# Patient Record
Sex: Female | Born: 1990 | Race: White | Hispanic: No | Marital: Single | Smoking: Never smoker
Health system: Southern US, Community
[De-identification: ages and names within clinical notes are randomized; demographics above are authoritative.]

## PROBLEM LIST (undated history)

## (undated) ENCOUNTER — Inpatient Hospital Stay (HOSPITAL_COMMUNITY): Payer: Self-pay

## (undated) DIAGNOSIS — Q5128 Other doubling of uterus, other specified: Secondary | ICD-10-CM

## (undated) DIAGNOSIS — K635 Polyp of colon: Secondary | ICD-10-CM

## (undated) DIAGNOSIS — O139 Gestational [pregnancy-induced] hypertension without significant proteinuria, unspecified trimester: Secondary | ICD-10-CM

## (undated) DIAGNOSIS — O24419 Gestational diabetes mellitus in pregnancy, unspecified control: Secondary | ICD-10-CM

## (undated) HISTORY — DX: Gestational diabetes mellitus in pregnancy, unspecified control: O24.419

## (undated) HISTORY — DX: Polyp of colon: K63.5

## (undated) HISTORY — PX: CERVIX SURGERY: SHX593

---
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
## 2012-04-09 ENCOUNTER — Emergency Department: Payer: Self-pay | Admitting: Emergency Medicine

## 2012-04-17 ENCOUNTER — Ambulatory Visit: Payer: Self-pay | Admitting: Family Medicine

## 2012-04-21 ENCOUNTER — Emergency Department: Payer: Self-pay | Admitting: Emergency Medicine

## 2012-04-21 LAB — COMPREHENSIVE METABOLIC PANEL
Alkaline Phosphatase: 139 U/L (ref 82–169)
BUN: 13 mg/dL (ref 7–18)
Bilirubin,Total: 0.4 mg/dL (ref 0.2–1.0)
Chloride: 107 mmol/L (ref 98–107)
Creatinine: 0.61 mg/dL (ref 0.60–1.30)
EGFR (Non-African Amer.): >60
Glucose: 103 mg/dL — ABNORMAL HIGH (ref 65–99)
Osmolality: 276 (ref 275–301)
Potassium: 3.8 mmol/L (ref 3.5–5.1)
SGOT(AST): 21 U/L (ref 0–26)
Sodium: 138 mmol/L (ref 136–145)

## 2012-04-21 LAB — CBC
MCHC: 34.4 g/dL (ref 32.0–36.0)
Platelet: 379 10*3/uL (ref 150–440)
RDW: 13.7 % (ref 11.5–14.5)

## 2012-04-21 LAB — URINALYSIS, COMPLETE
Bacteria: NONE SEEN
Bilirubin,UR: NEGATIVE
Nitrite: NEGATIVE
Specific Gravity: 1.023 (ref 1.003–1.030)
Squamous Epithelial: 3

## 2012-04-21 LAB — PREGNANCY, URINE: Pregnancy Test, Urine: NEGATIVE m[IU]/mL

## 2012-05-13 ENCOUNTER — Ambulatory Visit: Payer: Self-pay | Admitting: Obstetrics and Gynecology

## 2012-05-13 LAB — CBC
HCT: 38.6 % (ref 35.0–47.0)
MCV: 84 fL (ref 80–100)
Platelet: 326 10*3/uL (ref 150–440)

## 2012-05-13 LAB — PREGNANCY, URINE: Pregnancy Test, Urine: NEGATIVE m[IU]/mL

## 2012-05-20 ENCOUNTER — Ambulatory Visit: Payer: Self-pay | Admitting: Obstetrics and Gynecology

## 2013-03-06 ENCOUNTER — Emergency Department: Payer: Self-pay | Admitting: Emergency Medicine

## 2013-03-31 ENCOUNTER — Ambulatory Visit: Payer: Self-pay | Admitting: Orthopedic Surgery

## 2014-01-17 ENCOUNTER — Emergency Department: Payer: Self-pay | Admitting: Internal Medicine

## 2014-01-17 LAB — URINALYSIS, COMPLETE
BLOOD: NEGATIVE
Bilirubin,UR: NEGATIVE
Glucose,UR: NEGATIVE mg/dL (ref 0–75)
Hyaline Cast: 6
Ketone: NEGATIVE
Leukocyte Esterase: NEGATIVE
NITRITE: NEGATIVE
Ph: 5 (ref 4.5–8.0)
Specific Gravity: 1.015 (ref 1.003–1.030)
Squamous Epithelial: 5
WBC UR: 2 /HPF (ref 0–5)

## 2014-01-18 ENCOUNTER — Emergency Department: Payer: Self-pay | Admitting: Emergency Medicine

## 2014-04-27 ENCOUNTER — Emergency Department: Payer: Self-pay | Admitting: Emergency Medicine

## 2014-04-27 LAB — CBC WITH DIFFERENTIAL/PLATELET
BASOS ABS: 0 10*3/uL (ref 0.0–0.1)
Basophil %: 0.2 %
Eosinophil #: 0 10*3/uL (ref 0.0–0.7)
Eosinophil %: 0.3 %
HCT: 39.1 % (ref 35.0–47.0)
HGB: 13 g/dL (ref 12.0–16.0)
LYMPHS PCT: 21.1 %
Lymphocyte #: 4 10*3/uL — ABNORMAL HIGH (ref 1.0–3.6)
MCH: 29.1 pg (ref 26.0–34.0)
MCHC: 33.2 g/dL (ref 32.0–36.0)
MCV: 88 fL (ref 80–100)
Monocyte #: 1.1 x10 3/mm — ABNORMAL HIGH (ref 0.2–0.9)
Monocyte %: 5.6 %
NEUTROS ABS: 13.9 10*3/uL — AB (ref 1.4–6.5)
NEUTROS PCT: 72.8 %
Platelet: 353 10*3/uL (ref 150–440)
RBC: 4.47 10*6/uL (ref 3.80–5.20)
RDW: 13.2 % (ref 11.5–14.5)
WBC: 19.1 10*3/uL — AB (ref 3.6–11.0)

## 2014-04-27 LAB — URINALYSIS, COMPLETE
BILIRUBIN, UR: NEGATIVE
Glucose,UR: NEGATIVE mg/dL (ref 0–75)
Ketone: NEGATIVE
NITRITE: NEGATIVE
Ph: 7 (ref 4.5–8.0)
Protein: NEGATIVE
RBC,UR: 9 /HPF (ref 0–5)
SPECIFIC GRAVITY: 1.003 (ref 1.003–1.030)
WBC UR: 79 /HPF (ref 0–5)

## 2014-04-27 LAB — COMPREHENSIVE METABOLIC PANEL
ALBUMIN: 3.5 g/dL (ref 3.4–5.0)
ALK PHOS: 109 U/L
Anion Gap: 16 (ref 7–16)
BUN: 9 mg/dL (ref 7–18)
Bilirubin,Total: 0.3 mg/dL (ref 0.2–1.0)
CREATININE: 0.72 mg/dL (ref 0.60–1.30)
Calcium, Total: 9 mg/dL (ref 8.5–10.1)
Chloride: 107 mmol/L (ref 98–107)
Co2: 24 mmol/L (ref 21–32)
EGFR (African American): >60
Glucose: 74 mg/dL (ref 65–99)
Osmolality: 290 (ref 275–301)
Potassium: 3.9 mmol/L (ref 3.5–5.1)
SGOT(AST): 19 U/L (ref 15–37)
SGPT (ALT): 28 U/L
Sodium: 147 mmol/L — ABNORMAL HIGH (ref 136–145)
Total Protein: 7.8 g/dL (ref 6.4–8.2)

## 2014-07-21 IMAGING — US TRANSABDOMINAL ULTRASOUND OF PELVIS
1 series · 13 of 25 positions shown · non-contrast
Comparison: none

REASON FOR EXAM: pelvic pain
COMMENTS:

[Series 1: transabdominal ultrasound of pelvis · 0.23mm/px · 13 of 82 slices shown]
[im 1/82]
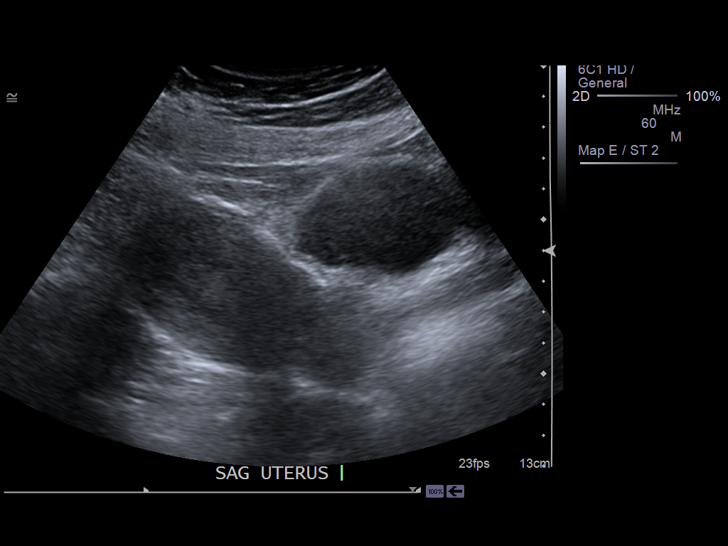
[im 7/82]
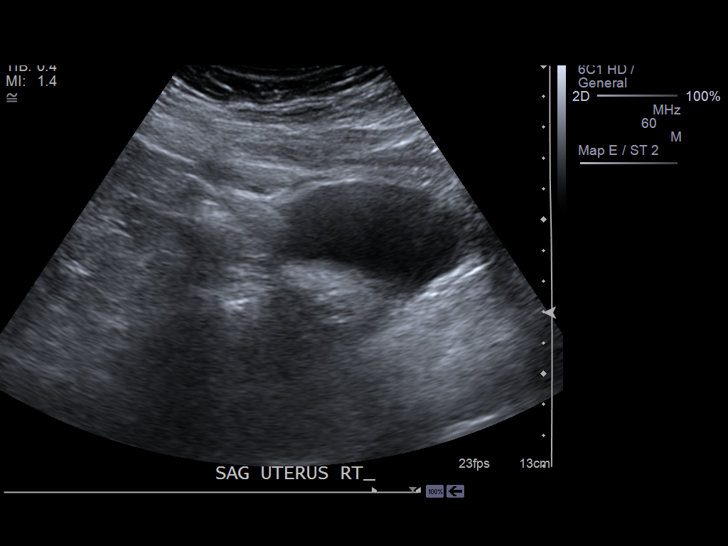
[im 14/82]
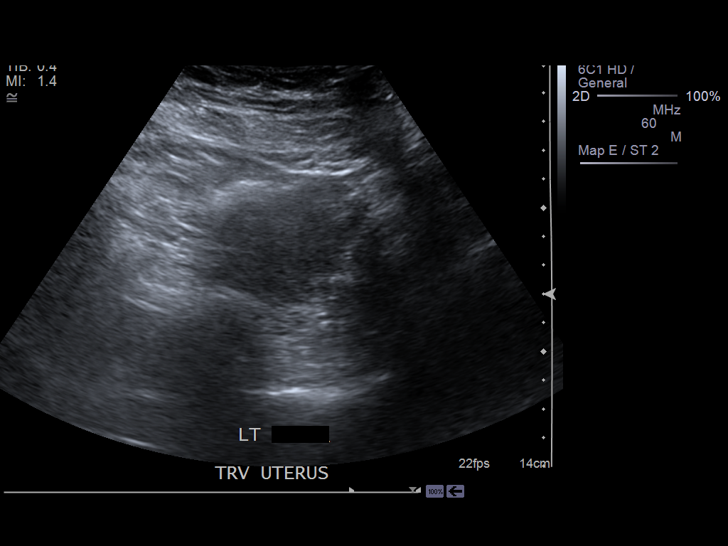
[im 21/82]
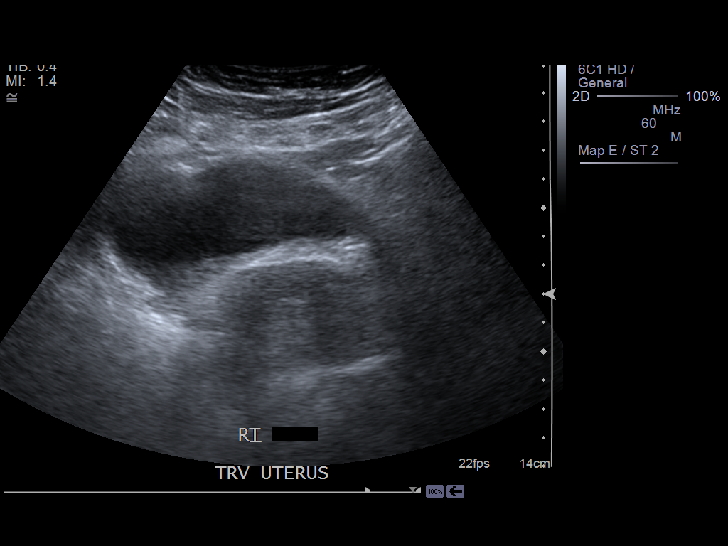
[im 28/82]
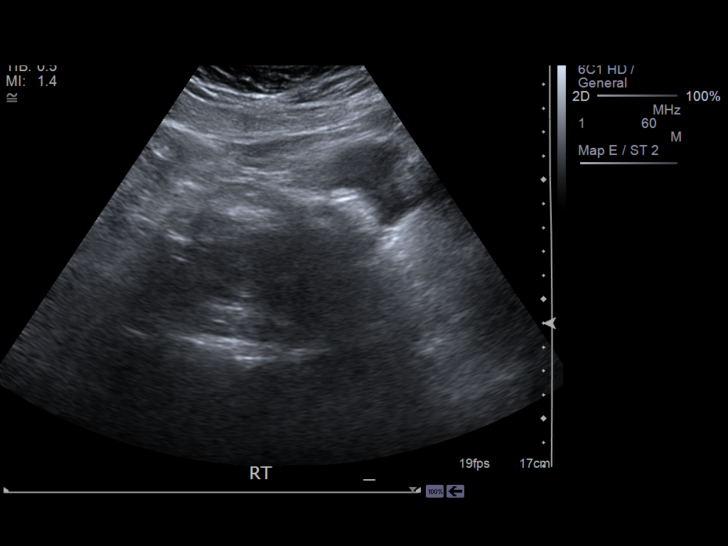
[im 34/82]
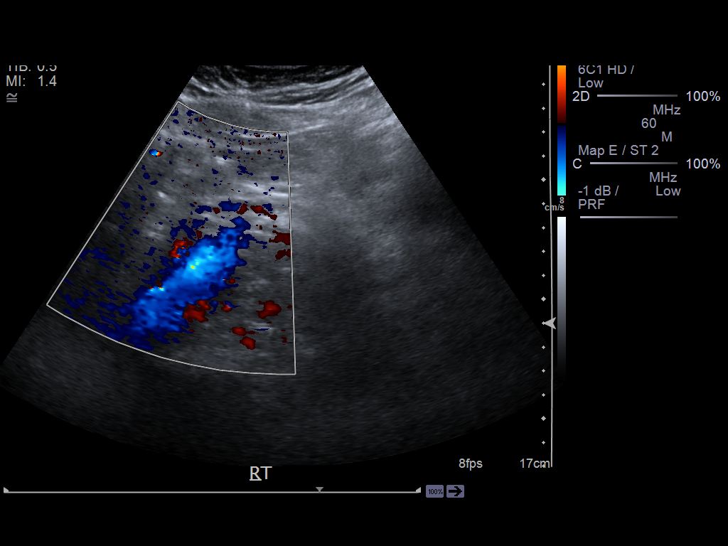
[im 41/82]
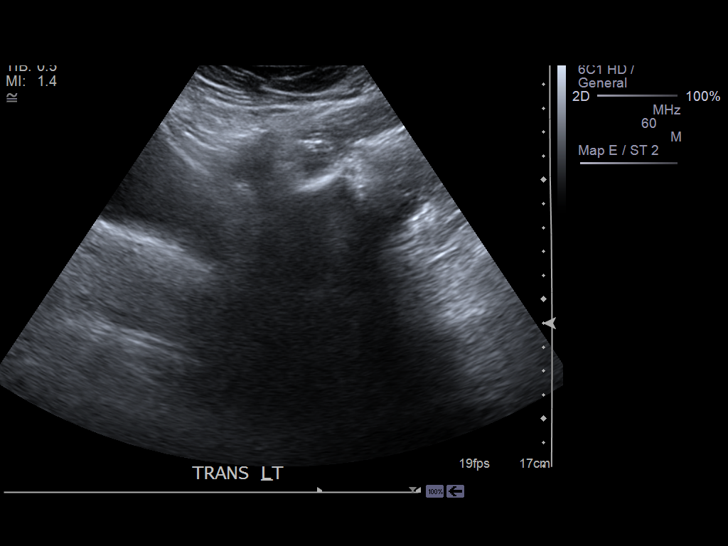
[im 48/82]
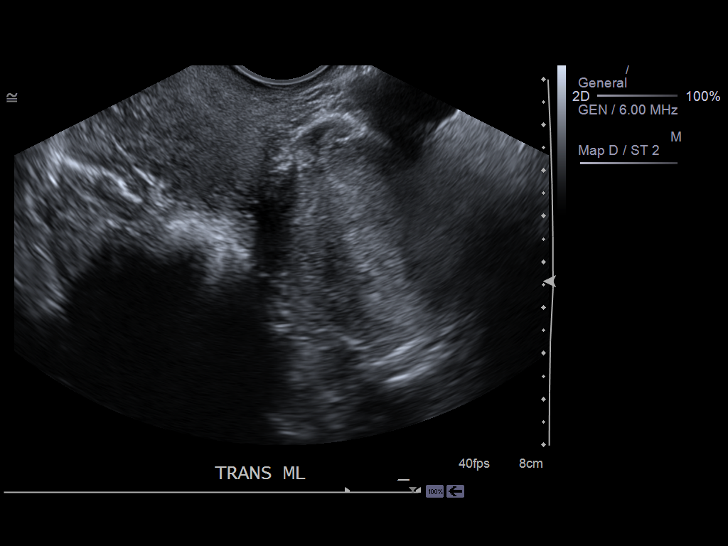
[im 55/82]
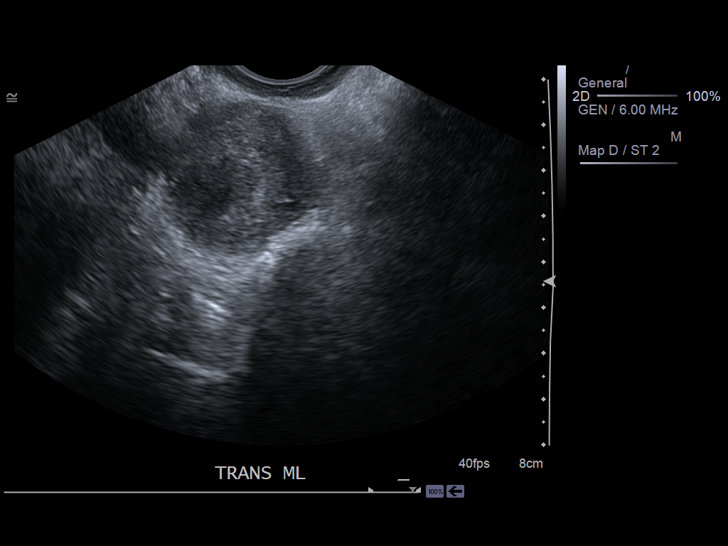
[im 61/82]
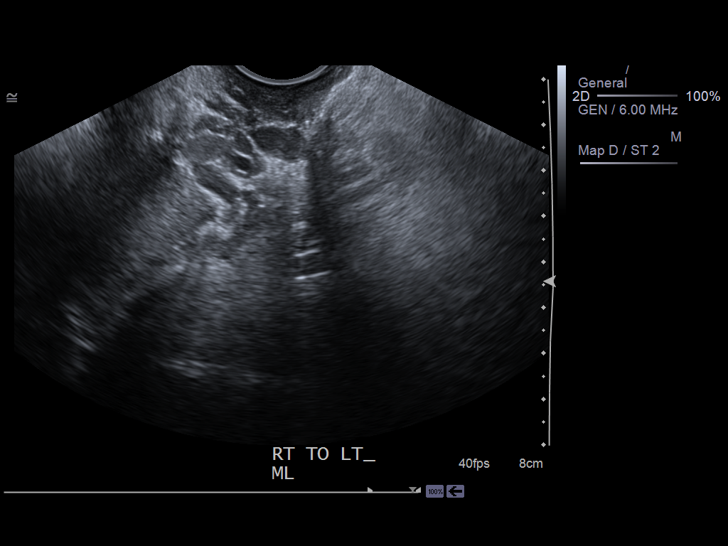
[im 68/82]
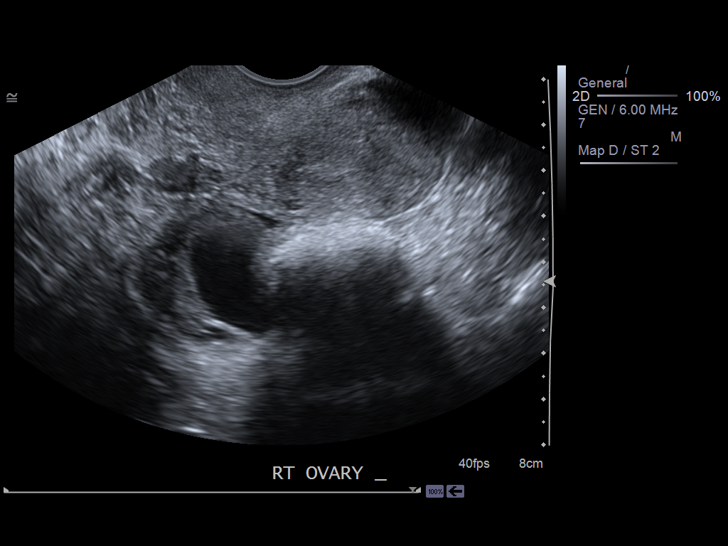
[im 75/82]
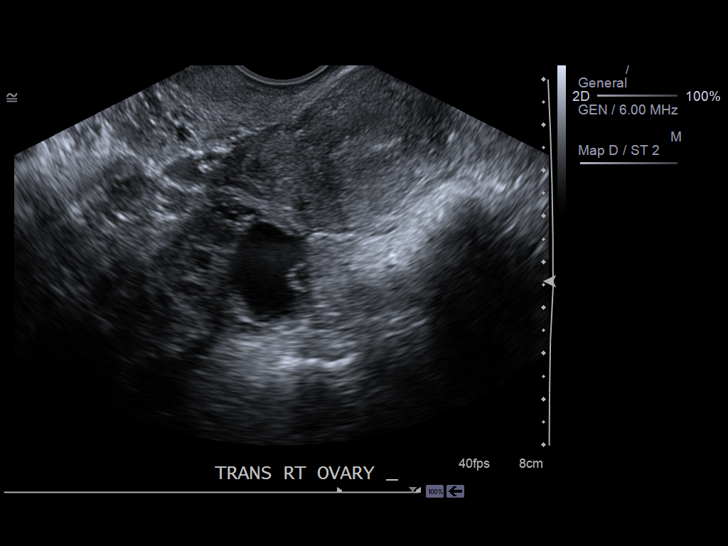
[im 82/82]
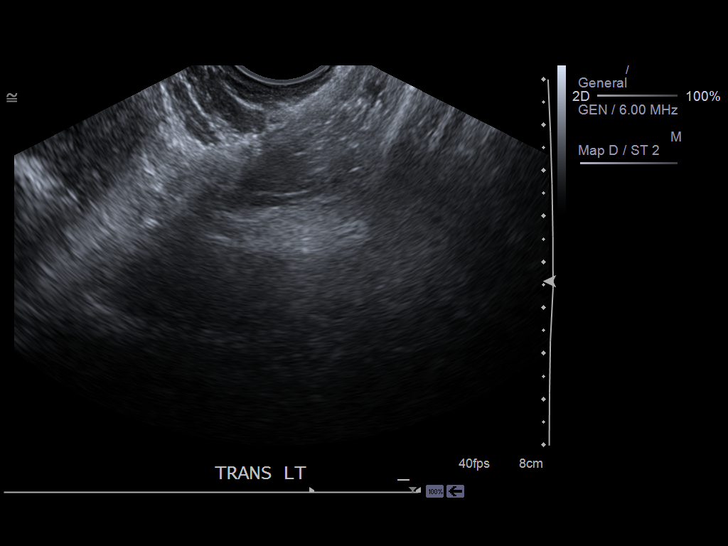

[13 of 25 positions shown; findings below may reference images not displayed]

PROCEDURE:     KUONG - KUONG PELVIS NON-OB W/TRANSVAGINAL  - April 17, 2012  [DATE]

RESULT:     Transabdominal and transvaginal imaging was performed. The
patient has a history of duplication of the uterus consistent with a uterine
didelphys by history.

The uterine horn on the right measures 8.6 x 6 x 5.7 cm. On the left it
measures 8.6 x 4.8 x 4.6 cm. The endometrial stripe measures 9.1 mm in
thickness. There is no free fluid in the cul-de-sac.

The left ovary could not be demonstrated. The right ovary measures 4.8 x
x 2.9 cm and contains a dominant cystic structure measuring 2 x 2.6 by
cm. This cystic structure does not appear hypervascular.
IMPRESSION: 1. The study is limited due to the patient's body habitus inability to
tolerate the transvaginal exam. The patient reportedly has duplication of
the uterus and 2 uterine horns are visible on this ultrasound. Only one
endometrial stripe that on the right, could be demonstrated it measured 9 mm.
2. The left ovary could not be demonstrated. The right ovary demonstrates a
cyst or developing follicle which measures 2.6 cm in greatest dimension.

[REDACTED]

## 2014-08-22 NOTE — Op Note (Signed)
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   PROCEDURE: Primary LUT cesarean section on the left uterus.    SURGEON: Delsa Sale, MD  ASSISTANT: Dr. Donzetta Matters   ESTIMATED BLOOD LOSS: 1000 mL.   FINDINGS: Term liveborn female infant weighing 7 pounds, 9 ounces with two distinct uteruses fused on the superficial outer aspect with two normal ovaries and tubes bilaterally.  DESCRIPTION OF PROCEDURE: Patient was taken to the Operating Room, placed in supine position. After adequate spinal anesthesia instilled, the patient was prepped and draped in the usual sterile fashion. Incision was drawn on the patient's abdomen. Timeout was performed. Skin incision was made in the Pfannenstiel fashion approximately three fingerbreadths above the pubic symphysis and carried sharply down to the fascia. Fascia was nicked in the midline and the incision was extended in a superolateral manner with the curved Mayo scissors. The superior fascia was grasped with Kocher clamps and the muscle bellies were sharply and bluntly dissected off the rectus muscles. In similar fashion the inferior muscle bellies and fascial were dissected off the muscle bellies. The muscle bellies were split in the midline. Peritoneum was grasped, sharply entered, incision extended with the Metzenbaum scissors. Bladder blade was placed. At this time abdominal survey was performed with the surgeon's hand to find orientation of the uterus, the ovaries, the tubes and was found to have a small right-sided uterus of approximately 4 to 6 inches adherent to the sidewall of the larger uterus encompassing the baby. At this time patient was known that there were two cervices.  The uterus was shifted towards the patient's left and the lower uterine segment of the right uterine horn was entered in the lower uterine segment. The incision was extended with bandage scissors in a superolateral manner so as to avoid the left uterus. The infant's head was identified. The amniotic sac was ruptured. The infant's head was delivered. Infant was bulb suctioned and the infant's cord was clamped and cut, handed off to the awaiting pediatrician. Cord blood was obtained. Pitocin was started. Placenta was delivered. Photographs were taken of the uterus to show the patient and to place in her chart. The patient's uterus was then grasped at the incision edges with pennington clamps. A small area of abrasion on the left uterine horn was closed with a running locked chromic suture. The uterine incision was then closed with a running locked chromic and an imbricating chromic, 2-0 chromic was used to tack the bladder flap back up to the uterine incision. The belly was cleared of clots and irrigated. The uterus was placed back into the abdomen. The clots in the gutters were removed. Seprafilm was placed across the uterine incision. The muscle bellies were approximated with a Vicryl suture in a running fashion. The On-Q pain pump was then placed through the superior fascia and muscle belly. The uterine incision was closed in one running Vicryl suture. Subcutaneous fat stitch of plain gut suture was then placed to approximate Camper's and Scarpa's fascia and skin clips were placed on the skin to approximate the skin edges. The On-Q pain pump was then primed. 2 x 2's and 4 x 4's were then used to cover the skin where the catheters were wound.. Tegaderm was placed.  Patient was then laid supine with clear urine noted in the Foley bag.   ____________________________ Delsa Sale, MD cck:cms D: 05/31/2011 23:44:09 ET T: 06/01/2011 11:33:28 ET JOB#: [id number]  cc: Delsa Sale, MD, <Dictator> Delsa Sale  MD ELECTRONICALLY SIGNED 06/05/2011 14:14
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
## 2014-09-07 NOTE — H&P (Signed)
L&D Evaluation:  History:   HPI 24 yo G1 whoae EDC = 06/07/11.  Pt scheduled cfor a primary CS 39 wks for a uterus didelphus.  patient presents after a ground-level fall where she hit her abdomen on her leg at about 720pm.  She notes +FM, lower abdominal pain and back pain, no vb, no lof.    Patient's Medical History No Chronic Illness    Patient's Surgical History none    Medications Pre Natal Vitamins    Allergies NKDA    Social History none   ROS:   ROS All systems were reviewed.  HEENT, CNS, GI, GU, Respiratory, CV, Renal and Musculoskeletal systems were found to be normal.   Exam:   Vital Signs stable    General no apparent distress    Mental Status clear    Chest clear    Heart normal sinus rhythm    Abdomen gravid, mildly tender to palpation all along lower abdomen. no rebound or guarding    Estimated Fetal Weight Average for gestational age    Pelvic Nitrazine neg    Mebranes Intact    FHT normal rate with no decels    FHT Description 140/mod var/+accels/no decels    Ucx absent   Impression:   Impression ground-level fall with abdomen hitting leg   Plan:   Plan EFM/NST, monitor contractions and for cervical change    Comments Monitored patient until 6 hours after her fall.  The fetal tracing was reactive. She is discharged home with precautions and instructions to follow up with her appointment later today.    Follow Up Appointment already scheduled   Electronic Signatures: Will Bonnet (MD)  (Signed 17-Jan-13 07:18)  Authored: L&D Evaluation   Last Updated: 17-Jan-13 07:18 by Will Bonnet (MD)

## 2015-02-03 ENCOUNTER — Ambulatory Visit: Admission: RE | Admit: 2015-02-03 | Payer: Self-pay | Source: Ambulatory Visit | Admitting: *Deleted

## 2016-04-21 ENCOUNTER — Emergency Department: Payer: Self-pay

## 2016-04-21 ENCOUNTER — Inpatient Hospital Stay
Admission: EM | Admit: 2016-04-21 | Discharge: 2016-04-22 | DRG: 418 | Disposition: A | Discharge status: Home / Self Care | Payer: Self-pay | Attending: Surgery | Admitting: Surgery

## 2016-04-21 ENCOUNTER — Encounter: Payer: Self-pay | Admitting: Emergency Medicine

## 2016-04-21 DIAGNOSIS — Z6841 Body Mass Index (BMI) 40.0 and over, adult: Secondary | ICD-10-CM

## 2016-04-21 DIAGNOSIS — K8000 Calculus of gallbladder with acute cholecystitis without obstruction: Secondary | ICD-10-CM

## 2016-04-21 DIAGNOSIS — K819 Cholecystitis, unspecified: Secondary | ICD-10-CM

## 2016-04-21 HISTORY — DX: Calculus of gallbladder with acute cholecystitis without obstruction: K80.00

## 2016-04-21 LAB — COMPREHENSIVE METABOLIC PANEL
ALK PHOS: 92 U/L (ref 38–126)
ALT: 30 U/L (ref 14–54)
AST: 36 U/L (ref 15–41)
Albumin: 4 g/dL (ref 3.5–5.0)
Anion gap: 8 (ref 5–15)
BUN: 9 mg/dL (ref 6–20)
CALCIUM: 9 mg/dL (ref 8.9–10.3)
CO2: 24 mmol/L (ref 22–32)
CREATININE: 0.53 mg/dL (ref 0.44–1.00)
Chloride: 105 mmol/L (ref 101–111)
Glucose, Bld: 101 mg/dL — ABNORMAL HIGH (ref 65–99)
Potassium: 3.8 mmol/L (ref 3.5–5.1)
Sodium: 137 mmol/L (ref 135–145)
Total Bilirubin: 0.7 mg/dL (ref 0.3–1.2)
Total Protein: 7.6 g/dL (ref 6.5–8.1)

## 2016-04-21 LAB — URINALYSIS, COMPLETE (UACMP) WITH MICROSCOPIC
Bilirubin Urine: NEGATIVE
GLUCOSE, UA: NEGATIVE mg/dL
Hgb urine dipstick: NEGATIVE
KETONES UR: NEGATIVE mg/dL
Leukocytes, UA: NEGATIVE
Nitrite: NEGATIVE
Protein, ur: NEGATIVE mg/dL
SPECIFIC GRAVITY, URINE: 1.017 (ref 1.005–1.030)
pH: 5 (ref 5.0–8.0)

## 2016-04-21 LAB — CBC
HCT: 37 % (ref 35.0–47.0)
Hemoglobin: 12.8 g/dL (ref 12.0–16.0)
MCH: 29.7 pg (ref 26.0–34.0)
MCHC: 34.7 g/dL (ref 32.0–36.0)
MCV: 85.7 fL (ref 80.0–100.0)
PLATELETS: 405 10*3/uL (ref 150–440)
RBC: 4.32 MIL/uL (ref 3.80–5.20)
RDW: 13.4 % (ref 11.5–14.5)
WBC: 14.2 10*3/uL — AB (ref 3.6–11.0)

## 2016-04-21 LAB — LIPASE, BLOOD: LIPASE: 16 U/L (ref 11–51)

## 2016-04-21 LAB — POCT PREGNANCY, URINE: Preg Test, Ur: NEGATIVE

## 2016-04-21 MED ORDER — LACTATED RINGERS IV SOLN
125.0000 mL/h | INTRAVENOUS | Status: DC
  Administered 2016-04-22 (×2): 125 mL/h via INTRAVENOUS
  Administered 2016-04-22: 08:00:00 via INTRAVENOUS

## 2016-04-21 MED ORDER — ONDANSETRON HCL 4 MG/2ML IJ SOLN
4.0000 mg | Freq: Once | INTRAMUSCULAR | Status: AC | PRN
  Administered 2016-04-21: 4 mg via INTRAVENOUS
  Filled 2016-04-21: qty 2

## 2016-04-21 MED ORDER — ONDANSETRON HCL 4 MG/2ML IJ SOLN
4.0000 mg | Freq: Four times a day (QID) | INTRAMUSCULAR | Status: DC | PRN
  Administered 2016-04-22 (×2): 4 mg via INTRAVENOUS
  Filled 2016-04-21: qty 2

## 2016-04-21 MED ORDER — KETOROLAC TROMETHAMINE 30 MG/ML IJ SOLN
30.0000 mg | Freq: Four times a day (QID) | INTRAMUSCULAR | Status: DC
  Administered 2016-04-22 (×5): 30 mg via INTRAVENOUS
  Filled 2016-04-21 (×4): qty 1

## 2016-04-21 MED ORDER — MORPHINE SULFATE (PF) 4 MG/ML IV SOLN
4.0000 mg | Freq: Once | INTRAVENOUS | Status: AC
  Administered 2016-04-21: 4 mg via INTRAVENOUS
  Filled 2016-04-21: qty 1

## 2016-04-21 MED ORDER — ENOXAPARIN SODIUM 40 MG/0.4ML ~~LOC~~ SOLN
40.0000 mg | SUBCUTANEOUS | Status: DC
  Administered 2016-04-22: 40 mg via SUBCUTANEOUS
  Filled 2016-04-21 (×2): qty 0.4

## 2016-04-21 MED ORDER — POLYETHYLENE GLYCOL 3350 17 G PO PACK: 17.0000 g | PACK | Freq: Every day | ORAL | Status: DC | PRN

## 2016-04-21 MED ORDER — OXYCODONE HCL 5 MG PO TABS
5.0000 mg | ORAL_TABLET | ORAL | Status: DC | PRN
  Administered 2016-04-22 (×2): 5 mg via ORAL
  Filled 2016-04-21 (×2): qty 1

## 2016-04-21 MED ORDER — SODIUM CHLORIDE 0.9 % IV BOLUS (SEPSIS)
1000.0000 mL | Freq: Once | INTRAVENOUS | Status: AC
  Administered 2016-04-22: 1000 mL via INTRAVENOUS

## 2016-04-21 MED ORDER — HYDROMORPHONE HCL 1 MG/ML IJ SOLN
0.5000 mg | INTRAMUSCULAR | Status: DC | PRN
  Administered 2016-04-22: 0.5 mg via INTRAVENOUS
  Filled 2016-04-21: qty 0.5

## 2016-04-21 MED ORDER — PANTOPRAZOLE SODIUM 40 MG IV SOLR
40.0000 mg | Freq: Every day | INTRAVENOUS | Status: DC
  Administered 2016-04-22: 40 mg via INTRAVENOUS
  Filled 2016-04-21 (×2): qty 40

## 2016-04-21 MED ORDER — ACETAMINOPHEN 500 MG PO TABS
1000.0000 mg | ORAL_TABLET | Freq: Four times a day (QID) | ORAL | Status: DC
  Administered 2016-04-22 (×3): 1000 mg via ORAL
  Filled 2016-04-21 (×4): qty 2

## 2016-04-21 MED ORDER — ONDANSETRON HCL 4 MG/2ML IJ SOLN
4.0000 mg | Freq: Once | INTRAMUSCULAR | Status: AC
  Administered 2016-04-21: 4 mg via INTRAVENOUS
  Filled 2016-04-21: qty 2

## 2016-04-21 MED ORDER — DEXTROSE 5 % IV SOLN
2.0000 g | Freq: Three times a day (TID) | INTRAVENOUS | Status: DC
  Administered 2016-04-22 (×4): 2 g via INTRAVENOUS
  Filled 2016-04-21 (×5): qty 2

## 2016-04-21 MED ORDER — ONDANSETRON 4 MG PO TBDP: 4.0000 mg | ORAL_TABLET | Freq: Four times a day (QID) | ORAL | Status: DC | PRN

## 2016-04-21 NOTE — ED Triage Notes (Signed)
Pt sates she has had RUQ abdominal pain for the past year but states the pain has worsened over the past several days and worse today. Pt states she has abdominal pain after meals and describes the pain as sharp and stabbing.  Pt states she was woken up by the pain.  Pt endorses nausea and vomiting x1, denies diarrhea.  Pain worse when lying down and radiates to back.

## 2016-04-21 NOTE — ED Notes (Signed)
Pt returned from US

## 2016-04-21 NOTE — ED Provider Notes (Signed)
[facility] Emergency Department Provider Note  Time seen: 8:12 PM  I have reviewed the triage vital signs and the nursing notes.   HISTORY  Chief Complaint Abdominal Pain    HPI Martha Potts is a 24 y.o. female who presents to the emergency department for intermittent right upper quadrant pain. According to the patient for the past 5 years she's been having intermittent right upper quadrant pain. States the episodes have been much more frequent this year, and fairly constant for the past one week. Patient states the pain is worse when eating or shortly after eating. States nausea, one episode of vomiting today. Denies diarrhea. Denies fever.Currently describes the pain as a 4 or 5/10 in the right upper quadrant, sharp pain.  History reviewed. No pertinent past medical history.  There are no active problems to display for this patient.   Past Surgical History:  Procedure Laterality Date  . CERVIX SURGERY    . CESAREAN SECTION      Prior to Admission medications   Not on File    No Known Allergies  History reviewed. No pertinent family history.  Social History Social History  Substance Use Topics  . Smoking status: Never Smoker  . Smokeless tobacco: Never Used  . Alcohol use No    Review of Systems Constitutional: Negative for fever. Cardiovascular: Negative for chest pain. Respiratory: Negative for shortness of breath. Gastrointestinal: Positive for right upper quadrant pain. Positive for nausea or vomiting. Negative for diarrhea. Neurological: Negative for headache 10-point ROS otherwise negative.  ____________________________________________   PHYSICAL EXAM:  VITAL SIGNS: ED Triage Vitals  Enc Vitals Group     BP 04/21/16 1840 (!) 114/56     Pulse Rate 04/21/16 1840 85     Resp 04/21/16 1840 20     Temp 04/21/16 1840 98.1 F (36.7 C)     Temp Source 04/21/16 1840 Oral     SpO2 04/21/16 1840 98 %     Weight 04/21/16 1834  (!) 315 lb (142.9 kg)     Height 04/21/16 1834 5\' 5"  (1.651 m)     Head Circumference --      Peak Flow --      Pain Score 04/21/16 1834 9     Pain Loc --      Pain Edu? --      Excl. in [place]? --     Constitutional: Alert and oriented. Well appearing and in no distress. Eyes: Normal exam ENT   Head: Normocephalic and atraumatic   Mouth/Throat: Mucous membranes are moist. Cardiovascular: Normal rate, regular rhythm. No murmur Respiratory: Normal respiratory effort without tachypnea nor retractions. Breath sounds are clear  Gastrointestinal: Soft, minimal right upper quadrant tenderness palpation, otherwise benign abdomen. Obese. Musculoskeletal: Nontender with normal range of motion in all extremities.  Neurologic:  Normal speech and language. No gross focal neurologic deficits  Skin:  Skin is warm, dry and intact.  Psychiatric: Mood and affect are normal.   ____________________________________________    EKG  EKG reviewed and interpreted by myself shows normal sinus rhythm at 87 bpm, narrow QRS, normal axis, normal intervals, no concerning ST changes. Normal EKG.  ____________________________________________    RADIOLOGY  Ultrasound shows innumerable gallstones.  ____________________________________________   INITIAL IMPRESSION / ASSESSMENT AND PLAN / ED COURSE  Pertinent labs & imaging results that were available during my care of the patient were reviewed by me and considered in my medical decision making (see chart for details).  Patient presents  the emergency department with intermittent right upper quadrant pain for the past 5 years, worse over the last 1 year, more constant over the past one week. Patient's labs are largely within normal limits besides a leukocytosis of 14,000. LFTs are normal. Lipase is normal. We'll obtain a right upper quadrant ultrasound to further evaluate. Patient's symptoms are very suggestive of biliary colic.  Many gallstones on the  ultrasound. Given the patient's elevated white blood cell count of 14,000 with continued right upper quadrant discomfort I have consult to Gen. surgery will be down to see the patient.  Gen. surgery has seen the patient and will be admitting him for presumed cholecystitis given her elevated white blood cell count and continued upper abdominal pain.  ____________________________________________   FINAL CLINICAL IMPRESSION(S) / ED DIAGNOSES  Right upper quadrant pain Biliary colic Cholecystitis   Harvest Dark, MD 04/21/16 2315

## 2016-04-21 NOTE — H&P (Signed)
Date of Admission:  04/21/2016  Reason for Admission:  Acute cholecystitis  History of Present Illness: Martha Potts is a 25 y.o. female who presents with a one-day history of persistent right upper quadrant and epigastric abdominal pain. The patient reports that she has had very frequent intermittent episodes of right upper quadrant pain over the course of this week associated with nausea and pain that radiates towards the back. Today however the pain was much more persistent with none to minimal time of relief. The pain was worse with any oral intake and the patient has had a lot of nausea and did have an episode of emesis today. The patient denies any fevers but reports having some chills today. Denies having any chest pain or shortness of breath. Denies other areas of abdominal pain. This reports having some alternating diarrhea and constipation but reports that this is something that she's been dealing with for a long time.  Of note the patient reports that she's had similar type of pain over the course of 5 years which would be occasional only and short lived during each episode. However over the course of the years the episodes have become more frequent and particularly this year they have become more frequent progressing all the way to today.  Past Medical History: History reviewed. No pertinent past medical history.   Past Surgical History: Past Surgical History:  Procedure Laterality Date  . CERVIX SURGERY    . CESAREAN SECTION      Home Medications: Prior to Admission medications   None    Allergies: No Known Allergies  Social History:  reports that she has never smoked. She has never used smokeless tobacco. She reports that she does not drink alcohol or use drugs.   Family History: History reviewed. No pertinent family history.  Review of Systems: Review of Systems  Constitutional: Positive for chills. Negative for fever.  HENT: Negative for hearing loss.    Eyes: Negative for blurred vision.  Respiratory: Negative for cough and shortness of breath.   Cardiovascular: Negative for chest pain and leg swelling.  Gastrointestinal: Positive for abdominal pain, nausea and vomiting. Negative for constipation, diarrhea and heartburn.  Genitourinary: Negative for dysuria and hematuria.  Musculoskeletal: Negative for myalgias.  Skin: Negative for rash.  Neurological: Positive for focal weakness. Negative for dizziness.  Psychiatric/Behavioral: Negative for depression.    Physical Exam BP 138/88 (BP Location: Left Wrist)   Pulse 79   Temp 98.1 F (36.7 C) (Oral)   Resp 18   Ht 5\' 5"  (1.651 m)   Wt (!) 142.9 kg (315 lb)   LMP 03/30/2016 (Approximate) Comment: Irregular periods due to implant  SpO2 99%   BMI 52.42 kg/m  CONSTITUTIONAL: No acute distress. HEENT:  Normocephalic, atraumatic, extraocular motion intact. NECK: Trachea is midline, and there is no jugular venous distension. RESPIRATORY:  Lungs are clear, and breath sounds are equal bilaterally. Normal respiratory effort without pathologic use of accessory muscles. CARDIOVASCULAR: Heart is regular without murmurs, gallops, or rubs. GI: The abdomen is soft, obese, non-distended, with tenderness to palpation in the right upper quadrant and epigastric area.  Positive Murphy's sign. MUSCULOSKELETAL:  Normal muscle strength and tone in all four extremities.  No peripheral edema or cyanosis. SKIN: Skin turgor is normal. There are no pathologic skin lesions.  NEUROLOGIC:  Motor and sensation is grossly normal.  Cranial nerves are grossly intact. PSYCH:  Alert and oriented to person, place and time. Affect is normal.  Laboratory Analysis: Results  for orders placed or performed during the hospital encounter of 04/21/16 (from the past 24 hour(s))  Lipase, blood     Status: None   Collection Time: 04/21/16  6:43 PM  Result Value Ref Range   Lipase 16 11 - 51 U/L  Comprehensive metabolic panel      Status: Abnormal   Collection Time: 04/21/16  6:43 PM  Result Value Ref Range   Sodium 137 135 - 145 mmol/L   Potassium 3.8 3.5 - 5.1 mmol/L   Chloride 105 101 - 111 mmol/L   CO2 24 22 - 32 mmol/L   Glucose, Bld 101 (H) 65 - 99 mg/dL   BUN 9 6 - 20 mg/dL   Creatinine, Ser 0.53 0.44 - 1.00 mg/dL   Calcium 9.0 8.9 - 10.3 mg/dL   Total Protein 7.6 6.5 - 8.1 g/dL   Albumin 4.0 3.5 - 5.0 g/dL   AST 36 15 - 41 U/L   ALT 30 14 - 54 U/L   Alkaline Phosphatase 92 38 - 126 U/L   Total Bilirubin 0.7 0.3 - 1.2 mg/dL   GFR calc non Af Amer >60 >60 mL/min   GFR calc Af Amer >60 >60 mL/min   Anion gap 8 5 - 15  CBC     Status: Abnormal   Collection Time: 04/21/16  6:43 PM  Result Value Ref Range   WBC 14.2 (H) 3.6 - 11.0 K/uL   RBC 4.32 3.80 - 5.20 MIL/uL   Hemoglobin 12.8 12.0 - 16.0 g/dL   HCT 37.0 35.0 - 47.0 %   MCV 85.7 80.0 - 100.0 fL   MCH 29.7 26.0 - 34.0 pg   MCHC 34.7 32.0 - 36.0 g/dL   RDW 13.4 11.5 - 14.5 %   Platelets 405 150 - 440 K/uL  Urinalysis, Complete w Microscopic     Status: Abnormal   Collection Time: 04/21/16  6:43 PM  Result Value Ref Range   Color, Urine YELLOW (A) YELLOW   APPearance HAZY (A) CLEAR   Specific Gravity, Urine 1.017 1.005 - 1.030   pH 5.0 5.0 - 8.0   Glucose, UA NEGATIVE NEGATIVE mg/dL   Hgb urine dipstick NEGATIVE NEGATIVE   Bilirubin Urine NEGATIVE NEGATIVE   Ketones, ur NEGATIVE NEGATIVE mg/dL   Protein, ur NEGATIVE NEGATIVE mg/dL   Nitrite NEGATIVE NEGATIVE   Leukocytes, UA NEGATIVE NEGATIVE   RBC / HPF 0-5 0 - 5 RBC/hpf   WBC, UA 0-5 0 - 5 WBC/hpf   Bacteria, UA RARE (A) NONE SEEN   Squamous Epithelial / LPF 0-5 (A) NONE SEEN   Mucous PRESENT   Pregnancy, urine POC     Status: None   Collection Time: 04/21/16  7:02 PM  Result Value Ref Range   Preg Test, Ur NEGATIVE NEGATIVE    Imaging: US Abdomen Limited Ruq  Result Date: 04/21/2016 CLINICAL DATA:  Right upper quadrant pain for 1 year which has worsened over the past  several days. EXAM: US ABDOMEN LIMITED - RIGHT UPPER QUADRANT COMPARISON:  None. FINDINGS: Gallbladder: The gallbladder is contracted with innumerable small stones identified measuring up to 0.5 cm. Most of the stones are not visible due to shadowing. No gallbladder wall thickening or pericholecystic fluid. Sonographer reports negative Murphy's sign. Common bile duct: Diameter: 0.2 cm. Liver: Fatty infiltration without focal lesion or intrahepatic biliary ductal dilatation. IMPRESSION: Innumerable stones within a contracted gallbladder. No ultrasound evidence of acute cholecystitis. Fatty infiltration of the liver. Electronically Signed   By: Marcello Moores  Dalessio M.D.   On: 04/21/2016 21:40    Assessment and Plan: This is a 25 y.o. female who presents with likely acute on chronic cholecystitis. I have personally reviewed the patient's ultrasound and laboratory studies. She does have a white blood cell count of 14.2 and ultrasound with a contracted gallbladder that is full of stones. On exam she does have a positive Murphy's sign.  -We'll admit the patient to the general surgery service. -Start IV antibiotics, IV fluids, and nausea/pain medications. -We'll keep the patient nothing by mouth in anticipation for possible surgery tomorrow with my partner Dr. Adonis Huguenin. Have discussed with the patient the role of laparoscopic versus open cholecystectomy. -The patient understands this plan and all of her questions have been answered.   Melvyn Neth, [place]

## 2016-04-22 ENCOUNTER — Inpatient Hospital Stay: Payer: Self-pay | Admitting: Anesthesiology

## 2016-04-22 ENCOUNTER — Encounter
Admission: EM | Disposition: A | Discharge status: Home / Self Care | Payer: Self-pay | Source: Home / Self Care | Attending: Surgery

## 2016-04-22 DIAGNOSIS — K8000 Calculus of gallbladder with acute cholecystitis without obstruction: Principal | ICD-10-CM

## 2016-04-22 HISTORY — PX: CHOLECYSTECTOMY: SHX55

## 2016-04-22 LAB — CBC
HCT: 35.6 % (ref 35.0–47.0)
Hemoglobin: 12 g/dL (ref 12.0–16.0)
MCH: 28.9 pg (ref 26.0–34.0)
MCHC: 33.8 g/dL (ref 32.0–36.0)
MCV: 85.4 fL (ref 80.0–100.0)
PLATELETS: 349 10*3/uL (ref 150–440)
RBC: 4.16 MIL/uL (ref 3.80–5.20)
RDW: 13.4 % (ref 11.5–14.5)
WBC: 11.1 10*3/uL — ABNORMAL HIGH (ref 3.6–11.0)

## 2016-04-22 LAB — BASIC METABOLIC PANEL
Anion gap: 6 (ref 5–15)
BUN: 10 mg/dL (ref 6–20)
CALCIUM: 8.3 mg/dL — AB (ref 8.9–10.3)
CO2: 23 mmol/L (ref 22–32)
CREATININE: 0.59 mg/dL (ref 0.44–1.00)
Chloride: 108 mmol/L (ref 101–111)
GFR calc Af Amer: >60 mL/min (ref >60)
GFR calc non Af Amer: >60 mL/min (ref >60)
GLUCOSE: 86 mg/dL (ref 65–99)
Potassium: 3.7 mmol/L (ref 3.5–5.1)
Sodium: 137 mmol/L (ref 135–145)

## 2016-04-22 LAB — SURGICAL PCR SCREEN
MRSA, PCR: NEGATIVE
Staphylococcus aureus: NEGATIVE

## 2016-04-22 LAB — MAGNESIUM: Magnesium: 1.8 mg/dL (ref 1.7–2.4)

## 2016-04-22 SURGERY — LAPAROSCOPIC CHOLECYSTECTOMY WITH INTRAOPERATIVE CHOLANGIOGRAM
Anesthesia: General | Wound class: Clean Contaminated

## 2016-04-22 MED ORDER — ONDANSETRON HCL 4 MG/2ML IJ SOLN: 4.0000 mg | Freq: Once | INTRAMUSCULAR | Status: DC | PRN

## 2016-04-22 MED ORDER — PROPOFOL 10 MG/ML IV BOLUS
INTRAVENOUS | Status: AC
Start: 2016-04-22 — End: 2016-04-22
  Filled 2016-04-22: qty 20

## 2016-04-22 MED ORDER — LIDOCAINE 2% (20 MG/ML) 5 ML SYRINGE
INTRAMUSCULAR | Status: AC
  Filled 2016-04-22: qty 5

## 2016-04-22 MED ORDER — FENTANYL CITRATE (PF) 100 MCG/2ML IJ SOLN
25.0000 ug | INTRAMUSCULAR | Status: DC | PRN
  Administered 2016-04-22 (×2): 25 ug via INTRAVENOUS

## 2016-04-22 MED ORDER — ROCURONIUM BROMIDE 50 MG/5ML IV SOSY
PREFILLED_SYRINGE | INTRAVENOUS | Status: AC
  Filled 2016-04-22: qty 5

## 2016-04-22 MED ORDER — ENOXAPARIN SODIUM 40 MG/0.4ML ~~LOC~~ SOLN
40.0000 mg | Freq: Two times a day (BID) | SUBCUTANEOUS | Status: DC
  Filled 2016-04-22: qty 0.4

## 2016-04-22 MED ORDER — LIDOCAINE HCL (CARDIAC) 20 MG/ML IV SOLN
INTRAVENOUS | Status: DC | PRN
  Administered 2016-04-22: 100 mg via INTRAVENOUS

## 2016-04-22 MED ORDER — SUCCINYLCHOLINE CHLORIDE 200 MG/10ML IV SOSY
PREFILLED_SYRINGE | INTRAVENOUS | Status: AC
  Filled 2016-04-22: qty 10

## 2016-04-22 MED ORDER — ROCURONIUM BROMIDE 100 MG/10ML IV SOLN
INTRAVENOUS | Status: DC | PRN
Start: 2016-04-22 — End: 2016-04-22
  Administered 2016-04-22: 50 mg via INTRAVENOUS

## 2016-04-22 MED ORDER — FENTANYL CITRATE (PF) 250 MCG/5ML IJ SOLN
INTRAMUSCULAR | Status: AC
  Filled 2016-04-22: qty 10

## 2016-04-22 MED ORDER — EPHEDRINE 5 MG/ML INJ
INTRAVENOUS | Status: AC
  Filled 2016-04-22: qty 10

## 2016-04-22 MED ORDER — DEXAMETHASONE SODIUM PHOSPHATE 10 MG/ML IJ SOLN
INTRAMUSCULAR | Status: DC | PRN
  Administered 2016-04-22: 20 mg via INTRAVENOUS

## 2016-04-22 MED ORDER — SUCCINYLCHOLINE CHLORIDE 20 MG/ML IJ SOLN
INTRAMUSCULAR | Status: DC | PRN
  Administered 2016-04-22: 140 mg via INTRAVENOUS
  Administered 2016-04-22: 60 mg via INTRAVENOUS

## 2016-04-22 MED ORDER — DEXAMETHASONE SODIUM PHOSPHATE 10 MG/ML IJ SOLN
INTRAMUSCULAR | Status: AC
  Filled 2016-04-22: qty 2

## 2016-04-22 MED ORDER — SUGAMMADEX SODIUM 500 MG/5ML IV SOLN
INTRAVENOUS | Status: AC
  Filled 2016-04-22: qty 5

## 2016-04-22 MED ORDER — ACETAMINOPHEN 10 MG/ML IV SOLN
INTRAVENOUS | Status: DC | PRN
  Administered 2016-04-22: 1000 mg via INTRAVENOUS

## 2016-04-22 MED ORDER — FENTANYL CITRATE (PF) 100 MCG/2ML IJ SOLN
INTRAMUSCULAR | Status: AC
  Administered 2016-04-22: 25 ug via INTRAVENOUS
  Filled 2016-04-22: qty 2

## 2016-04-22 MED ORDER — FENTANYL CITRATE (PF) 100 MCG/2ML IJ SOLN
INTRAMUSCULAR | Status: DC | PRN
  Administered 2016-04-22: 50 ug via INTRAVENOUS
  Administered 2016-04-22: 100 ug via INTRAVENOUS
  Administered 2016-04-22 (×2): 50 ug via INTRAVENOUS

## 2016-04-22 MED ORDER — PHENYLEPHRINE HCL 10 MG/ML IJ SOLN
INTRAMUSCULAR | Status: AC
  Filled 2016-04-22: qty 1

## 2016-04-22 MED ORDER — LIDOCAINE HCL (PF) 1 % IJ SOLN
INTRAMUSCULAR | Status: AC
  Filled 2016-04-22: qty 30

## 2016-04-22 MED ORDER — PROPOFOL 10 MG/ML IV BOLUS
INTRAVENOUS | Status: AC
  Filled 2016-04-22: qty 20

## 2016-04-22 MED ORDER — PHENOL 1.4 % MT LIQD
1.0000 | OROMUCOSAL | Status: DC | PRN
  Administered 2016-04-22: 1 via OROMUCOSAL
  Filled 2016-04-22: qty 177

## 2016-04-22 MED ORDER — PHENYLEPHRINE HCL 10 MG/ML IJ SOLN
INTRAMUSCULAR | Status: DC | PRN
  Administered 2016-04-22 (×2): 100 ug via INTRAVENOUS

## 2016-04-22 MED ORDER — PROPOFOL 10 MG/ML IV BOLUS
INTRAVENOUS | Status: DC | PRN
  Administered 2016-04-22: 200 mg via INTRAVENOUS

## 2016-04-22 MED ORDER — OXYCODONE HCL 5 MG PO TABS: 5.0000 mg | ORAL_TABLET | ORAL | 0 refills | Status: DC | PRN

## 2016-04-22 MED ORDER — ACETAMINOPHEN 10 MG/ML IV SOLN
INTRAVENOUS | Status: AC
  Filled 2016-04-22: qty 100

## 2016-04-22 MED ORDER — BUPIVACAINE HCL (PF) 0.5 % IJ SOLN
INTRAMUSCULAR | Status: AC
  Filled 2016-04-22: qty 30

## 2016-04-22 MED ORDER — KETOROLAC TROMETHAMINE 30 MG/ML IJ SOLN
INTRAMUSCULAR | Status: AC
  Filled 2016-04-22: qty 1

## 2016-04-22 MED ORDER — SUCCINYLCHOLINE CHLORIDE 200 MG/10ML IV SOSY
PREFILLED_SYRINGE | INTRAVENOUS | Status: AC
Start: 2016-04-22 — End: 2016-04-22
  Filled 2016-04-22: qty 10

## 2016-04-22 MED ORDER — CEFOXITIN SODIUM 2 G IV SOLR
INTRAVENOUS | Status: AC
  Filled 2016-04-22: qty 4

## 2016-04-22 MED ORDER — MIDAZOLAM HCL 2 MG/2ML IJ SOLN
INTRAMUSCULAR | Status: DC | PRN
  Administered 2016-04-22: 2 mg via INTRAVENOUS

## 2016-04-22 MED ORDER — LIDOCAINE HCL 1 % IJ SOLN
INTRAMUSCULAR | Status: DC | PRN
  Administered 2016-04-22: 26 mL via INTRAMUSCULAR

## 2016-04-22 MED ORDER — MIDAZOLAM HCL 2 MG/2ML IJ SOLN
INTRAMUSCULAR | Status: AC
  Filled 2016-04-22: qty 2

## 2016-04-22 MED ORDER — SUGAMMADEX SODIUM 200 MG/2ML IV SOLN
INTRAVENOUS | Status: DC | PRN
  Administered 2016-04-22: 282.4 mg via INTRAVENOUS

## 2016-04-22 SURGICAL SUPPLY — 44 items
ADHESIVE MASTISOL STRL (MISCELLANEOUS) ×3 IMPLANT
APPLIER CLIP ROT 10 11.4 M/L (STAPLE) ×3
BAG COUNTER SPONGE EZ (MISCELLANEOUS) IMPLANT
BLADE SURG SZ11 CARB STEEL (BLADE) ×3 IMPLANT
CANISTER SUCT 1200ML W/VALVE (MISCELLANEOUS) ×3 IMPLANT
CATH CHOLANG 76X19 KUMAR (CATHETERS) IMPLANT
CHLORAPREP W/TINT 26ML (MISCELLANEOUS) ×3 IMPLANT
CLIP APPLIE ROT 10 11.4 M/L (STAPLE) ×1 IMPLANT
CLOSURE WOUND 1/2 X4 (GAUZE/BANDAGES/DRESSINGS) ×1
CONRAY 60ML FOR OR (MISCELLANEOUS) IMPLANT
COUNTER SPONGE BAG EZ (MISCELLANEOUS)
DRAPE SHEET LG 3/4 BI-LAMINATE (DRAPES) IMPLANT
DRESSING TELFA 4X3 1S ST N-ADH (GAUZE/BANDAGES/DRESSINGS) ×3 IMPLANT
DRSG TEGADERM 2-3/8X2-3/4 SM (GAUZE/BANDAGES/DRESSINGS) ×12 IMPLANT
ELECT REM PT RETURN 9FT ADLT (ELECTROSURGICAL) ×3
ELECTRODE REM PT RTRN 9FT ADLT (ELECTROSURGICAL) ×1 IMPLANT
GLOVE BIO SURGEON STRL SZ7.5 (GLOVE) ×9 IMPLANT
GLOVE INDICATOR 8.0 STRL GRN (GLOVE) ×3 IMPLANT
GOWN STRL REUS W/ TWL LRG LVL3 (GOWN DISPOSABLE) ×3 IMPLANT
GOWN STRL REUS W/TWL LRG LVL3 (GOWN DISPOSABLE) ×6
GRASPER SUT TROCAR 14GX15 (MISCELLANEOUS) ×3 IMPLANT
IRRIGATION STRYKERFLOW (MISCELLANEOUS) ×1 IMPLANT
IRRIGATOR STRYKERFLOW (MISCELLANEOUS) ×3
IV NS 1000ML (IV SOLUTION) ×2
IV NS 1000ML BAXH (IV SOLUTION) ×1 IMPLANT
L-HOOK LAP DISP 36CM (ELECTROSURGICAL) ×3
LABEL OR SOLS (LABEL) ×3 IMPLANT
LHOOK LAP DISP 36CM (ELECTROSURGICAL) ×1 IMPLANT
NEEDLE HYPO 25X1 1.5 SAFETY (NEEDLE) ×3 IMPLANT
NEEDLE VERESS 14GA 120MM (NEEDLE) ×3 IMPLANT
NS IRRIG 1000ML POUR BTL (IV SOLUTION) ×3 IMPLANT
NS IRRIG 500ML POUR BTL (IV SOLUTION) IMPLANT
PACK LAP CHOLECYSTECTOMY (MISCELLANEOUS) ×3 IMPLANT
PENCIL ELECTRO HAND CTR (MISCELLANEOUS) ×3 IMPLANT
POUCH ENDO CATCH 10MM SPEC (MISCELLANEOUS) ×3 IMPLANT
SCISSORS METZENBAUM CVD 33 (INSTRUMENTS) ×3 IMPLANT
SLEEVE ENDOPATH XCEL 5M (ENDOMECHANICALS) ×6 IMPLANT
STRIP CLOSURE SKIN 1/2X4 (GAUZE/BANDAGES/DRESSINGS) ×2 IMPLANT
SUT MNCRL 4-0 (SUTURE) ×2
SUT MNCRL 4-0 27XMFL (SUTURE) ×1
SUTURE MNCRL 4-0 27XMF (SUTURE) ×1 IMPLANT
TROCAR XCEL 12X100 BLDLESS (ENDOMECHANICALS) ×3 IMPLANT
TROCAR XCEL NON-BLD 5MMX100MML (ENDOMECHANICALS) ×3 IMPLANT
TUBING INSUFFLATOR HI FLOW (MISCELLANEOUS) ×3 IMPLANT

## 2016-04-22 NOTE — Progress Notes (Signed)
Pt discharged to home. Pt had no complaints of nausea, vomiting or severe pain, tolerating diet. Pt ambulated several times around nurses station. Pt voiding with no issue, no BM noted. Pt husband noted that prescription for pain med has already been filled. Pt left unit on wheelchair accompanied by husband and RN.

## 2016-04-22 NOTE — ED Notes (Signed)
Pt transported to room 223 

## 2016-04-22 NOTE — ED Notes (Signed)
Pt. Able to ambulate in room to use bathroom.

## 2016-04-22 NOTE — Progress Notes (Signed)
Discharge instructions reviewed.

## 2016-04-22 NOTE — Anesthesia Postprocedure Evaluation (Signed)
Anesthesia Post Note  Patient: Martha Potts  Procedure(s) Performed: Procedure(s) (LRB): LAPAROSCOPIC CHOLECYSTECTOMY (N/A)  Patient location during evaluation: PACU Anesthesia Type: General Level of consciousness: awake Pain management: pain level controlled Vital Signs Assessment: post-procedure vital signs reviewed and stable Respiratory status: nonlabored ventilation Cardiovascular status: stable Anesthetic complications: no     Last Vitals:  Vitals:   04/22/16 1015 04/22/16 1016  BP: 117/90   Pulse: 79 77  Resp: 15 18  Temp:      Last Pain:  Vitals:   04/22/16 1016  TempSrc:   PainSc: 3                  VAN STAVEREN,Elyse Prevo

## 2016-04-22 NOTE — Discharge Instructions (Signed)
Laparoscopic Cholecystectomy, Care After °This sheet gives you information about how to care for yourself after your procedure. Your health care provider may also give you more specific instructions. If you have problems or questions, contact your health care provider. °What can I expect after the procedure? °After the procedure, it is common to have: °· Pain at your incision sites. You will be given medicines to control this pain. °· Mild nausea or vomiting. °· Bloating and possible shoulder pain from the air-like gas that was used during the procedure. °Follow these instructions at home: °Incision care  ° °· Follow instructions from your health care provider about how to take care of your incisions. Make sure you: °¨ Wash your hands with soap and water before you change your bandage (dressing). If soap and water are not available, use hand sanitizer. °¨ Change your dressing as told by your health care provider. °¨ Leave stitches (sutures), skin glue, or adhesive strips in place. These skin closures may need to be in place for 2 weeks or longer. If adhesive strip edges start to loosen and curl up, you may trim the loose edges. Do not remove adhesive strips completely unless your health care provider tells you to do that. °· Do not take baths, swim, or use a hot tub until your health care provider approves. Ask your health care provider if you can take showers. You may only be allowed to take sponge baths for bathing. °· Check your incision area every day for signs of infection. Check for: °¨ More redness, swelling, or pain. °¨ More fluid or blood. °¨ Warmth. °¨ Pus or a bad smell. °Activity  °· Do not drive or use heavy machinery while taking prescription pain medicine. °· Do not lift anything that is heavier than 10 lb (4.5 kg) until your health care provider approves. °· Do not play contact sports until your health care provider approves. °· Do not drive for 24 hours if you were given a medicine to help you relax  (sedative). °· Rest as needed. Do not return to work or school until your health care provider approves. °General instructions  °· Take over-the-counter and prescription medicines only as told by your health care provider. °· To prevent or treat constipation while you are taking prescription pain medicine, your health care provider may recommend that you: °¨ Drink enough fluid to keep your urine clear or pale yellow. °¨ Take over-the-counter or prescription medicines. °¨ Eat foods that are high in fiber, such as fresh fruits and vegetables, whole grains, and beans. °¨ Limit foods that are high in fat and processed sugars, such as fried and sweet foods. °Contact a health care provider if: °· You develop a rash. °· You have more redness, swelling, or pain around your incisions. °· You have more fluid or blood coming from your incisions. °· Your incisions feel warm to the touch. °· You have pus or a bad smell coming from your incisions. °· You have a fever. °· One or more of your incisions breaks open. °Get help right away if: °· You have trouble breathing. °· You have chest pain. °· You have increasing pain in your shoulders. °· You faint or feel dizzy when you stand. °· You have severe pain in your abdomen. °· You have nausea or vomiting that lasts for more than one day. °· You have leg pain. °This information is not intended to replace advice given to you by your health care provider. Make sure you discuss any   questions you have with your health care provider. °Document Released: 04/16/2005 Document Revised: 11/05/2015 Document Reviewed: 10/03/2015 °Elsevier Interactive Patient Education © 2017 [employer] ° °

## 2016-04-22 NOTE — Progress Notes (Signed)
Assessed pt per RN request. Pt complained of throat irritation and chest heaviness. O2 saturation on room air is 96%. Bilateral breath sounds are clear to diminished. Heart rate 95. Pt encouraged to use incentive spirometry and cough. Pt believes her throat irritation is from the tube used during her recent surgery. Pt encouraged to call should symptoms continue or any sudden changes. Dawn, RN updated on assessment.

## 2016-04-22 NOTE — Anesthesia Procedure Notes (Signed)
Procedure Name: Intubation Date/Time: 04/22/2016 8:44 AM Performed by: Nelda Marseille Pre-anesthesia Checklist: Patient identified, Patient being monitored, Timeout performed, Emergency Drugs available and Suction available Patient Re-evaluated:Patient Re-evaluated prior to inductionOxygen Delivery Method: Circle system utilized Preoxygenation: Pre-oxygenation with 100% oxygen Intubation Type: IV induction Ventilation: Mask ventilation without difficulty Laryngoscope Size: Mac and 3 Grade View: Grade IV Tube type: Oral Tube size: 7.0 mm Number of attempts: 3 Airway Equipment and Method: Stylet and Video-laryngoscopy Placement Confirmation: ETT inserted through vocal cords under direct vision,  positive ETCO2 and breath sounds checked- equal and bilateral Secured at: 21 cm Tube secured with: Tape Dental Injury: Teeth and Oropharynx as per pre-operative assessment  Difficulty Due To: Difficulty was anticipated, Difficult Airway- due to large tongue, Difficult Airway- due to reduced neck mobility, Difficult Airway-  due to edematous airway and Difficult Airway- due to limited oral opening Future Recommendations: Recommend- induction with short-acting agent, and alternative techniques readily available Comments: Used McGratjh for video layrngoscopy.  MDA [place] Service successful; with MAC 3 regular layrngoscopy attempt 3.

## 2016-04-22 NOTE — Progress Notes (Signed)
Pt transported to OR

## 2016-04-22 NOTE — Transfer of Care (Signed)
Immediate Anesthesia Transfer of Care Note  Patient: Martha Potts  Procedure(s) Performed: Procedure(s): LAPAROSCOPIC CHOLECYSTECTOMY (N/A)  Patient Location: PACU  Anesthesia Type:General  Level of Consciousness: sedated  Airway & Oxygen Therapy: Patient Spontanous Breathing and Patient connected to face mask oxygen  Post-op Assessment: Report given to RN  Post vital signs: Reviewed and stable  Last Vitals:  Vitals:   04/22/16 0526 04/22/16 0944  BP: (!) 142/84   Pulse: 74   Resp: 20   Temp: 36.8 C (P) 36.2 C    Last Pain:  Vitals:   04/22/16 0944  TempSrc:   PainSc: (P) Asleep      Patients Stated Pain Goal: 0 [phone]  Complications: No apparent anesthesia complications

## 2016-04-22 NOTE — Anesthesia Preprocedure Evaluation (Signed)
Anesthesia Evaluation  Patient identified by MRN, date of birth, ID band Patient awake    Reviewed: Allergy & Precautions, NPO status , Patient's Chart, lab work & pertinent test results  Airway Mallampati: II       Dental  (+) Teeth Intact   Pulmonary neg pulmonary ROS,    breath sounds clear to auscultation       Cardiovascular Exercise Tolerance: Good negative cardio ROS   Rhythm:Regular Rate:Normal     Neuro/Psych negative neurological ROS  negative psych ROS   GI/Hepatic negative GI ROS, Neg liver ROS,   Endo/Other  Morbid obesity  Renal/GU negative Renal ROS     Musculoskeletal   Abdominal (+) + obese,   Peds negative pediatric ROS (+)  Hematology negative hematology ROS (+)   Anesthesia Other Findings   Reproductive/Obstetrics                             Anesthesia Physical Anesthesia Plan  ASA: II  Anesthesia Plan: General   Post-op Pain Management:    Induction: Intravenous  Airway Management Planned: Oral ETT  Additional Equipment:   Intra-op Plan:   Post-operative Plan: Extubation in OR  Informed Consent: I have reviewed the patients History and Physical, chart, labs and discussed the procedure including the risks, benefits and alternatives for the proposed anesthesia with the patient or authorized representative who has indicated his/her understanding and acceptance.     Plan Discussed with: CRNA  Anesthesia Plan Comments:         Anesthesia Quick Evaluation

## 2016-04-22 NOTE — Discharge Summary (Signed)
Patient ID: Martha Potts MRN: [id number] DOB/AGE: 05-17-1990 25 y.o.  Admit date: 04/21/2016 Discharge date: 04/22/2016   Discharge Diagnoses:  Active Problems:   Acute cholecystitis due to biliary calculus   Procedures:  Laparoscopic cholecystectomy  Hospital Course:  Patient was admitted on 12/23 with acute cholecystitis.  Was taken to the operating room on 12/24 and underwent a laparoscopic cholecystectomy.  Patient tolerated the procedure well without complications.  Post-operatively, she was started on a diet and was transitioned to oral pain medications.  Prior to discharge, she was tolerating a regular diet, had her pain well controlled, was ambulating and voiding without issues, and was deemed ready for discharge to home.  Consults: None  Disposition:  Home, self-care  Discharge Instructions    Call MD for:  difficulty breathing, headache or visual disturbances    Complete by:  As directed    Call MD for:  persistant nausea and vomiting    Complete by:  As directed    Call MD for:  redness, tenderness, or signs of infection (pain, swelling, redness, odor or green/yellow discharge around incision site)    Complete by:  As directed    Call MD for:  severe uncontrolled pain    Complete by:  As directed    Call MD for:  temperature >100.4    Complete by:  As directed    Diet - low sodium heart healthy    Complete by:  As directed    Driving Restrictions    Complete by:  As directed    Do not drive while taking narcotics for pain control   Increase activity slowly    Complete by:  As directed    Lifting restrictions    Complete by:  As directed    No heavy lifting of more than 10-15 lbs for 4 weeks.   Remove dressing in 24 hours    Complete by:  As directed    May remove gauze dressing in 24 hrs, but leave Steri Strips in place and allow them to fall off on their own.  May shower then gauze dressing is removed.     Allergies as of 04/22/2016   No Known  Allergies     Medication List    TAKE these medications   oxyCODONE 5 MG immediate release tablet Commonly known as:  Oxy IR/ROXICODONE Take 1-2 tablets (5-10 mg total) by mouth every 4 (four) hours as needed for moderate pain.      Follow-up Information    [facility] [place]. Schedule an appointment as soon as possible for a visit in 2 week(s).   Specialty:  General Surgery Why:  post-op Contact information: [address], [place] [phone]

## 2016-04-22 NOTE — Brief Op Note (Signed)
04/21/2016 - 04/22/2016  9:40 AM  PATIENT:  Martha Potts  25 y.o. female  PRE-OPERATIVE DIAGNOSIS:  cholelithiasis,cholecystitis  POST-OPERATIVE DIAGNOSIS:  same  PROCEDURE:  Procedure(s): LAPAROSCOPIC CHOLECYSTECTOMY (N/A)  SURGEON:  Surgeon(s) and Role:    * Clayburn Pert, MD - Primary  PHYSICIAN ASSISTANT:   ASSISTANTS: none   ANESTHESIA:   general  EBL:  Total I/O In: 800 [I.V.:800] Out: 10 [Blood:10]  BLOOD ADMINISTERED:none  DRAINS: none   LOCAL MEDICATIONS USED:  MARCAINE   , XYLOCAINE  and Amount: 26 ml  SPECIMEN:  Source of Specimen:  gallbladder  DISPOSITION OF SPECIMEN:  PATHOLOGY  COUNTS:  YES  TOURNIQUET:  * No tourniquets in log *  DICTATION: .Dragon Dictation  PLAN OF CARE: return to inpatient status  PATIENT DISPOSITION:  PACU - hemodynamically stable.   Delay start of Pharmacological VTE agent (>24hrs) due to surgical blood loss or risk of bleeding: no

## 2016-04-22 NOTE — Op Note (Signed)
Laparoscopic Cholecystectomy  Pre-operative Diagnosis: Acute cholecystitis  Post-operative Diagnosis: Same  Procedure: Laparoscopic cholecystectomy  Surgeon: Juanda Crumble T. Adonis Huguenin, MD FACS  Anesthesia: Gen. with endotracheal tube  Assistant: None  Procedure Details  The patient was seen again in the Holding Room. The benefits, complications, treatment options, and expected outcomes were discussed with the patient. The risks of bleeding, infection, recurrence of symptoms, failure to resolve symptoms, bile duct damage, bile duct leak, retained common bile duct stone, bowel injury, any of which could require further surgery and/or ERCP, stent, or papillotomy were reviewed with the patient. The likelihood of improving the patient's symptoms with return to their baseline status is good.  The patient and/or family concurred with the proposed plan, giving informed consent.  The patient was taken to Operating Room, identified as [name] and the procedure verified as Laparoscopic Cholecystectomy.  A Time Out was held and the above information confirmed.  Prior to the induction of general anesthesia, antibiotic prophylaxis was administered. VTE prophylaxis was in place. General endotracheal anesthesia was then administered and tolerated well. After the induction, the abdomen was prepped with Chloraprep and draped in the sterile fashion. The patient was positioned in the supine position.  Local anesthetic  was injected into the skin cephalad to the umbilicus and an incision made. The Veress needle was placed. Pneumoperitoneum was then created with CO2 and tolerated well without any adverse changes in the patient's vital signs. A 71mm port was placed in the periumbilical position and the abdominal cavity was explored.  Two 5-mm ports were placed in the right upper quadrant and a 12 mm epigastric port was placed all under direct vision. All skin incisions  were infiltrated with a local anesthetic agent  before making the incision and placing the trocars.   The patient was positioned  in reverse Trendelenburg, tilted slightly to the patient's left.  The gallbladder was identified, the fundus grasped and retracted cephalad. Adhesions were lysed bluntly. The infundibulum was grasped and retracted laterally, exposing the peritoneum overlying the triangle of Calot. This was then divided and exposed in a blunt fashion. A critical view of the cystic duct and cystic artery was obtained.  The cystic duct was clearly identified and bluntly dissected.   Using an Endo Clip applier and the cystic duct and artery were serially clipped. There are cut in between the clips with endoscopic shears. There was no active bleeding or evidence of bile leaking after doing this. The gallbladder was taken from the gallbladder fossa in a retrograde fashion with the electrocautery. The gallbladder was removed and placed in an Endocatch bag. The liver bed was irrigated and inspected. Hemostasis was achieved with the electrocautery. Copious irrigation was utilized and was repeatedly aspirated until clear.  The gallbladder and Endocatch sac were then removed through the epigastric port site.   Due to the number of stones within the gallbladder the epigastric incision site had to be made larger both sharply and bluntly. This allowed the gallbladder to be removed. Inspection of the right upper quadrant was performed. No bleeding, bile duct injury or leak, or bowel injury was noted. Pneumoperitoneum was released.  The epigastric port site was closed with figure-of-eight 0 Vicryl sutures. 4-0 subcuticular Monocryl was used to close the skin. Steristrips and Mastisol and sterile dressings were  applied.  The patient was then extubated and brought to the recovery room in stable condition. Sponge, lap, and needle counts were correct at closure and at the conclusion of the case.  Findings: Acute Cholecystitis   Estimated Blood Loss: 5 mL          Drains: None         Specimens: Gallbladder           Complications: none               Mae Cianci T. Adonis Huguenin, MD, FACS

## 2016-04-25 LAB — SURGICAL PATHOLOGY

## 2016-04-26 ENCOUNTER — Encounter: Payer: Self-pay | Admitting: General Surgery

## 2018-02-05 ENCOUNTER — Ambulatory Visit
Admission: RE | Admit: 2018-02-05 | Discharge: 2018-02-05 | Disposition: A | Discharge status: Home / Self Care | Payer: BC Managed Care – PPO | Source: Ambulatory Visit | Attending: Family Medicine | Admitting: Family Medicine

## 2018-02-05 ENCOUNTER — Other Ambulatory Visit: Payer: Self-pay | Admitting: Family Medicine

## 2018-02-05 DIAGNOSIS — Z3046 Encounter for surveillance of implantable subdermal contraceptive: Secondary | ICD-10-CM

## 2018-02-05 DIAGNOSIS — Z3049 Encounter for surveillance of other contraceptives: Secondary | ICD-10-CM | POA: Diagnosis present

## 2018-04-30 ENCOUNTER — Other Ambulatory Visit: Payer: Self-pay

## 2018-04-30 ENCOUNTER — Emergency Department: Payer: BC Managed Care – PPO

## 2018-04-30 ENCOUNTER — Emergency Department
Admission: EM | Admit: 2018-04-30 | Discharge: 2018-04-30 | Disposition: A | Discharge status: Home / Self Care | Payer: BC Managed Care – PPO | Attending: Emergency Medicine | Admitting: Emergency Medicine

## 2018-04-30 ENCOUNTER — Encounter: Payer: Self-pay | Admitting: Emergency Medicine

## 2018-04-30 DIAGNOSIS — Y9389 Activity, other specified: Secondary | ICD-10-CM | POA: Insufficient documentation

## 2018-04-30 DIAGNOSIS — S8392XA Sprain of unspecified site of left knee, initial encounter: Secondary | ICD-10-CM

## 2018-04-30 DIAGNOSIS — Y998 Other external cause status: Secondary | ICD-10-CM | POA: Diagnosis not present

## 2018-04-30 DIAGNOSIS — Y92009 Unspecified place in unspecified non-institutional (private) residence as the place of occurrence of the external cause: Secondary | ICD-10-CM | POA: Diagnosis not present

## 2018-04-30 DIAGNOSIS — W109XXA Fall (on) (from) unspecified stairs and steps, initial encounter: Secondary | ICD-10-CM | POA: Diagnosis not present

## 2018-04-30 DIAGNOSIS — S8992XA Unspecified injury of left lower leg, initial encounter: Secondary | ICD-10-CM | POA: Diagnosis present

## 2018-04-30 MED ORDER — IBUPROFEN 800 MG PO TABS
800.0000 mg | ORAL_TABLET | Freq: Once | ORAL | Status: AC
  Administered 2018-04-30: 800 mg via ORAL
  Filled 2018-04-30: qty 1

## 2018-04-30 MED ORDER — IBUPROFEN 800 MG PO TABS: 800.0000 mg | ORAL_TABLET | Freq: Three times a day (TID) | ORAL | 0 refills | Status: DC | PRN

## 2018-04-30 MED ORDER — CYCLOBENZAPRINE HCL 5 MG PO TABS: 5.0000 mg | ORAL_TABLET | Freq: Three times a day (TID) | ORAL | 0 refills | Status: DC | PRN

## 2018-04-30 MED ORDER — CYCLOBENZAPRINE HCL 10 MG PO TABS
10.0000 mg | ORAL_TABLET | Freq: Once | ORAL | Status: AC
  Administered 2018-04-30: 10 mg via ORAL
  Filled 2018-04-30: qty 1

## 2018-04-30 NOTE — ED Provider Notes (Signed)
[facility] Emergency Department Provider Note ____________________________________________  Time seen: 1257  I have reviewed the triage vital signs and the nursing notes.  HISTORY  Chief Complaint  Fall  HPI Martha Potts is a 28 y.o. female who presents to the ED from home, accompanied by her mother.  Patient describes pain and disability to the left knee.  She reports that she was walking out of her house, the wooden steps that lead to the door, failed and 1 of the wooden steps broke due to weight of her body.  Patient describes falling through the step and landing on her feet, but twisting her knee. She has reported left knee pain stiffness since the injury.  She applied ice and took Tylenol prior to arrival. She denies a history of chronic or ongoing knee pain.   History reviewed. No pertinent past medical history.  Patient Active Problem List   Diagnosis Date Noted  . Acute cholecystitis due to biliary calculus 04/21/2016    Past Surgical History:  Procedure Laterality Date  . CERVIX SURGERY    . CESAREAN SECTION    . CHOLECYSTECTOMY N/A 04/22/2016   Procedure: LAPAROSCOPIC CHOLECYSTECTOMY;  Surgeon: Clayburn Pert, MD;  Location: [facility] ORS;  Service: General;  Laterality: N/A;    Prior to Admission medications   Medication Sig Start Date End Date Taking? Authorizing Provider  cyclobenzaprine (FLEXERIL) 5 MG tablet Take 1 tablet (5 mg total) by mouth 3 (three) times daily as needed for muscle spasms. 04/30/18   Celenia Hruska, Dannielle Karvonen, PA-C  ibuprofen (ADVIL,MOTRIN) 800 MG tablet Take 1 tablet (800 mg total) by mouth every 8 (eight) hours as needed. 04/30/18   Cassie Shedlock, Dannielle Karvonen, PA-C    Allergies Patient has no known allergies.  History reviewed. No pertinent family history.  Social History Social History   Tobacco Use  . Smoking status: Never Smoker  . Smokeless tobacco: Never Used  Substance Use Topics  . Alcohol use: No  . Drug  use: No    Review of Systems  Constitutional: Negative for fever. Eyes: Negative for visual changes. ENT: Negative for sore throat. Cardiovascular: Negative for chest pain. Respiratory: Negative for shortness of breath. Gastrointestinal: Negative for abdominal pain, vomiting and diarrhea. Genitourinary: Negative for dysuria. Musculoskeletal: Negative for back pain. Skin: Negative for rash. Neurological: Negative for headaches, focal weakness or numbness. ____________________________________________  PHYSICAL EXAM:  VITAL SIGNS: ED Triage Vitals  Enc Vitals Group     BP 04/30/18 1222 135/84     Pulse Rate 04/30/18 1222 81     Resp 04/30/18 1222 16     Temp 04/30/18 1222 97.8 F (36.6 C)     Temp Source 04/30/18 1222 Oral     SpO2 04/30/18 1222 97 %     Weight 04/30/18 1219 (!) 311 lb (141.1 kg)     Height 04/30/18 1219 5\' 5"  (1.651 m)     Head Circumference --      Peak Flow --      Pain Score 04/30/18 1219 7     Pain Loc --      Pain Edu? --      Excl. in [place]? --     Constitutional: Alert and oriented. Well appearing and in no distress. Head: Normocephalic and atraumatic. Eyes: Conjunctivae are normal. Normal extraocular movements Cardiovascular: Normal rate, regular rhythm. Normal distal pulses. Respiratory: Normal respiratory effort. No wheezes/rales/rhonchi. Musculoskeletal: Left knee without obvious deformity, dislocation, or effusion.  Patient with limited flexion  and extension range secondary to pain.  She localizes discomfort to the medial and lateral aspect of the patella.  There is no patella laxity or ballottement appreciated.  No significant popliteal space fullness is noted.  She is mildly tender to palpation to the gastroc musculature in the posterior fossa.  Distally no calf or Achilles numbness is elicited.  Patient without any significant valgus or varus joint stress.  The remainder the knee exam is limited by the patient's pain response.  Nontender with  normal range of motion in all extremities.  Neurologic:  Normal speech and language. No gross focal neurologic deficits are appreciated. Skin:  Skin is warm, dry and intact. No rash noted. ____________________________________________   RADIOLOGY  Left Knee IMPRESSION: No acute osseous abnormality. ____________________________________________  PROCEDURES  Procedures Knee immobilizer ____________________________________________  INITIAL IMPRESSION / ASSESSMENT AND PLAN / ED COURSE  Patient with ED evaluation of acute left knee pain and disability.  Patient's x-rays negative and reassuring for any acute fracture.  No significant indication of internal derangement to the left knee.  Symptoms likely represent a knee sprain.  Patient is placed in a knee immobilizer for comfort and is discharged with prescriptions for ibuprofen and Flexeril.  She is referred to Earnestine Leys for further evaluation management of her knee sprain.  She is given a work note to return to work on Monday as planned. ____________________________________________  FINAL CLINICAL IMPRESSION(S) / ED DIAGNOSES  Final diagnoses:  Sprain of left knee, unspecified ligament, initial encounter      Melvenia Needles, PA-C 04/30/18 1854    Lavonia Drafts, MD 05/04/18 [phone]

## 2018-04-30 NOTE — Discharge Instructions (Addendum)
Your exam and x-ray are consistent with a knee sprain. Your x-ray does not reveal any fracture or dislocation. Take the prescription meds as directed. Wear the brace as needed for support. Rest with the leg elevated and apply ice as needed. Follow-up with Dr. Sabra Heck for ongoing symptoms.

## 2018-04-30 NOTE — ED Triage Notes (Signed)
Pt to ed with c/o fall today,  States a step broke that she was walking on and she twisted her left knee but was able to catch herself from falling.  Pt in wheelchair, appears in nad.

## 2018-04-30 NOTE — ED Notes (Signed)
See triage note  Presents s/p fall  States she stepped down on a step  And the step broke  She is having pain to left knee   States she twisted her knee and also having slight lower back pain

## 2018-07-25 ENCOUNTER — Ambulatory Visit (INDEPENDENT_AMBULATORY_CARE_PROVIDER_SITE_OTHER): Payer: BC Managed Care – PPO | Admitting: Gastroenterology

## 2018-07-25 ENCOUNTER — Other Ambulatory Visit: Payer: Self-pay

## 2018-07-25 DIAGNOSIS — K921 Melena: Secondary | ICD-10-CM | POA: Diagnosis not present

## 2018-07-25 DIAGNOSIS — O99210 Obesity complicating pregnancy, unspecified trimester: Secondary | ICD-10-CM | POA: Insufficient documentation

## 2018-07-25 NOTE — Progress Notes (Signed)
Martha Potts [address]  [place]  [place], [place] [address]  Main: [phone]  Fax: [phone]   Gastroenterology Consultation  Referring Provider:     Ranae Plumber, [place] Primary Care Physician:  Ranae Plumber, [place] Reason for Consultation:     Blood in stool        HPI:   Virtual Visit via Telephone Note  I connected with patient on 07/25/18 at  9:00 AM EDT by telephone and verified that I am speaking with the correct person using two identifiers.   I discussed the limitations, risks, security and privacy concerns of performing an evaluation and management service by telephone and the availability of in person appointments. I also discussed with the patient that there may be a patient responsible charge related to this service. The patient expressed understanding and agreed to proceed.  Location of the patient: Work (In private area) Location of provider: Home Participating persons: Patient and provider only   History of Present Illness: Chief Complaint  Patient presents with  . New Patient (Initial Visit)  . Blood In Stools    about 4 times a week not bright red   . Abdominal Pain    epigastric and lower pain sharp comes and goes  . Irregular bowel habits    constipation followed by diarrhea  . Nausea    usually when the pain occurs     Martha Potts is a 28 y.o. y/o female referred for consultation & management  by Dr. Ranae Plumber, [place].  Patient reports 5 to 75-monthhistory of blood in stool.  Describes that this occurs about 4 times a week and she sees minute amount of blood streaks within the stool in the toilet bowl.  None on the toilet paper.  Does not feel any hemorrhoids when she wipes.  Reports intermittent abdominal pain in the bilateral upper quadrant bilateral lower quadrant area but improved after a bowel movement.  This is chronic, and occurs about once a week.  No vomiting, but reports some nausea when this pain occurs.  No weight  loss.  No black stool.  Uses ibuprofen about every 3 days, 400 mg every 3 days.  No other NSAIDs.  No prior EGD or colonoscopy.  No family history of colon cancer.  Does state that she has had 4 loose bowel movements every day since her cholecystectomy 2 to 3 years ago.  Does not use anything for this.  Does not strain with her bowel movements.  PMHx: Muscle spasms  Past Surgical History:  Procedure Laterality Date  . CERVIX SURGERY    . CESAREAN SECTION    . CHOLECYSTECTOMY N/A 04/22/2016   Procedure: LAPAROSCOPIC CHOLECYSTECTOMY;  Surgeon: CClayburn Pert MD;  Location: [facility] ORS;  Service: General;  Laterality: N/A;    Prior to Admission medications   Medication Sig Start Date End Date Taking? Authorizing Provider  ibuprofen (ADVIL,MOTRIN) 800 MG tablet Take 1 tablet (800 mg total) by mouth every 8 (eight) hours as needed. 04/30/18  Yes Menshew, JDannielle Karvonen PA-C  cyclobenzaprine (FLEXERIL) 5 MG tablet Take 1 tablet (5 mg total) by mouth 3 (three) times daily as needed for muscle spasms. Patient not taking: Reported on 07/25/2018 04/30/18   Menshew, JDannielle Karvonen PA-C  etonogestrel (NEXPLANON) 68 MG IMPL implant Inject into the skin.    [provider]    Family Hx: reviewed, no family hx of colon cancer.  Social History   Tobacco Use  . Smoking  status: Never Smoker  . Smokeless tobacco: Never Used  Substance Use Topics  . Alcohol use: No  . Drug use: No    Allergies as of 07/25/2018  . (No Known Allergies)    Review of Systems:    All systems reviewed and negative except where noted in HPI.   Observations/Objective:  Labs: CBC    Component Value Date/Time   WBC 11.1 (H) 04/22/2016 0513   RBC 4.16 04/22/2016 0513   HGB 12.0 04/22/2016 0513   HGB 13.0 04/27/2014 0247   HCT 35.6 04/22/2016 0513   HCT 39.1 04/27/2014 0247   PLT 349 04/22/2016 0513   PLT 353 04/27/2014 0247   MCV 85.4 04/22/2016 0513   MCV 88 04/27/2014 0247   MCH 28.9 04/22/2016 0513    MCHC 33.8 04/22/2016 0513   RDW 13.4 04/22/2016 0513   RDW 13.2 04/27/2014 0247   LYMPHSABS 4.0 (H) 04/27/2014 0247   MONOABS 1.1 (H) 04/27/2014 0247   EOSABS 0.0 04/27/2014 0247   BASOSABS 0.0 04/27/2014 0247   CMP     Component Value Date/Time   NA 137 04/22/2016 0513   NA 147 (H) 04/27/2014 0247   K 3.7 04/22/2016 0513   K 3.9 04/27/2014 0247   CL 108 04/22/2016 0513   CL 107 04/27/2014 0247   CO2 23 04/22/2016 0513   CO2 24 04/27/2014 0247   GLUCOSE 86 04/22/2016 0513   GLUCOSE 74 04/27/2014 0247   BUN 10 04/22/2016 0513   BUN 9 04/27/2014 0247   CREATININE 0.59 04/22/2016 0513   CREATININE 0.72 04/27/2014 0247   CALCIUM 8.3 (L) 04/22/2016 0513   CALCIUM 9.0 04/27/2014 0247   PROT 7.6 04/21/2016 1843   PROT 7.8 04/27/2014 0247   ALBUMIN 4.0 04/21/2016 1843   ALBUMIN 3.5 04/27/2014 0247   AST 36 04/21/2016 1843   AST 19 04/27/2014 0247   ALT 30 04/21/2016 1843   ALT 28 04/27/2014 0247   ALKPHOS 92 04/21/2016 1843   ALKPHOS 109 04/27/2014 0247   BILITOT 0.7 04/21/2016 1843   BILITOT 0.3 04/27/2014 0247   GFRNONAA >60 04/22/2016 0513   GFRNONAA >60 04/27/2014 0247   GFRNONAA >60 04/21/2012 1537   GFRAA >60 04/22/2016 0513   GFRAA >60 04/27/2014 0247   GFRAA >60 04/21/2012 1537    Imaging Studies: No results found.  Assessment and Plan:   Martha Potts is a 28 y.o. 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  However, she has not had any recent lab work, therefore will start with lab work to evaluate CRP, ESR and fecal calprotectin, and if these are elevated, she would need a colonoscopy to evaluate for IBD.  Risk of colon cancer is low given symptoms ongoing at least for 6 months and no weight loss, obstructive symptoms or any other  alarming symptoms present.  However, will evaluate for anemia and if this is present, consider colonoscopy sooner than later.  After lab work is complete can consider also dial for postcholecystectomy diarrhea  Due to her ongoing symptoms, colonoscopy would be reasonable electively to evaluate for hemorrhoids, rule out colon malignancy and evaluate for IBD as well.  If lab work is abnormal, colonoscopy can be considered sooner than later.  Due to the current COVID-19 situation, elective procedures are currently  being scheduled for a later time as per nationwide recommendations. Therefore, the patient was informed of the need to schedule the procedure in the upcoming months, instead of sooner, since this is an elective procedure. Patient will need to be contacted at a later time to place him/her on the schedule. However, alarm symptoms were discussed in detail, and if these occur pt was advised to call us to discuss change in symptoms and evaluation for a more urgent procedure if appropriate. No indication for urgent procedure exist at this time. Patient was given the contact information to reach [place] with any questions, concerns, or change in symptoms.   Her abdominal pain seem to be related to her bowel movements and improve after her bowel movements it may be related to abdominal cramping related to her frequent bowel movements.  Will await lab work and consider ursodiol after it if everything is normal.   Follow Up Instructions: Follow-up clinic or tele-visit in 2 to 3 months to reassess symptoms but discuss timing for colonoscopy once schedules are back to normal for scheduling elective procedures.  I discussed the assessment and treatment plan with the patient. The patient was provided an opportunity to ask questions and all were answered. The patient agreed with the plan and demonstrated an understanding of the instructions.   The patient was advised to call back or seek an in-person evaluation  if the symptoms worsen or if the condition fails to improve as anticipated.  I provided 14  minutes of non-face-to-face time during this encounter.   [name], MD  Speech recognition software was used to dictate the above note.

## 2018-07-29 ENCOUNTER — Telehealth: Payer: Self-pay | Admitting: Gastroenterology

## 2018-07-29 ENCOUNTER — Other Ambulatory Visit: Payer: Self-pay

## 2018-07-29 DIAGNOSIS — K921 Melena: Secondary | ICD-10-CM

## 2018-07-29 NOTE — Telephone Encounter (Signed)
Pt left vm she had telemed visit  on Friday and was told to have labs done and get a stool kit pt went today and there was no order in for it please call pt  °

## 2018-07-29 NOTE — Telephone Encounter (Signed)
I contacted pt and informed her that her labs have been released to [facility] and she was very understanding and will try to have them done tomorrow.

## 2018-07-30 DIAGNOSIS — F4310 Post-traumatic stress disorder, unspecified: Secondary | ICD-10-CM | POA: Insufficient documentation

## 2018-07-30 DIAGNOSIS — F5102 Adjustment insomnia: Secondary | ICD-10-CM | POA: Insufficient documentation

## 2018-07-31 LAB — COMPREHENSIVE METABOLIC PANEL
ALK PHOS: 107 IU/L (ref 39–117)
ALT: 21 IU/L (ref 0–32)
AST: 18 IU/L (ref 0–40)
Albumin/Globulin Ratio: 1.6 (ref 1.2–2.2)
Albumin: 4.1 g/dL (ref 3.9–5.0)
BUN/Creatinine Ratio: 15 (ref 9–23)
BUN: 10 mg/dL (ref 6–20)
Bilirubin Total: 0.2 mg/dL (ref 0.0–1.2)
CO2: 20 mmol/L (ref 20–29)
CREATININE: 0.65 mg/dL (ref 0.57–1.00)
Calcium: 9.1 mg/dL (ref 8.7–10.2)
Chloride: 104 mmol/L (ref 96–106)
GFR calc Af Amer: 142 mL/min/{1.73_m2} (ref >59)
GFR calc non Af Amer: 123 mL/min/{1.73_m2} (ref >59)
Globulin, Total: 2.6 g/dL (ref 1.5–4.5)
Glucose: 93 mg/dL (ref 65–99)
Potassium: 4.6 mmol/L (ref 3.5–5.2)
SODIUM: 141 mmol/L (ref 134–144)
Total Protein: 6.7 g/dL (ref 6.0–8.5)

## 2018-07-31 LAB — FERRITIN: FERRITIN: 178 ng/mL — AB (ref 15–150)

## 2018-07-31 LAB — CBC
Hematocrit: 35.4 % (ref 34.0–46.6)
Hemoglobin: 12.4 g/dL (ref 11.1–15.9)
MCH: 29.7 pg (ref 26.6–33.0)
MCHC: 35 g/dL (ref 31.5–35.7)
MCV: 85 fL (ref 79–97)
Platelets: 397 10*3/uL (ref 150–450)
RBC: 4.17 x10E6/uL (ref 3.77–5.28)
RDW: 12.9 % (ref 11.7–15.4)
WBC: 12.4 10*3/uL — ABNORMAL HIGH (ref 3.4–10.8)

## 2018-07-31 LAB — SEDIMENTATION RATE: Sed Rate: 54 mm/hr — ABNORMAL HIGH (ref 0–32)

## 2018-07-31 LAB — C-REACTIVE PROTEIN: CRP: 12 mg/L — ABNORMAL HIGH (ref 0–10)

## 2018-08-01 ENCOUNTER — Other Ambulatory Visit: Payer: Self-pay

## 2018-08-01 ENCOUNTER — Telehealth: Payer: Self-pay

## 2018-08-01 DIAGNOSIS — K921 Melena: Secondary | ICD-10-CM

## 2018-08-01 NOTE — Telephone Encounter (Signed)
-----   Message from [name], MD sent at 08/01/2018 10:29 AM EDT ----- Martha Potts please let patient know, her inflammatory markers are elevated. Please ask her to collect her stool and submit for testing soon. Also I have ordered one more stool test to rule out infection, please ask her to get this done asap

## 2018-08-01 NOTE — Telephone Encounter (Signed)
Pt aware of results and of need of another stool sample. Will go by [facility] and obtain specimen containers for new lab test. She has one sample but needs more. Will do asap.

## 2018-08-01 NOTE — Addendum Note (Signed)
Addended by: [name] on: 08/01/2018 10:28 AM   Modules accepted: Orders

## 2018-08-04 LAB — CALPROTECTIN, FECAL: Calprotectin, Fecal: <16 ug/g (ref 0–120)

## 2018-08-04 LAB — H. PYLORI ANTIGEN, STOOL: H pylori Ag, Stl: NEGATIVE

## 2018-08-05 ENCOUNTER — Telehealth: Payer: Self-pay

## 2018-08-05 LAB — GI PROFILE, STOOL, PCR

## 2018-08-05 NOTE — Telephone Encounter (Signed)
Pt notified of results and did submit her last stool sample on Friday afternoon. Pt did state that her pain was becoming more frequent, almost constant.

## 2018-08-05 NOTE — Telephone Encounter (Signed)
-----   Message from [name], MD sent at 08/05/2018  9:47 AM EDT ----- Jackelyn Poling please let patient know, her blood work showed elevated inflammatory markers but her stool test did not. She is not anemic. We will await the last stool test we ordered - the GI panel. Please ask her to submit this soon.

## 2018-08-06 ENCOUNTER — Ambulatory Visit (INDEPENDENT_AMBULATORY_CARE_PROVIDER_SITE_OTHER): Payer: BC Managed Care – PPO | Admitting: Gastroenterology

## 2018-08-06 ENCOUNTER — Encounter: Payer: Self-pay | Admitting: Gastroenterology

## 2018-08-06 DIAGNOSIS — K921 Melena: Secondary | ICD-10-CM

## 2018-08-06 MED ORDER — NA SULFATE-K SULFATE-MG SULF 17.5-3.13-1.6 GM/177ML PO SOLN: 1.0000 | Freq: Once | ORAL | 0 refills | Status: AC

## 2018-08-06 NOTE — Progress Notes (Signed)
Martha Antigua, MD [address]  [place]  [place], [place] [address]  Main: [phone]  Fax: [phone]   Primary Care Physician: Martha Potts, [place]  Virtual Visit via Video Note  I connected with patient on 08/06/18 at 11:00 AM EDT by video (using [email or web address]) and verified that I am speaking with the correct person using two identifiers.   I discussed the limitations, risks, security and privacy concerns of performing an evaluation and management service by video and the availability of in person appointments. I also discussed with the patient that there may be a patient responsible charge related to this service. The patient expressed understanding and agreed to proceed.  Location of Patient: Home Location of Provider: Home Persons involved: Patient and provider only (Nursing staff checked in patient via phone but were not physically involved in the video interaction - see their notes)   History of Present Illness: Chief Complaint  Patient presents with  . Follow-up    abdominal pain    HPI: Martha Potts is a 28 y.o. female with 53-monthhistory of blood in stool.  Patient reports both upper and lower quadrant abdominal pain, but no fever or chills, no nausea vomiting, no weight loss.blood in stool occurs about 3-4 times a week.  3-4 loose bowel movements daily.    Patient had recent work-up that showed elevated CRP and ESR, but normal fecal calprotectin.  Infectious GI panel was negative.  CBC did not show anemia.    Current Outpatient Medications  Medication Sig Dispense Refill  . etonogestrel (NEXPLANON) 68 MG IMPL implant Inject into the skin.    .Marland Kitchenibuprofen (ADVIL,MOTRIN) 800 MG tablet Take 1 tablet (800 mg total) by mouth every 8 (eight) hours as needed. 30 tablet 0  . venlafaxine XR (EFFEXOR-XR) 37.5 MG 24 hr capsule Take one (1) capsule by mouth daily for 5 days, then take two (2) capsules daily    . venlafaxine XR (EFFEXOR-XR) 37.5 MG 24 hr capsule  TAKE ONE CAPSULE BY MOUTH DAILY FOR FIVE DAYS. THEN INCREASE TO TAKE TWO CAPSULES BY MOUTH DAILY THEREAFTER    . cyclobenzaprine (FLEXERIL) 5 MG tablet Take 1 tablet (5 mg total) by mouth 3 (three) times daily as needed for muscle spasms. (Patient not taking: Reported on 07/25/2018) 15 tablet 0   No current facility-administered medications for this visit.     Allergies as of 08/06/2018  . (No Known Allergies)    Review of Systems:    All systems reviewed and negative except where noted in HPI.   Observations/Objective:  Labs: CMP     Component Value Date/Time   NA 141 07/30/2018 1358   NA 147 (H) 04/27/2014 0247   K 4.6 07/30/2018 1358   K 3.9 04/27/2014 0247   CL 104 07/30/2018 1358   CL 107 04/27/2014 0247   CO2 20 07/30/2018 1358   CO2 24 04/27/2014 0247   GLUCOSE 93 07/30/2018 1358   GLUCOSE 86 04/22/2016 0513   GLUCOSE 74 04/27/2014 0247   BUN 10 07/30/2018 1358   BUN 9 04/27/2014 0247   CREATININE 0.65 07/30/2018 1358   CREATININE 0.72 04/27/2014 0247   CALCIUM 9.1 07/30/2018 1358   CALCIUM 9.0 04/27/2014 0247   PROT 6.7 07/30/2018 1358   PROT 7.8 04/27/2014 0247   ALBUMIN 4.1 07/30/2018 1358   ALBUMIN 3.5 04/27/2014 0247   AST 18 07/30/2018 1358   AST 19 04/27/2014 0247   ALT 21 07/30/2018 1358   ALT 28  04/27/2014 0247   ALKPHOS 107 07/30/2018 1358   ALKPHOS 109 04/27/2014 0247   BILITOT 0.2 07/30/2018 1358   BILITOT 0.3 04/27/2014 0247   GFRNONAA 123 07/30/2018 1358   GFRNONAA >60 04/27/2014 0247   GFRNONAA >60 04/21/2012 1537   GFRAA 142 07/30/2018 1358   GFRAA >60 04/27/2014 0247   GFRAA >60 04/21/2012 1537   Lab Results  Component Value Date   WBC 12.4 (H) 07/30/2018   HGB 12.4 07/30/2018   HCT 35.4 07/30/2018   MCV 85 07/30/2018   PLT 397 07/30/2018    Imaging Studies: No results found.  Assessment and Plan:   Martha Potts is a 28 y.o. y/o female blood in stool for 6 months  Assessment and Plan: Patient is work-up so far is  concerning for underlying IBD  We extensively discussed further work-up options, including colonoscopy to evaluate for IBD.  We discussed the benefits and risks of undergoing this procedure in the setting of the coronavirus pandemic, and patient would like to proceed with scheduling the colonoscopy and understands the risks of exposure to a hospital setting during the current pandemic.  GI Society guidelines and recommendations state that if a procedure will change management and cannot wait, the recommendations are to go ahead and proceed.  Given that her symptoms have been ongoing for 6 months, and delaying her procedure any further can risk her worsening of possible underlying IBD, and going through the procedure can help start treatment to improve her symptoms and any bowel inflammation, it is reasonable to schedule the procedure at this time.  I have discussed alternative options, risks & benefits,  which include, but are not limited to, bleeding, infection, perforation,respiratory complication & drug reaction.  The patient agrees with this plan & written consent will be obtained.     Follow Up Instructions: Colonoscopy in the next few weeks   I discussed the assessment and treatment plan with the patient. The patient was provided an opportunity to ask questions and all were answered. The patient agreed with the plan and demonstrated an understanding of the instructions.   The patient was advised to call back or seek an in-person evaluation if the symptoms worsen or if the condition fails to improve as anticipated.  I provided 20 minutes of non-face-to-face time during this encounter.   Martha Manifold, MD  Speech recognition software was used to dictate this note.

## 2018-08-07 ENCOUNTER — Telehealth: Payer: Self-pay

## 2018-08-07 ENCOUNTER — Telehealth: Payer: Self-pay | Admitting: Gastroenterology

## 2018-08-07 NOTE — Telephone Encounter (Signed)
I spoke with pt earlier today and scheduled Colonoscopy originally for 08/21/18 but Dr. Bonna Gains wanted it scheduled earlier if possible. The Colonoscopy is now scheduled for 08/12/18. Dr. Bonna Gains spoke with [place] in Endo at [facility]. Pt is aware.

## 2018-08-07 NOTE — Telephone Encounter (Signed)
Patient called & would like to know which pharmacy her pre was called into. She checked with [facility] on [address] but they did not have it there. Procedure is scheduled for next  Tuesday.

## 2018-08-07 NOTE — Telephone Encounter (Signed)
Pt informed that Rx for suprep was sent to [facility] on [address]. She could not remember which one she had on file. This is fine with pt.

## 2018-08-11 ENCOUNTER — Telehealth: Payer: Self-pay | Admitting: Gastroenterology

## 2018-08-11 NOTE — Telephone Encounter (Signed)
Pt left vm she is having a colonoscopy tomorrow and has a few questions please call pt

## 2018-08-11 NOTE — Telephone Encounter (Signed)
Patient contacted the office in regards to having her colonoscopy with her menstrual cycle starting.  Pt has been advised that she may still have her colonoscopy as long as she is wearing a tampon.  Thanks [employer]

## 2018-08-11 NOTE — Telephone Encounter (Signed)
Patient called & has questions about her colonoscopy scheduled for 08-12-18 with Dr Bonna Gains.

## 2018-08-11 NOTE — Telephone Encounter (Signed)
Patients call has been returned.  She has been advised that she may still have her colonoscopy as long as she wears a tampon.  Thanks [employer]

## 2018-08-12 ENCOUNTER — Encounter: Payer: Self-pay | Admitting: *Deleted

## 2018-08-12 ENCOUNTER — Encounter
Admission: RE | Disposition: A | Discharge status: Home / Self Care | Payer: Self-pay | Source: Home / Self Care | Attending: Gastroenterology

## 2018-08-12 ENCOUNTER — Ambulatory Visit
Admission: RE | Admit: 2018-08-12 | Discharge: 2018-08-12 | Disposition: A | Discharge status: Home / Self Care | Payer: BC Managed Care – PPO | Attending: Gastroenterology | Admitting: Gastroenterology

## 2018-08-12 ENCOUNTER — Ambulatory Visit: Payer: BC Managed Care – PPO | Admitting: Anesthesiology

## 2018-08-12 DIAGNOSIS — Z978 Presence of other specified devices: Secondary | ICD-10-CM | POA: Diagnosis not present

## 2018-08-12 DIAGNOSIS — K625 Hemorrhage of anus and rectum: Secondary | ICD-10-CM | POA: Insufficient documentation

## 2018-08-12 DIAGNOSIS — D125 Benign neoplasm of sigmoid colon: Secondary | ICD-10-CM | POA: Diagnosis not present

## 2018-08-12 DIAGNOSIS — R197 Diarrhea, unspecified: Secondary | ICD-10-CM

## 2018-08-12 DIAGNOSIS — K635 Polyp of colon: Secondary | ICD-10-CM

## 2018-08-12 DIAGNOSIS — K921 Melena: Secondary | ICD-10-CM

## 2018-08-12 DIAGNOSIS — Z6841 Body Mass Index (BMI) 40.0 and over, adult: Secondary | ICD-10-CM | POA: Diagnosis not present

## 2018-08-12 HISTORY — PX: COLONOSCOPY WITH PROPOFOL: SHX5780

## 2018-08-12 LAB — POCT PREGNANCY, URINE: Preg Test, Ur: NEGATIVE

## 2018-08-12 SURGERY — COLONOSCOPY WITH PROPOFOL
Anesthesia: General

## 2018-08-12 MED ORDER — LIDOCAINE HCL (CARDIAC) PF 100 MG/5ML IV SOSY
PREFILLED_SYRINGE | INTRAVENOUS | Status: DC | PRN
  Administered 2018-08-12: 40 mg via INTRAVENOUS

## 2018-08-12 MED ORDER — LIDOCAINE HCL (PF) 2 % IJ SOLN
INTRAMUSCULAR | Status: AC
  Filled 2018-08-12: qty 10

## 2018-08-12 MED ORDER — FENTANYL CITRATE (PF) 100 MCG/2ML IJ SOLN
INTRAMUSCULAR | Status: DC | PRN
  Administered 2018-08-12: 25 ug via INTRAVENOUS
  Administered 2018-08-12: 50 ug via INTRAVENOUS
  Administered 2018-08-12: 25 ug via INTRAVENOUS

## 2018-08-12 MED ORDER — MIDAZOLAM HCL 2 MG/2ML IJ SOLN
INTRAMUSCULAR | Status: AC
  Filled 2018-08-12: qty 2

## 2018-08-12 MED ORDER — MIDAZOLAM HCL 2 MG/2ML IJ SOLN
INTRAMUSCULAR | Status: DC | PRN
  Administered 2018-08-12: 2 mg via INTRAVENOUS

## 2018-08-12 MED ORDER — PROPOFOL 10 MG/ML IV BOLUS
INTRAVENOUS | Status: AC
  Filled 2018-08-12: qty 20

## 2018-08-12 MED ORDER — PROPOFOL 500 MG/50ML IV EMUL
INTRAVENOUS | Status: AC
  Filled 2018-08-12: qty 50

## 2018-08-12 MED ORDER — SODIUM CHLORIDE 0.9 % IV SOLN
INTRAVENOUS | Status: DC
  Administered 2018-08-12: 10:00:00 via INTRAVENOUS

## 2018-08-12 MED ORDER — PROPOFOL 500 MG/50ML IV EMUL
INTRAVENOUS | Status: DC | PRN
  Administered 2018-08-12: 150 ug/kg/min via INTRAVENOUS

## 2018-08-12 MED ORDER — FENTANYL CITRATE (PF) 100 MCG/2ML IJ SOLN
INTRAMUSCULAR | Status: AC
  Filled 2018-08-12: qty 2

## 2018-08-12 MED ORDER — PROPOFOL 10 MG/ML IV BOLUS
INTRAVENOUS | Status: DC | PRN
  Administered 2018-08-12: 20 mg via INTRAVENOUS
  Administered 2018-08-12: 30 mg via INTRAVENOUS
  Administered 2018-08-12: 50 mg via INTRAVENOUS
  Administered 2018-08-12: 10 mg via INTRAVENOUS
  Administered 2018-08-12: 90 mg via INTRAVENOUS

## 2018-08-12 NOTE — Transfer of Care (Signed)
Immediate Anesthesia Transfer of Care Note  Patient: Martha Potts  Procedure(s) Performed: Procedure(s) with comments: COLONOSCOPY WITH PROPOFOL (N/A) - Urgent per Dr. Bonna Gains  Patient Location: PACU and Endoscopy Unit  Anesthesia Type:General  Level of Consciousness: sedated  Airway & Oxygen Therapy: Patient Spontanous Breathing and Patient connected to nasal cannula oxygen  Post-op Assessment: Report given to RN and Post -op Vital signs reviewed and stable  Post vital signs: Reviewed and stable  Last Vitals:  Vitals:   08/12/18 0939 08/12/18 1213  BP: 132/78 131/78  Pulse:  75  Resp:  17  Temp: 36.5 C (!) 36.4 C  SpO2: 19% 509%    Complications: No apparent anesthesia complications

## 2018-08-12 NOTE — Discharge Instructions (Signed)
Colon Polyps    Polyps are tissue growths inside the body. Polyps can grow in many places, including the large intestine (colon). A polyp may be a round bump or a mushroom-shaped growth. You could have one polyp or several.  Most colon polyps are noncancerous (benign). However, some colon polyps can become cancerous over time. Finding and removing the polyps early can help prevent this.  What are the causes?  The exact cause of colon polyps is not known.  What increases the risk?  You are more likely to develop this condition if you:   Have a family history of colon cancer or colon polyps.   Are older than 50 or older than 45 if you are African American.   Have inflammatory bowel disease, such as ulcerative colitis or Crohn's disease.   Have certain hereditary conditions, such as:  ? Familial adenomatous polyposis.  ? Lynch syndrome.  ? Turcot syndrome.  ? Peutz-Jeghers syndrome.   Are overweight.   Smoke cigarettes.   Do not get enough exercise.   Drink too much alcohol.   Eat a diet that is high in fat and red meat and low in fiber.   Had childhood cancer that was treated with abdominal radiation.  What are the signs or symptoms?  Most polyps do not cause symptoms.  If you have symptoms, they may include:   Blood coming from your rectum when having a bowel movement.   Blood in your stool. The stool may look dark red or black.   Abdominal pain.   A change in bowel habits, such as constipation or diarrhea.  How is this diagnosed?  This condition is diagnosed with a colonoscopy. This is a procedure in which a lighted, flexible scope is inserted into the anus and then passed into the colon to examine the area. Polyps are sometimes found when a colonoscopy is done as part of routine cancer screening tests.  How is this treated?  Treatment for this condition involves removing any polyps that are found. Most polyps can be removed during a colonoscopy. Those polyps will then be tested for cancer. Additional  treatment may be needed depending on the results of testing.  Follow these instructions at home:  Lifestyle   Maintain a healthy weight, or lose weight if recommended by your health care provider.   Exercise every day or as told by your health care provider.   Do not use any products that contain nicotine or tobacco, such as cigarettes and e-cigarettes. If you need help quitting, ask your health care provider.   If you drink alcohol, limit how much you have:  ? 0-1 drink a day for women.  ? 0-2 drinks a day for men.   Be aware of how much alcohol is in your drink. In the [place] one drink equals one 12 oz bottle of beer (355 mL), one 5 oz glass of wine (148 mL), or one 1 oz shot of hard liquor (44 mL).  Eating and drinking     Eat foods that are high in fiber, such as fruits, vegetables, and whole grains.   Eat foods that are high in calcium and vitamin D, such as milk, cheese, yogurt, eggs, liver, fish, and broccoli.   Limit foods that are high in fat, such as fried foods and desserts.   Limit the amount of red meat and processed meat you eat, such as hot dogs, sausage, bacon, and lunch meats.  General instructions   Keep all follow-up visits   as told by your health care provider. This is important.  ? This includes having regularly scheduled colonoscopies.  ? Talk to your health care provider about when you need a colonoscopy.  Contact a health care provider if:   You have new or worsening bleeding during a bowel movement.   You have new or increased blood in your stool.   You have a change in bowel habits.   You lose weight for no known reason.  Summary   Polyps are tissue growths inside the body. Polyps can grow in many places, including the colon.   Most colon polyps are noncancerous (benign), but some can become cancerous over time.   This condition is diagnosed with a colonoscopy.   Treatment for this condition involves removing any polyps that are found. Most polyps can be removed during a  colonoscopy.  This information is not intended to replace advice given to you by your health care provider. Make sure you discuss any questions you have with your health care provider.  Document Released: 01/11/2004 Document Revised: 08/01/2017 Document Reviewed: 08/01/2017  Elsevier Interactive Patient Education  2019 [employer]

## 2018-08-12 NOTE — Anesthesia Postprocedure Evaluation (Signed)
Anesthesia Post Note  Patient: [name]  Procedure(s) Performed: COLONOSCOPY WITH PROPOFOL (N/A )  Patient location during evaluation: Endoscopy Anesthesia Type: General Level of consciousness: awake and alert and oriented Pain management: pain level controlled Vital Signs Assessment: post-procedure vital signs reviewed and stable Respiratory status: spontaneous breathing, nonlabored ventilation and respiratory function stable Cardiovascular status: blood pressure returned to baseline and stable Postop Assessment: no signs of nausea or vomiting Anesthetic complications: no     Last Vitals:  Vitals:   08/12/18 1223 08/12/18 1233  BP: (!) 143/88 (!) 136/94  Pulse: 67 66  Resp: 16 13  Temp: (!) 36.3 C   SpO2: 100% 100%    Last Pain:  Vitals:   08/12/18 1233  TempSrc:   PainSc: 0-No pain                 Ermalinda Joubert

## 2018-08-12 NOTE — Op Note (Signed)
[facility] Gastroenterology Patient Name: Martha Potts Procedure Date: 08/12/2018 11:15 AM MRN: [id number] Account #: [id number] Date of Birth: 03/31/1991 Admit Type: Outpatient Age: 28 Room: [facility] ENDO ROOM 4 Gender: Female Note Status: Finalized Procedure:            Colonoscopy Indications:          Hematochezia, Diarrhea (presumed secondary to                        inflammatory bowel disease) Providers:            Varnita B. Bonna Gains MD, MD Medicines:            Monitored Anesthesia Care Complications:        No immediate complications. Procedure:            Pre-Anesthesia Assessment:                       - ASA Grade Assessment: II - A patient with mild                        systemic disease.                       - Prior to the procedure, a History and Physical was                        performed, and patient medications, allergies and                        sensitivities were reviewed. The patient's tolerance of                        previous anesthesia was reviewed.                       - The risks and benefits of the procedure and the                        sedation options and risks were discussed with the                        patient. All questions were answered and informed                        consent was obtained.                       - Patient identification and proposed procedure were                        verified prior to the procedure by the physician, the                        nurse, the anesthesiologist, the anesthetist and the                        technician. The procedure was verified in the procedure                        room.  After obtaining informed consent, the colonoscope was                        passed under direct vision. Throughout the procedure,                        the patient's blood pressure, pulse, and oxygen                        saturations were monitored continuously. The                   Colonoscope was introduced through the anus and                        advanced to the the terminal ileum. The colonoscopy was                        performed with ease. The patient tolerated the                        procedure well. The quality of the bowel preparation                        was good. Findings:      The perianal and digital rectal examinations were normal.      A 10 mm polyp was found in the sigmoid colon. The polyp was       pedunculated. The polyp was removed with a hot snare. Resection and       retrieval were complete. To prevent bleeding after the polypectomy, two       hemostatic clips were successfully placed. There was no bleeding at the       end of the procedure.      A 5 mm polyp was found in the sigmoid colon. The polyp was sessile. The       polyp was removed with a cold snare. Resection and retrieval were       complete.      The exam was otherwise without abnormality.      The rectum, sigmoid colon, descending colon, transverse colon, ascending       colon, cecum and ileum appeared normal. Biopsies were obtained in the       rectum, in the sigmoid colon, in the descending colon, in the transverse       colon, in the ascending colon, in the cecum and in the terminal ileum       with cold forceps for histology.      The retroflexed view of the distal rectum and anal verge was normal and       showed no anal or rectal abnormalities. Impression:           - One 10 mm polyp in the sigmoid colon, removed with a                        hot snare. Resected and retrieved. Clips were placed.                       - One 5 mm polyp in the sigmoid colon, removed with a  cold snare. Resected and retrieved.                       - The examination was otherwise normal.                       - The rectum, sigmoid colon, descending colon,                        transverse colon, ascending colon, cecum and terminal                         ileum are normal.                       - The distal rectum and anal verge are normal on                        retroflexion view.                       - Biopsies were obtained in the rectum, in the sigmoid                        colon, in the descending colon, in the transverse                        colon, in the ascending colon, in the cecum and in the                        terminal ileum. Recommendation:       - Discharge patient to home (with escort).                       - Advance diet as tolerated.                       - Continue present medications.                       - Await pathology results.                       - Repeat colonoscopy in 3 years.                       - The findings and recommendations were discussed with                        the patient.                       - The findings and recommendations were discussed with                        the patient's family.                       - Return to primary care physician as previously                        scheduled.                       -  High fiber diet. Procedure Code(s):    --- Professional ---                       [phone], Colonoscopy, flexible; with removal of tumor(s),                        polyp(s), or other lesion(s) by snare technique                       45380, 54, Colonoscopy, flexible; with biopsy, single                        or multiple Diagnosis Code(s):    --- Professional ---                       K63.5, Polyp of colon                       K92.1, Melena (includes Hematochezia)                       R19.7, Diarrhea, unspecified CPT copyright 2019 American Medical Association. All rights reserved. The codes documented in this report are preliminary and upon coder review may  be revised to meet current compliance requirements.  [name], MD Margretta Sidle B. Bonna Gains MD, MD 08/12/2018 12:12:25 PM This report has been signed electronically. Number of Addenda: 0 Note Initiated  On: 08/12/2018 11:15 AM Scope Withdrawal Time: 0 hours 21 minutes 10 seconds  Total Procedure Duration: 0 hours 26 minutes 52 seconds  Estimated Blood Loss: Estimated blood loss: none.      [facility]

## 2018-08-12 NOTE — H&P (Signed)
Martha Antigua, MD [address] [place], [place], [place], [address] [address], [place], [place], [place], [address] Phone: [phone]  Fax: [phone]  Primary Care Physician:  Ranae Potts, [place]   Pre-Procedure History & Physical: HPI:  Martha Potts is a 28 y.o. female is here for a colonoscopy.   History reviewed. No pertinent past medical history.  Past Surgical History:  Procedure Laterality Date  . CERVIX SURGERY    . CESAREAN SECTION    . CHOLECYSTECTOMY N/A 04/22/2016   Procedure: LAPAROSCOPIC CHOLECYSTECTOMY;  Surgeon: Clayburn Pert, MD;  Location: [facility] ORS;  Service: General;  Laterality: N/A;    Prior to Admission medications   Medication Sig Start Date End Date Taking? Authorizing Provider  etonogestrel (NEXPLANON) 68 MG IMPL implant Inject into the skin.   Yes [provider]  ibuprofen (ADVIL,MOTRIN) 800 MG tablet Take 1 tablet (800 mg total) by mouth every 8 (eight) hours as needed. 04/30/18  Yes Menshew, Dannielle Karvonen, PA-C  venlafaxine XR (EFFEXOR-XR) 37.5 MG 24 hr capsule Take one (1) capsule by mouth daily for 5 days, then take two (2) capsules daily 07/30/18  Yes [provider]  venlafaxine XR (EFFEXOR-XR) 37.5 MG 24 hr capsule TAKE ONE CAPSULE BY MOUTH DAILY FOR FIVE DAYS. THEN INCREASE TO TAKE TWO CAPSULES BY MOUTH DAILY THEREAFTER 07/30/18  Yes [provider]  cyclobenzaprine (FLEXERIL) 5 MG tablet Take 1 tablet (5 mg total) by mouth 3 (three) times daily as needed for muscle spasms. Patient not taking: Reported on 07/25/2018 04/30/18   Menshew, Dannielle Karvonen, PA-C    Allergies as of 08/06/2018  . (No Known Allergies)    History reviewed. No pertinent family history.  Social History   Socioeconomic History  . Marital status: Married    Spouse name: Not on file  . Number of children: Not on file  . Years of education: Not on file  . Highest education level: Not on file  Occupational History  . Not on  file  Social Needs  . Financial resource strain: Not on file  . Food insecurity:    Worry: Not on file    Inability: Not on file  . Transportation needs:    Medical: Not on file    Non-medical: Not on file  Tobacco Use  . Smoking status: Never Smoker  . Smokeless tobacco: Never Used  Substance and Sexual Activity  . Alcohol use: No  . Drug use: No  . Sexual activity: Not on file  Lifestyle  . Physical activity:    Days per week: Not on file    Minutes per session: Not on file  . Stress: Not on file  Relationships  . Social connections:    Talks on phone: Not on file    Gets together: Not on file    Attends religious service: Not on file    Active member of club or organization: Not on file    Attends meetings of clubs or organizations: Not on file    Relationship status: Not on file  . Intimate partner violence:    Fear of current or ex partner: Not on file    Emotionally abused: Not on file    Physically abused: Not on file    Forced sexual activity: Not on file  Other Topics Concern  . Not on file  Social History Narrative  . Not on file    Review of Systems: See HPI, otherwise negative ROS  Physical Exam: BP  132/78   Temp 97.7 F (36.5 C) (Tympanic)   Ht 5\' 5"  (1.651 m)   Wt (!) 149.7 kg   LMP 08/12/2018 (Exact Date) Comment: "have a double uterus"  SpO2 98%   BMI 54.91 kg/m  General:   Alert,  pleasant and cooperative in NAD Head:  Normocephalic and atraumatic. Neck:  Supple; no masses or thyromegaly. Lungs:  Clear throughout to auscultation, normal respiratory effort.    Heart:  +S1, +S2, Regular rate and rhythm, No edema. Abdomen:  Soft, nontender and nondistended. Normal bowel sounds, without guarding, and without rebound.   Neurologic:  Alert and  oriented x4;  grossly normal neurologically.  Impression/Plan: [name] is here for a colonoscopy to be performed for BRBPR Risks, benefits, limitations, and alternatives regarding  colonoscopy  have been reviewed with the patient.  Questions have been answered.  All parties agreeable.   [name], MD  08/12/2018, 11:24 AM

## 2018-08-12 NOTE — Anesthesia Post-op Follow-up Note (Signed)
Anesthesia QCDR form completed.        

## 2018-08-12 NOTE — Anesthesia Preprocedure Evaluation (Signed)
Anesthesia Evaluation  Patient identified by MRN, date of birth, ID band Patient awake    Reviewed: Allergy & Precautions, H&P , NPO status , Patient's Chart, lab work & pertinent test results  Airway Mallampati: II  TM Distance: >3 FB Neck ROM: full    Dental  (+) Chipped   Pulmonary neg pulmonary ROS, neg shortness of breath,           Cardiovascular Exercise Tolerance: Good (-) angina(-) Past MI and (-) DOE negative cardio ROS       Neuro/Psych PSYCHIATRIC DISORDERS negative neurological ROS  negative psych ROS   GI/Hepatic negative GI ROS, Neg liver ROS, neg GERD  ,  Endo/Other  Morbid obesity  Renal/GU negative Renal ROS  negative genitourinary   Musculoskeletal   Abdominal   Peds  Hematology negative hematology ROS (+)   Anesthesia Other Findings History reviewed. No pertinent past medical history.  Past Surgical History: No date: CERVIX SURGERY No date: CESAREAN SECTION 04/22/2016: CHOLECYSTECTOMY; N/A     Comment:  Procedure: LAPAROSCOPIC CHOLECYSTECTOMY;  Surgeon:               Clayburn Pert, MD;  Location: [facility] ORS;  Service:               General;  Laterality: N/A;  BMI    Body Mass Index:  54.91 kg/m      Reproductive/Obstetrics negative OB ROS                             Anesthesia Physical Anesthesia Plan  ASA: III  Anesthesia Plan: General   Post-op Pain Management:    Induction: Intravenous  PONV Risk Score and Plan: Propofol infusion and TIVA  Airway Management Planned: Natural Airway and Nasal Cannula  Additional Equipment:   Intra-op Plan:   Post-operative Plan:   Informed Consent: I have reviewed the patients History and Physical, chart, labs and discussed the procedure including the risks, benefits and alternatives for the proposed anesthesia with the patient or authorized representative who has indicated his/her understanding and acceptance.      Dental Advisory Given  Plan Discussed with: Anesthesiologist, CRNA and Surgeon  Anesthesia Plan Comments: (Patient consented for risks of anesthesia including but not limited to:  - adverse reactions to medications - risk of intubation if required - damage to teeth, lips or other oral mucosa - sore throat or hoarseness - Damage to heart, brain, lungs or loss of life  Patient voiced understanding.)        Anesthesia Quick Evaluation

## 2018-08-12 NOTE — Anesthesia Procedure Notes (Signed)
Date/Time: 08/12/2018 11:24 AM Performed by: Doreen Salvage, CRNA Pre-anesthesia Checklist: Patient identified, Emergency Drugs available, Suction available and Patient being monitored Patient Re-evaluated:Patient Re-evaluated prior to induction Oxygen Delivery Method: Nasal cannula Induction Type: IV induction Dental Injury: Teeth and Oropharynx as per pre-operative assessment  Comments: Nasal cannula with etCO2 monitoring

## 2018-08-13 LAB — SURGICAL PATHOLOGY

## 2018-08-13 NOTE — Progress Notes (Signed)
Martha Potts please let patient know, her biopsies did not show any evidence of IBD. The polyps removed from her colon were benign but precancerous. Her next colonoscopy should be in 3 yrs (please set recall and HM colonoscopy).   She should start taking metamucil daily to help bulk her stool. Avoid foods or drinks with sugar or sweetners, such as fruit juices, soda, candy or gum.

## 2018-08-14 ENCOUNTER — Telehealth: Payer: Self-pay | Admitting: Gastroenterology

## 2018-08-14 ENCOUNTER — Telehealth: Payer: Self-pay

## 2018-08-14 NOTE — Telephone Encounter (Signed)
Patient has been notified her biopsies did not show any evidence of IBD. The polyps removed from her colon were benign but precancerous. Her next colonoscopy should be in 3 yrs .  Recall will be made. Advised her to start taking metamucil daily to help bulk her stool. Avoid foods or drinks with sugar or sweetners, such as fruit juices, soda, candy or gum.  Thanks [employer]

## 2018-08-14 NOTE — Telephone Encounter (Signed)
Pt is returning a call for results

## 2018-08-14 NOTE — Telephone Encounter (Signed)
LVM for pt to call me back in regards to biopsy results.  Thanks [employer]

## 2018-08-14 NOTE — Progress Notes (Signed)
Patient called.

## 2018-10-05 DIAGNOSIS — Z638 Other specified problems related to primary support group: Secondary | ICD-10-CM | POA: Insufficient documentation

## 2018-10-05 HISTORY — DX: Other specified problems related to primary support group: Z63.8

## 2018-10-28 ENCOUNTER — Ambulatory Visit: Payer: BC Managed Care – PPO | Admitting: Gastroenterology

## 2018-11-06 ENCOUNTER — Ambulatory Visit: Payer: Self-pay | Admitting: Gastroenterology

## 2019-07-31 ENCOUNTER — Ambulatory Visit
Admission: EM | Admit: 2019-07-31 | Discharge: 2019-07-31 | Disposition: A | Discharge status: Home / Self Care | Payer: Commercial Managed Care - PPO | Attending: Emergency Medicine | Admitting: Emergency Medicine

## 2019-07-31 ENCOUNTER — Encounter: Payer: Self-pay | Admitting: Emergency Medicine

## 2019-07-31 ENCOUNTER — Other Ambulatory Visit: Payer: Self-pay

## 2019-07-31 DIAGNOSIS — Z79899 Other long term (current) drug therapy: Secondary | ICD-10-CM | POA: Diagnosis not present

## 2019-07-31 DIAGNOSIS — Z20822 Contact with and (suspected) exposure to covid-19: Secondary | ICD-10-CM | POA: Diagnosis not present

## 2019-07-31 DIAGNOSIS — Z6841 Body Mass Index (BMI) 40.0 and over, adult: Secondary | ICD-10-CM | POA: Diagnosis not present

## 2019-07-31 DIAGNOSIS — Z0189 Encounter for other specified special examinations: Secondary | ICD-10-CM

## 2019-07-31 DIAGNOSIS — K529 Noninfective gastroenteritis and colitis, unspecified: Secondary | ICD-10-CM

## 2019-07-31 DIAGNOSIS — Z793 Long term (current) use of hormonal contraceptives: Secondary | ICD-10-CM | POA: Diagnosis not present

## 2019-07-31 NOTE — ED Triage Notes (Signed)
Pt states her employer wants her to evaluated due to having nausea, vomiting, diarrhea yesterday and the evening before. She has not had any today and has been able to eat and drink today. She states she has a sore throat that started shortly after the vomiting. She will need a covid test for her employer.

## 2019-07-31 NOTE — ED Provider Notes (Signed)
Martha Potts    CSN: [id number] Arrival date & time: 07/31/19  0944      History   Chief Complaint Chief Complaint  Patient presents with  . Nausea  . covid test    HPI Martha Potts is a 29 y.o. female.   Patient presents with nausea, vomiting, diarrhea x2 days.  No nausea or vomiting today; one episode of diarrhea.  Nausea continues and patient reports a mild sore throat due to emesis.  Patient states her employer is requiring a COVID test.  She denies fever, chills, rash, cough, shortness of breath, or other symptoms.  No treatments attempted at home.    The history is provided by the patient.    History reviewed. No pertinent past medical history.  Patient Active Problem List   Diagnosis Date Noted  . Blood in stool   . Polyp of sigmoid colon   . Diarrhea   . Post traumatic stress disorder 07/30/2018  . 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 Morbid obesity ([place]) 07/25/2018  . Acute cholecystitis due to biliary calculus 04/21/2016    Past Surgical History:  Procedure Laterality Date  . CERVIX SURGERY    . CESAREAN SECTION    . CHOLECYSTECTOMY N/A 04/22/2016   Procedure: LAPAROSCOPIC CHOLECYSTECTOMY;  Surgeon: Clayburn Pert, MD;  Location: [facility] ORS;  Service: General;  Laterality: N/A;  . COLONOSCOPY WITH PROPOFOL N/A 08/12/2018   Procedure: COLONOSCOPY WITH PROPOFOL;  Surgeon: [name], MD;  Location: [facility] ENDOSCOPY;  Service: Endoscopy;  Laterality: N/A;  Urgent per Dr. Bonna Gains    OB History   No obstetric history on file.      Home Medications    Prior to Admission medications   Medication Sig Start Date End Date Taking? Authorizing Provider  ESTARYLLA 0.25-35 MG-MCG tablet Take 1 tablet by mouth daily. 06/30/19  Yes [provider]  venlafaxine XR (EFFEXOR-XR) 37.5 MG 24 hr capsule Take one (1) capsule by mouth daily for 5 days, then take two (2) capsules daily 07/30/18  Yes [provider]    cyclobenzaprine (FLEXERIL) 5 MG tablet Take 1 tablet (5 mg total) by mouth 3 (three) times daily as needed for muscle spasms. Patient not taking: Reported on 07/25/2018 04/30/18   Menshew, Dannielle Karvonen, PA-C  etonogestrel (NEXPLANON) 68 MG IMPL implant Inject into the skin.    [provider]  ibuprofen (ADVIL,MOTRIN) 800 MG tablet Take 1 tablet (800 mg total) by mouth every 8 (eight) hours as needed. 04/30/18   Menshew, Dannielle Karvonen, PA-C  venlafaxine XR (EFFEXOR-XR) 37.5 MG 24 hr capsule TAKE ONE CAPSULE BY MOUTH DAILY FOR FIVE DAYS. THEN INCREASE TO TAKE TWO CAPSULES BY MOUTH DAILY THEREAFTER 07/30/18   [provider]    Family History Family History  Problem Relation Age of Onset  . Hypertension Father     Social History Social History   Tobacco Use  . Smoking status: Never Smoker  . Smokeless tobacco: Never Used  Substance Use Topics  . Alcohol use: No  . Drug use: No     Allergies   Patient has no known allergies.   Review of Systems Review of Systems  Constitutional: Negative for chills and fever.  HENT: Negative for ear pain and sore throat.   Eyes: Negative for pain and visual disturbance.  Respiratory: Negative for cough and shortness of breath.   Cardiovascular: Negative for chest pain and palpitations.  Gastrointestinal: Positive for diarrhea, nausea and vomiting. Negative  for abdominal pain.  Genitourinary: Negative for dysuria and hematuria.  Musculoskeletal: Negative for arthralgias and back pain.  Skin: Negative for color change and rash.  Neurological: Negative for seizures and syncope.  All other systems reviewed and are negative.    Physical Exam Triage Vital Signs ED Triage Vitals  Enc Vitals Group     BP      Pulse      Resp      Temp      Temp src      SpO2      Weight      Height      Head Circumference      Peak Flow      Pain Score      Pain Loc      Pain Edu?      Excl. in [place]?    No data found.  Updated  Vital Signs BP (!) 132/92 (BP Location: Left Arm)   Pulse 90   Temp 98 F (36.7 C) (Oral)   Resp 18   Ht 5\' 5"  (1.651 m)   Wt (!) 325 lb (147.4 kg)   LMP 07/10/2019 (Approximate)   SpO2 97%   BMI 54.08 kg/m   Visual Acuity Right Eye Distance:   Left Eye Distance:   Bilateral Distance:    Right Eye Near:   Left Eye Near:    Bilateral Near:     Physical Exam Vitals and nursing note reviewed.  Constitutional:      General: She is not in acute distress.    Appearance: She is well-developed. She is obese. She is not ill-appearing.  HENT:     Head: Normocephalic and atraumatic.     Right Ear: Tympanic membrane normal.     Left Ear: Tympanic membrane normal.     Nose: Nose normal.     Mouth/Throat:     Mouth: Mucous membranes are moist.     Pharynx: Oropharynx is clear.  Eyes:     Conjunctiva/sclera: Conjunctivae normal.  Cardiovascular:     Rate and Rhythm: Normal rate and regular rhythm.     Heart sounds: No murmur.  Pulmonary:     Effort: Pulmonary effort is normal. No respiratory distress.     Breath sounds: Normal breath sounds.  Abdominal:     General: Bowel sounds are normal.     Palpations: Abdomen is soft.     Tenderness: There is no abdominal tenderness. There is no guarding or rebound.  Musculoskeletal:     Cervical back: Neck supple.  Skin:    General: Skin is warm and dry.     Findings: No rash.  Neurological:     General: No focal deficit present.     Mental Status: She is alert and oriented to person, place, and time.  Psychiatric:        Mood and Affect: Mood normal.        Behavior: Behavior normal.      UC Treatments / Results  Labs (all labs ordered are listed, but only abnormal results are displayed) Labs Reviewed  NOVEL CORONAVIRUS, NAA    EKG   Radiology No results found.  Procedures Procedures (including critical care time)  Medications Ordered in UC Medications - No data to display  Initial Impression / Assessment and  Plan / UC Course  I have reviewed the triage vital signs and the nursing notes.  Pertinent labs & imaging results that were available during my care of the patient  were reviewed by me and considered in my medical decision making (see chart for details).   Gastroenteritis. Patient request for COVID test.  Patient declined Zofran; states no n/v today.  Discussed hydration with clear liquids.  COVID test performed here.  Instructed patient to self quarantine until the test result is back.  Instructed patient to go to the emergency department if she develops severe vomiting or diarrhea, or new symptoms such as fever or abdominal pain.  Patient agrees with plan of care.     Final Clinical Impressions(s) / UC Diagnoses   Final diagnoses:  Gastroenteritis  Patient request for diagnostic testing     Discharge Instructions     Keep yourself hydrated with clear liquids, such as water, Gatorade, Pedialyte, Sprite, or ginger ale.    Your COVID test is pending.  You should self quarantine until the test result is back.    Go to the emergency department if you have severe vomiting or diarrhea; Or if you develop new symptoms such as fever or abdominal pain.        ED Prescriptions    None     PDMP not reviewed this encounter.   Sharion Balloon, NP 07/31/19 1017

## 2019-07-31 NOTE — Discharge Instructions (Signed)
Keep yourself hydrated with clear liquids, such as water, Gatorade, Pedialyte, Sprite, or ginger ale.    Your COVID test is pending.  You should self quarantine until the test result is back.    Go to the emergency department if you have severe vomiting or diarrhea; Or if you develop new symptoms such as fever or abdominal pain.

## 2019-08-01 LAB — NOVEL CORONAVIRUS, NAA (HOSP ORDER, SEND-OUT TO REF LAB; TAT 18-24 HRS): SARS-CoV-2, NAA: NOT DETECTED

## 2019-08-25 DIAGNOSIS — G44229 Chronic tension-type headache, not intractable: Secondary | ICD-10-CM | POA: Insufficient documentation

## 2019-11-02 ENCOUNTER — Emergency Department
Admission: EM | Admit: 2019-11-02 | Discharge: 2019-11-03 | Disposition: A | Discharge status: Home / Self Care | Payer: Commercial Managed Care - PPO | Attending: Emergency Medicine | Admitting: Emergency Medicine

## 2019-11-02 ENCOUNTER — Other Ambulatory Visit: Payer: Self-pay

## 2019-11-02 ENCOUNTER — Emergency Department: Payer: Commercial Managed Care - PPO

## 2019-11-02 DIAGNOSIS — Y9389 Activity, other specified: Secondary | ICD-10-CM | POA: Diagnosis not present

## 2019-11-02 DIAGNOSIS — W01198A Fall on same level from slipping, tripping and stumbling with subsequent striking against other object, initial encounter: Secondary | ICD-10-CM | POA: Insufficient documentation

## 2019-11-02 DIAGNOSIS — S0990XA Unspecified injury of head, initial encounter: Secondary | ICD-10-CM | POA: Diagnosis not present

## 2019-11-02 DIAGNOSIS — Y929 Unspecified place or not applicable: Secondary | ICD-10-CM | POA: Insufficient documentation

## 2019-11-02 DIAGNOSIS — R519 Headache, unspecified: Secondary | ICD-10-CM

## 2019-11-02 DIAGNOSIS — Y999 Unspecified external cause status: Secondary | ICD-10-CM | POA: Diagnosis not present

## 2019-11-02 LAB — COMPREHENSIVE METABOLIC PANEL
ALT: 18 U/L (ref 0–44)
AST: 21 U/L (ref 15–41)
Albumin: 3.6 g/dL (ref 3.5–5.0)
Alkaline Phosphatase: 97 U/L (ref 38–126)
Anion gap: 12 (ref 5–15)
BUN: 11 mg/dL (ref 6–20)
CO2: 20 mmol/L — ABNORMAL LOW (ref 22–32)
Calcium: 8.9 mg/dL (ref 8.9–10.3)
Chloride: 103 mmol/L (ref 98–111)
Creatinine, Ser: 0.66 mg/dL (ref 0.44–1.00)
GFR calc Af Amer: >60 mL/min (ref >60)
GFR calc non Af Amer: >60 mL/min (ref >60)
Glucose, Bld: 99 mg/dL (ref 70–99)
Potassium: 3.5 mmol/L (ref 3.5–5.1)
Sodium: 135 mmol/L (ref 135–145)
Total Bilirubin: 0.6 mg/dL (ref 0.3–1.2)
Total Protein: 7.3 g/dL (ref 6.5–8.1)

## 2019-11-02 LAB — CBC
HCT: 36 % (ref 36.0–46.0)
Hemoglobin: 12.8 g/dL (ref 12.0–15.0)
MCH: 29.1 pg (ref 26.0–34.0)
MCHC: 35.6 g/dL (ref 30.0–36.0)
MCV: 81.8 fL (ref 80.0–100.0)
Platelets: 355 10*3/uL (ref 150–400)
RBC: 4.4 MIL/uL (ref 3.87–5.11)
RDW: 13 % (ref 11.5–15.5)
WBC: 9.6 10*3/uL (ref 4.0–10.5)
nRBC: 0 % (ref 0.0–0.2)

## 2019-11-02 LAB — URINALYSIS, COMPLETE (UACMP) WITH MICROSCOPIC
Bilirubin Urine: NEGATIVE
Glucose, UA: NEGATIVE mg/dL
Hgb urine dipstick: NEGATIVE
Ketones, ur: NEGATIVE mg/dL
Leukocytes,Ua: NEGATIVE
Nitrite: NEGATIVE
Protein, ur: 30 mg/dL — AB
Specific Gravity, Urine: 1.029 (ref 1.005–1.030)
pH: 5 (ref 5.0–8.0)

## 2019-11-02 LAB — GLUCOSE, CAPILLARY: Glucose-Capillary: 104 mg/dL — ABNORMAL HIGH (ref 70–99)

## 2019-11-02 LAB — POCT PREGNANCY, URINE: Preg Test, Ur: NEGATIVE

## 2019-11-02 MED ORDER — BUTALBITAL-APAP-CAFFEINE 50-325-40 MG PO TABS: 1.0000 | ORAL_TABLET | Freq: Four times a day (QID) | ORAL | 0 refills | Status: AC | PRN

## 2019-11-02 MED ORDER — METOCLOPRAMIDE HCL 5 MG/ML IJ SOLN
10.0000 mg | Freq: Once | INTRAMUSCULAR | Status: AC
  Administered 2019-11-02: 10 mg via INTRAVENOUS
  Filled 2019-11-02: qty 2

## 2019-11-02 MED ORDER — SODIUM CHLORIDE 0.9 % IV BOLUS
1000.0000 mL | Freq: Once | INTRAVENOUS | Status: AC
  Administered 2019-11-02: 1000 mL via INTRAVENOUS

## 2019-11-02 MED ORDER — KETOROLAC TROMETHAMINE 30 MG/ML IJ SOLN
15.0000 mg | Freq: Once | INTRAMUSCULAR | Status: AC
  Administered 2019-11-02: 15 mg via INTRAVENOUS
  Filled 2019-11-02: qty 1

## 2019-11-02 MED ORDER — LORAZEPAM 2 MG/ML IJ SOLN
0.5000 mg | Freq: Once | INTRAMUSCULAR | Status: AC
  Administered 2019-11-02: 0.5 mg via INTRAVENOUS
  Filled 2019-11-02: qty 1

## 2019-11-02 MED ORDER — DIPHENHYDRAMINE HCL 50 MG/ML IJ SOLN
50.0000 mg | Freq: Once | INTRAMUSCULAR | Status: AC
  Administered 2019-11-02: 50 mg via INTRAVENOUS
  Filled 2019-11-02: qty 1

## 2019-11-02 NOTE — ED Provider Notes (Signed)
Patient is resting at this time.  Her headache is gone away.  She is in no distress and feels improved.  She would like to have a work note in case he does not go back to work tomorrow which I think is reasonable.  Discussed return precautions with patient and her cousin who is also here and will be driving her home.  Advised patient not to drive this evening because she received medications that can also be sedating.  She is comfortable with that  She feels improved.  Await urinalysis, if this looks reassuring the patient feels much improved anticipate discharge to home.   Delman Kitten, MD 11/02/19 2149

## 2019-11-02 NOTE — ED Provider Notes (Signed)
Patient resting comfortably no distress.  Reports she feels well.  Comfortable with plan for discharge.  Reviewed urinalysis shows many bacteria, no nitrites no leuks, and appears contaminated.  Discussed with the patient she denies any fevers does not have any burning or discomfort with urination.  Did discuss with the patient we will culture, I do not see indication for antibiotic treatment at this time unless culture concerning for possible UTI or patient becoming symptomatic.  Appears likely contaminated  Return precautions and treatment recommendations and follow-up discussed with the patient who is agreeable with the plan.    Delman Kitten, MD 11/02/19 2332

## 2019-11-02 NOTE — ED Notes (Signed)
EKG below not on this patient.

## 2019-11-02 NOTE — ED Provider Notes (Signed)
[facility] Emergency Department Provider Note  Time seen: 7:32 PM  I have reviewed the triage vital signs and the nursing notes.   HISTORY  Chief Complaint Fall   HPI Martha Potts is a 29 y.o. female with a past medical history of obesity, presents to the emergency department after head injury and continued headache and weakness.  According to the patient Saturday night while getting out of a hot tub she slipped and hit the back of her head.  She states since the head injury she has had a moderate headache with generalized weakness sensation.  Patient states she continued to have a headache today so her husband made her come to the emergency department for evaluation.  While in the triage room patient had a near syncopal episode, but states she thinks she is having a "panic attack."  Patient states a history of anxiety and panic attacks in the past.  Describes weakness throughout all of her arms and legs but denies any focal numbness or weakness.   History reviewed. No pertinent past medical history.  Patient Active Problem List   Diagnosis Date Noted  . Blood in stool   . Polyp of sigmoid colon   . Diarrhea   . Post traumatic stress disorder 07/30/2018  . 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 Morbid obesity ([place]) 07/25/2018  . Acute cholecystitis due to biliary calculus 04/21/2016    Past Surgical History:  Procedure Laterality Date  . CERVIX SURGERY    . CESAREAN SECTION    . CHOLECYSTECTOMY N/A 04/22/2016   Procedure: LAPAROSCOPIC CHOLECYSTECTOMY;  Surgeon: Clayburn Pert, MD;  Location: [facility] ORS;  Service: General;  Laterality: N/A;  . COLONOSCOPY WITH PROPOFOL N/A 08/12/2018   Procedure: COLONOSCOPY WITH PROPOFOL;  Surgeon: [name], MD;  Location: [facility] ENDOSCOPY;  Service: Endoscopy;  Laterality: N/A;  Urgent per Dr. Bonna Gains    Prior to Admission medications   Medication Sig Start Date End Date Taking? Authorizing  Provider  cyclobenzaprine (FLEXERIL) 5 MG tablet Take 1 tablet (5 mg total) by mouth 3 (three) times daily as needed for muscle spasms. Patient not taking: Reported on 07/25/2018 04/30/18   Menshew, Dannielle Karvonen, PA-C  ESTARYLLA 0.25-35 MG-MCG tablet Take 1 tablet by mouth daily. 06/30/19   [provider]  etonogestrel (NEXPLANON) 68 MG IMPL implant Inject into the skin.    [provider]  ibuprofen (ADVIL,MOTRIN) 800 MG tablet Take 1 tablet (800 mg total) by mouth every 8 (eight) hours as needed. 04/30/18   Menshew, Dannielle Karvonen, PA-C  venlafaxine XR (EFFEXOR-XR) 37.5 MG 24 hr capsule Take one (1) capsule by mouth daily for 5 days, then take two (2) capsules daily 07/30/18   [provider]  venlafaxine XR (EFFEXOR-XR) 37.5 MG 24 hr capsule TAKE ONE CAPSULE BY MOUTH DAILY FOR FIVE DAYS. THEN INCREASE TO TAKE TWO CAPSULES BY MOUTH DAILY THEREAFTER 07/30/18   [provider]    No Known Allergies  Family History  Problem Relation Age of Onset  . Hypertension Father     Social History Social History   Tobacco Use  . Smoking status: Never Smoker  . Smokeless tobacco: Never Used  Vaping Use  . Vaping Use: Never used  Substance Use Topics  . Alcohol use: No  . Drug use: No    Review of Systems Constitutional: Negative for fever.  Positive for generalized weakness Cardiovascular: Negative for chest pain. Respiratory: Negative for shortness of breath. Gastrointestinal:  Negative for abdominal pain, vomiting and diarrhea. Genitourinary: LMP 10/20/2019 Musculoskeletal: Negative for musculoskeletal complaints Skin: Negative for skin complaints  Neurological: Moderate headache All other ROS negative  ____________________________________________   PHYSICAL EXAM:  VITAL SIGNS: ED Triage Vitals  Enc Vitals Group     BP 11/02/19 1911 108/85     Pulse Rate 11/02/19 1911 (!) 118     Resp 11/02/19 1911 17     Temp 11/02/19 1911 98.5 F (36.9 C)      Temp Source 11/02/19 1911 Oral     SpO2 11/02/19 1911 98 %     Weight 11/02/19 1912 (!) 308 lb (139.7 kg)     Height 11/02/19 1912 5\' 5"  (1.651 m)     Head Circumference --      Peak Flow --      Pain Score 11/02/19 1922 7     Pain Loc --      Pain Edu? --      Excl. in [place]? --    Constitutional: Alert and oriented.  Somnolent, keeps eyes closed most of the exam but answers questions appropriately. Eyes: Normal exam ENT      Head: Normocephalic and atraumatic.      Mouth/Throat: Mucous membranes are moist. Cardiovascular: Normal rate, regular rhythm.  Respiratory: Normal respiratory effort without tachypnea nor retractions. Breath sounds are clear Gastrointestinal: Soft and nontender. No distention.   Musculoskeletal: Nontender with normal range of motion in all extremities.  Neurologic:  Normal speech and language. No gross focal neurologic deficits Skin:  Skin is warm.  Mild diaphoresis. Psychiatric: Mood and affect are normal.   ____________________________________________    EKG  EKG viewed and interpreted by myself shows a normal sinus rhythm at 86 bpm with a narrow QRS, normal axis, normal intervals, no concerning ST changes  ____________________________________________    RADIOLOGY  CT scan is negative for acute abnormality  ____________________________________________   INITIAL IMPRESSION / ASSESSMENT AND PLAN / ED COURSE  Pertinent labs & imaging results that were available during my care of the patient were reviewed by me and considered in my medical decision making (see chart for details).   Patient presents emergency department for headache after a head injury 2 days ago and generalized weakness.  Patient brought back to the emergency department after feeling very weak in the triage room but states she believes she is having a "panic attack."  We will check labs, obtain a CT scan of the head to further evaluate.  We will dose IV fluids and half a milligram  Ativan.  Last menstrual period was approximately 2 weeks ago.  CT scans negative for acute abnormality.  Labs are largely within normal limits, urine is pending.  Patient states she is feeling somewhat better but still is complaining of a moderate headache.  We will dose Toradol Reglan Benadryl.  Patient still has approximately 500 cc of fluid remaining.  We will reassess after treatment and urinalysis results.  Patient care signed out to oncoming provider.  AIANA NORDQUIST was evaluated in Emergency Department on 11/02/2019 for the symptoms described in the history of present illness. She was evaluated in the context of the global COVID-19 pandemic, which necessitated consideration that the patient might be at risk for infection with the SARS-CoV-2 virus that causes COVID-19. Institutional protocols and algorithms that pertain to the evaluation of patients at risk for COVID-19 are in a state of rapid change based on information released by regulatory bodies including the CDC and federal  and state organizations. These policies and algorithms were followed during the patient's care in the ED.  ____________________________________________   FINAL CLINICAL IMPRESSION(S) / ED DIAGNOSES  Head injury Headache   Harvest Dark, MD 11/02/19 2029

## 2019-11-02 NOTE — ED Triage Notes (Signed)
Pt comes to ED today due to a fall Saturday getting out of a hot tube. Pt states she is unsure if she lost consciousness. Pt states that starting Sunday she began to experience a headache and dizziness.  Today, Pt complains of same. States she now has diarrhea and is unable to eat due to making her feel sick. Pt complains of pain in the back of the head and neck. Pt experienced a syncopal episode in triage with this nurse and Alwyn Pea. Pt moved to tx room 14 at that time. Prior to event pt complained of sweating and dizziness prior to passing out in wheelchair.

## 2019-11-05 LAB — URINE CULTURE: Culture: 50000 — AB

## 2019-12-09 ENCOUNTER — Emergency Department
Admission: EM | Admit: 2019-12-09 | Discharge: 2019-12-09 | Discharge status: Against Medical Advice | Payer: Commercial Managed Care - PPO

## 2020-02-08 ENCOUNTER — Other Ambulatory Visit (HOSPITAL_COMMUNITY)
Admission: RE | Admit: 2020-02-08 | Discharge: 2020-02-08 | Disposition: A | Discharge status: Home / Self Care | Payer: Commercial Managed Care - PPO | Source: Ambulatory Visit | Attending: Obstetrics & Gynecology | Admitting: Obstetrics & Gynecology

## 2020-02-08 ENCOUNTER — Other Ambulatory Visit: Payer: Self-pay

## 2020-02-08 ENCOUNTER — Ambulatory Visit (INDEPENDENT_AMBULATORY_CARE_PROVIDER_SITE_OTHER): Payer: Commercial Managed Care - PPO | Admitting: Obstetrics & Gynecology

## 2020-02-08 ENCOUNTER — Encounter: Payer: Self-pay | Admitting: Obstetrics & Gynecology

## 2020-02-08 VITALS — BP 120/80 | Ht 65.0 in | Wt 306.0 lb

## 2020-02-08 DIAGNOSIS — Z124 Encounter for screening for malignant neoplasm of cervix: Secondary | ICD-10-CM | POA: Diagnosis present

## 2020-02-08 DIAGNOSIS — Z01419 Encounter for gynecological examination (general) (routine) without abnormal findings: Secondary | ICD-10-CM

## 2020-02-08 DIAGNOSIS — Z23 Encounter for immunization: Secondary | ICD-10-CM | POA: Diagnosis not present

## 2020-02-08 MED ORDER — ETONOGESTREL-ETHINYL ESTRADIOL 0.12-0.015 MG/24HR VA RING: VAGINAL_RING | VAGINAL | 11 refills | Status: DC

## 2020-02-08 NOTE — Progress Notes (Signed)
HPI:      [name] is a 29 y.o. [id number] WF who LMP was Patient's last menstrual period was 02/01/2020., she presents today for her annual examination. The patient has no complaints today other than heavy and crampy painful periods.  Monthly on OCPs.  Nexplanon- irreg and weight gain.. The patient is sexually active. Her last pap: was normal. The patient does perform self breast exams.  There is no notable family history of breast or ovarian cancer in her family.  The patient has regular exercise: yes.  The patient denies current symptoms of depression.    GYN History: Contraception: OCP (estrogen/progesterone)  H/o double uterus or bicornuate uterus  PMHx: History reviewed. No pertinent past medical history. Past Surgical History:  Procedure Laterality Date  . CERVIX SURGERY    . CESAREAN SECTION    . CHOLECYSTECTOMY N/A 04/22/2016   Procedure: LAPAROSCOPIC CHOLECYSTECTOMY;  Surgeon: Clayburn Pert, MD;  Location: [facility] ORS;  Service: General;  Laterality: N/A;  . COLONOSCOPY WITH PROPOFOL N/A 08/12/2018   Procedure: COLONOSCOPY WITH PROPOFOL;  Surgeon: [name], MD;  Location: [facility] ENDOSCOPY;  Service: Endoscopy;  Laterality: N/A;  Urgent per Dr. Bonna Gains   Family History  Problem Relation Age of Onset  . Hypertension Father   . Hodgkin's lymphoma Maternal Grandmother   . Lung cancer Paternal Grandfather   . Cervical cancer Maternal Aunt    Social History   Tobacco Use  . Smoking status: Never Smoker  . Smokeless tobacco: Never Used  Vaping Use  . Vaping Use: Never used  Substance Use Topics  . Alcohol use: No  . Drug use: No    Current Outpatient Medications:  .  butalbital-acetaminophen-caffeine (FIORICET) 50-325-40 MG tablet, Take 1-2 tablets by mouth every 6 (six) hours as needed for headache., Disp: 20 tablet, Rfl: 0 .  ESTARYLLA 0.25-35 MG-MCG tablet, Take 1 tablet by mouth daily., Disp: , Rfl:  .  venlafaxine XR (EFFEXOR-XR) 37.5 MG 24 hr  capsule, Take one (1) capsule by mouth daily for 5 days, then take two (2) capsules daily, Disp: , Rfl:  .  cyclobenzaprine (FLEXERIL) 5 MG tablet, Take 1 tablet (5 mg total) by mouth 3 (three) times daily as needed for muscle spasms. (Patient not taking: Reported on 07/25/2018), Disp: 15 tablet, Rfl: 0 .  etonogestrel (NEXPLANON) 68 MG IMPL implant, Inject into the skin., Disp: , Rfl:  .  ibuprofen (ADVIL,MOTRIN) 800 MG tablet, Take 1 tablet (800 mg total) by mouth every 8 (eight) hours as needed., Disp: 30 tablet, Rfl: 0 .  venlafaxine XR (EFFEXOR-XR) 37.5 MG 24 hr capsule, TAKE ONE CAPSULE BY MOUTH DAILY FOR FIVE DAYS. THEN INCREASE TO TAKE TWO CAPSULES BY MOUTH DAILY THEREAFTER (Patient not taking: Reported on 02/08/2020), Disp: , Rfl:  Allergies: Patient has no known allergies.  Review of Systems  Constitutional: Negative for chills, fever and malaise/fatigue.  HENT: Negative for congestion, sinus pain and sore throat.   Eyes: Negative for blurred vision and pain.  Respiratory: Negative for cough and wheezing.   Cardiovascular: Negative for chest pain and leg swelling.  Gastrointestinal: Negative for abdominal pain, constipation, diarrhea, heartburn, nausea and vomiting.  Genitourinary: Negative for dysuria, frequency, hematuria and urgency.  Musculoskeletal: Negative for back pain, joint pain, myalgias and neck pain.  Skin: Negative for itching and rash.  Neurological: Negative for dizziness, tremors and weakness.  Endo/Heme/Allergies: Does not bruise/bleed easily.  Psychiatric/Behavioral: Negative for depression. The patient is not nervous/anxious and does not  have insomnia.     Objective: BP 120/80   Ht 5\' 5"  (1.651 m)   Wt (!) 306 lb (138.8 kg)   LMP 02/01/2020   BMI 50.92 kg/m   Filed Weights   02/08/20 1432  Weight: (!) 306 lb (138.8 kg)   Body mass index is 50.92 kg/m. Physical Exam Constitutional:      General: She is not in acute distress.    Appearance: She is  well-developed.  Genitourinary:     Pelvic exam was performed with patient supine.     Vagina, uterus and rectum normal.     No lesions in the vagina.     No vaginal bleeding.     No cervical motion tenderness, friability, lesion or polyp.     Uterus is mobile.     Uterus is not enlarged.     No uterine mass detected.    Uterus is midaxial.     No right or left adnexal mass present.     Right adnexa not tender.     Left adnexa not tender.  HENT:     Head: Normocephalic and atraumatic. No laceration.     Right Ear: Hearing normal.     Left Ear: Hearing normal.     Mouth/Throat:     Pharynx: Uvula midline.  Eyes:     Pupils: Pupils are equal, round, and reactive to light.  Neck:     Thyroid: No thyromegaly.  Cardiovascular:     Rate and Rhythm: Normal rate and regular rhythm.     Heart sounds: No murmur heard.  No friction rub. No gallop.   Pulmonary:     Effort: Pulmonary effort is normal. No respiratory distress.     Breath sounds: Normal breath sounds. No wheezing.  Chest:     Breasts:        Right: No mass, skin change or tenderness.        Left: No mass, skin change or tenderness.  Abdominal:     General: Bowel sounds are normal. There is no distension.     Palpations: Abdomen is soft.     Tenderness: There is no abdominal tenderness. There is no rebound.  Musculoskeletal:        General: Normal range of motion.     Cervical back: Normal range of motion and neck supple.  Neurological:     Mental Status: She is alert and oriented to person, place, and time.     Cranial Nerves: No cranial nerve deficit.  Skin:    General: Skin is warm and dry.  Psychiatric:        Judgment: Judgment normal.  Vitals reviewed.     Assessment:  ANNUAL EXAM 1. Women's annual routine gynecological examination   2. Screening for cervical cancer      Screening Plan:            1.  Cervical Screening-  Pap smear done today  2. Breast screening- Exam annually and mammogram>40  planned   3. Colonoscopy every 10 years, Hemoccult testing - after age 62  4. Labs managed by PCP  5. Counseling for contraception: NuvaRing vaginal inserts Wishes to change to better treat heavy cycles.  No pregnancy desired.  Husband also considering vasectomy. The pregnancy intention screening data noted above was reviewed. Potential methods of contraception were discussed. The patient elected to proceed with Vaginal Ring.    6. Flu shot done     F/U  Return in about 1 year (  around 02/07/2021) for Annual.  Barnett Applebaum, MD, [facility], [facility] 02/08/2020  3:06 PM

## 2020-02-08 NOTE — Patient Instructions (Addendum)
Thank you for choosing [facility]. As part of our ongoing efforts to improve patient experience, we would appreciate your feedback. Please fill out the short survey that you will receive by mail or MyChart. Your opinion is important to [place]! -Dr Kenton Kingfisher  Ethinyl Estradiol; Etonogestrel vaginal ring What is this medicine? ETHINYL ESTRADIOL; ETONOGESTREL (ETH in il es tra DYE ole; et oh noe JES trel) vaginal ring is a flexible, vaginal ring used as a contraceptive (birth control method). This medicine combines 2 types of female hormones, an estrogen and a progestin. This ring is used to prevent ovulation and pregnancy. Each ring is effective for 1 month. This medicine may be used for other purposes; ask your health care provider or pharmacist if you have questions. COMMON BRAND NAME(S): EluRyng, NuvaRing What should I tell my health care provider before I take this medicine? They need to know if you have any of these conditions:  abnormal vaginal bleeding  blood vessel disease or blood clots  breast, cervical, endometrial, ovarian, liver, or uterine cancer  diabetes  gallbladder disease  having surgery  heart disease or recent heart attack  high blood pressure  high cholesterol or triglycerides  history of irregular heartbeat or heart valve problems  kidney disease  liver disease  migraine headaches  protein C deficiency  protein S deficiency  recently had a baby, miscarriage, or abortion  stroke  systemic lupus erythematosus (SLE)  tobacco smoker  your age is more than 27 years old  an unusual or allergic reaction to estrogens, progestins, other medicines, foods, dyes, or preservatives  pregnant or trying to get pregnant  breast-feeding How should I use this medicine? Insert the ring into your vagina as directed. Follow the directions on the prescription label. The ring will remain place for 3 weeks and is then removed for a 1-week break. A new ring is  inserted 1 week after the last ring was removed, on the same day of the week. Check often to make sure the ring is still in place. If the ring was out of the vagina for an unknown amount of time, you may not be protected from pregnancy. Perform a pregnancy test and call your doctor. Do not use more often than directed. A patient package insert for the product will be given with each prescription and refill. Read this sheet carefully each time. The sheet may change frequently. Contact your pediatrician regarding the use of this medicine in children. Special care may be needed. Overdosage: If you think you have taken too much of this medicine contact a poison control center or emergency room at once. NOTE: This medicine is only for you. Do not share this medicine with others. What if I miss a dose? You will need to use the ring exactly as directed. It is very important to follow the schedule every cycle. If you do not use the ring as directed, you may not be protected from pregnancy. If the ring should slip out, is lost, or if you leave it in longer or shorter than you should, contact your health care professional for advice. What may interact with this medicine? Do not take this medicine with the following medications:  dasabuvir; ombitasvir; paritaprevir; ritonavir  ombitasvir; paritaprevir; ritonavir  vaginal lubricants or other vaginal products that are oil-based or silicone-based This medicine may also interact with the following medications:  acetaminophen  antibiotics or medicines for infections, especially rifampin, rifabutin, rifapentine, and griseofulvin, and possibly penicillins or tetracyclines  aprepitant or fosaprepitant  armodafinil  ascorbic acid (vitamin C)  barbiturate medicines, such as phenobarbital or primidone  bosentan  certain antiviral medicines for hepatitis, HIV or AIDS  certain medicines for cancer treatment  certain medicines for seizures like  carbamazepine, clobazam, felbamate, lamotrigine, oxcarbazepine, phenytoin, rufinamide, topiramate  certain medicines for treating high cholesterol  cyclosporine  dantrolene  elagolix  flibanserin  grapefruit juice  lesinurad  medicines for diabetes  medicines to treat fungal infections, such as griseofulvin, miconazole, fluconazole, ketoconazole, itraconazole, posaconazole or voriconazole  mifepristone  mitotane  modafinil  morphine  mycophenolate  St. John's wort  tamoxifen  temazepam  theophylline or aminophylline  thyroid hormones  tizanidine  tranexamic acid  ulipristal  warfarin This list may not describe all possible interactions. Give your health care provider a list of all the medicines, herbs, non-prescription drugs, or dietary supplements you use. Also tell them if you smoke, drink alcohol, or use illegal drugs. Some items may interact with your medicine. What should I watch for while using this medicine? Visit your doctor or health care professional for regular checks on your progress. You will need a regular breast and pelvic exam and Pap smear while on this medicine. Check with your doctor or health care professional to see if you need an additional method of contraception during the first cycle that you use this ring. Female condoms (made with natural rubber latex, polyisoprene, and polyurethane) and spermicides may be used. Do not use a diaphragm, cervical cap, or a female condom, as the ring can interfere with these birth control methods and their proper placement. If you have any reason to think you are pregnant, stop using this medicine right away and contact your doctor or health care professional. If you are using this medicine for hormone related problems, it may take several cycles of use to see improvement in your condition. Smoking increases the risk of getting a blood clot or having a stroke while you are using hormonal birth control,  especially if you are more than 27 years old. You are strongly advised not to smoke. Some women are prone to getting dark patches on the skin of the face (cholasma). Your risk of getting chloasma with this medicine is higher if you had chloasma during a pregnancy. Keep out of the sun. If you cannot avoid being in the sun, wear protective clothing and use sunscreen. Do not use sun lamps or tanning beds/booths. This medicine can make your body retain fluid, making your fingers, hands, or ankles swell. Your blood pressure can go up. Contact your doctor or health care professional if you feel you are retaining fluid. If you are going to have elective surgery, you may need to stop using this medicine before the surgery. Consult your health care professional for advice. This medicine does not protect you against HIV infection (AIDS) or any other sexually transmitted diseases. What side effects may I notice from receiving this medicine? Side effects that you should report to your doctor or health care professional as soon as possible:  allergic reactions such as skin rash or itching, hives, swelling of the lips, mouth, tongue, or throat  depression  high blood pressure  migraines or severe, sudden headaches  signs and symptoms of a blood clot such as breathing problems; changes in vision; chest pain; severe, sudden headache; pain, swelling, warmth in the leg; trouble speaking; sudden numbness or weakness of the face, arm or leg  signs and symptoms of infection like fever or chills with dizziness and a  sunburn-like rash, or pain or trouble passing urine  stomach pain  symptoms of vaginal infection like itching, irritation or unusual discharge  yellowing of the eyes or skin Side effects that usually do not require medical attention (report these to your doctor or health care professional if they continue or are bothersome):  acne  breast pain, tenderness  irregular vaginal bleeding or spotting,  particularly during the first month of use  mild headache  nausea  painful periods  vomiting This list may not describe all possible side effects. Call your doctor for medical advice about side effects. You may report side effects to FDA at 1-800-FDA-1088. Where should I keep my medicine? Keep out of the reach of children. Store unopened medicine for up to 4 months at room temperature at 15 and 30 degrees C (59 and 86 degrees F). Protect from light. Do not store above 30 degrees C (86 degrees F). Throw away any unused medicine 4 months after the dispense date or the expiration date, whichever comes first. A ring may only be used for 1 cycle (1 month). After the 3-week cycle, a used ring is removed and should be placed in the re-closable foil pouch and discarded in the trash out of reach of children and pets. Do NOT flush down the toilet. NOTE: This sheet is a summary. It may not cover all possible information. If you have questions about this medicine, talk to your doctor, pharmacist, or health care provider.  2020 Elsevier/Gold Standard (2018-11-06 12:31:47)

## 2020-02-10 LAB — CYTOLOGY - PAP
Chlamydia: NEGATIVE
Comment: NEGATIVE
Comment: NORMAL
Diagnosis: NEGATIVE
Neisseria Gonorrhea: NEGATIVE

## 2021-01-04 ENCOUNTER — Other Ambulatory Visit: Payer: Self-pay

## 2021-01-04 ENCOUNTER — Emergency Department
Admission: EM | Admit: 2021-01-04 | Discharge: 2021-01-04 | Disposition: A | Discharge status: Home / Self Care | Payer: Commercial Managed Care - PPO | Attending: Emergency Medicine | Admitting: Emergency Medicine

## 2021-01-04 ENCOUNTER — Encounter: Payer: Self-pay | Admitting: Emergency Medicine

## 2021-01-04 DIAGNOSIS — Z5321 Procedure and treatment not carried out due to patient leaving prior to being seen by health care provider: Secondary | ICD-10-CM | POA: Insufficient documentation

## 2021-01-04 DIAGNOSIS — R109 Unspecified abdominal pain: Secondary | ICD-10-CM | POA: Insufficient documentation

## 2021-01-04 DIAGNOSIS — R110 Nausea: Secondary | ICD-10-CM | POA: Insufficient documentation

## 2021-01-04 LAB — COMPREHENSIVE METABOLIC PANEL
ALT: 19 U/L (ref 0–44)
AST: 21 U/L (ref 15–41)
Albumin: 3.7 g/dL (ref 3.5–5.0)
Alkaline Phosphatase: 89 U/L (ref 38–126)
Anion gap: 7 (ref 5–15)
BUN: 10 mg/dL (ref 6–20)
CO2: 25 mmol/L (ref 22–32)
Calcium: 8.8 mg/dL — ABNORMAL LOW (ref 8.9–10.3)
Chloride: 103 mmol/L (ref 98–111)
Creatinine, Ser: 0.49 mg/dL (ref 0.44–1.00)
GFR, Estimated: >60 mL/min (ref >60)
Glucose, Bld: 103 mg/dL — ABNORMAL HIGH (ref 70–99)
Potassium: 3.8 mmol/L (ref 3.5–5.1)
Sodium: 135 mmol/L (ref 135–145)
Total Bilirubin: 0.5 mg/dL (ref 0.3–1.2)
Total Protein: 7 g/dL (ref 6.5–8.1)

## 2021-01-04 LAB — CBC
HCT: 34.5 % — ABNORMAL LOW (ref 36.0–46.0)
Hemoglobin: 12.2 g/dL (ref 12.0–15.0)
MCH: 29.7 pg (ref 26.0–34.0)
MCHC: 35.4 g/dL (ref 30.0–36.0)
MCV: 83.9 fL (ref 80.0–100.0)
Platelets: 410 10*3/uL — ABNORMAL HIGH (ref 150–400)
RBC: 4.11 MIL/uL (ref 3.87–5.11)
RDW: 13.1 % (ref 11.5–15.5)
WBC: 11 10*3/uL — ABNORMAL HIGH (ref 4.0–10.5)
nRBC: 0 % (ref 0.0–0.2)

## 2021-01-04 LAB — LIPASE, BLOOD: Lipase: 29 U/L (ref 11–51)

## 2021-01-04 NOTE — ED Notes (Signed)
No answer when called several times from lobby 

## 2021-01-04 NOTE — ED Triage Notes (Signed)
Pt reports that she had and ovarian cyst on the right side last year, she developed the same type of pain today. She reports that it is a constant pain on the right side again. She has been nauseated with the pain. No vomiting or diarrhea.

## 2021-01-31 ENCOUNTER — Other Ambulatory Visit: Payer: Self-pay

## 2021-01-31 ENCOUNTER — Encounter: Payer: Self-pay | Admitting: Gastroenterology

## 2021-01-31 ENCOUNTER — Ambulatory Visit: Payer: Commercial Managed Care - PPO | Admitting: Gastroenterology

## 2021-01-31 VITALS — BP 133/95 | HR 89 | Temp 98.1°F | Wt 326.0 lb

## 2021-01-31 DIAGNOSIS — K5900 Constipation, unspecified: Secondary | ICD-10-CM | POA: Diagnosis not present

## 2021-01-31 DIAGNOSIS — R195 Other fecal abnormalities: Secondary | ICD-10-CM

## 2021-01-31 NOTE — Patient Instructions (Signed)
Start Metamucil daily

## 2021-02-01 NOTE — Progress Notes (Signed)
Martha Antigua, MD [address]  [place]  [place], [place] [address]  Main: [phone]  Fax: [phone]   Primary Care Physician: Ellene Route   Chief Complaint  Patient presents with   Blood In Stools    Pt reports lower abd cramping and lower back, but also has GYN issues and will be making appt... intermittent blood in stools not bright or black... has noticed an increase with mucus in stool... alternates constipation/diarrhea... fatigue... some intermittent nausea, no vomiting...     HPI: Martha Potts is a 30 y.o. female previously seen 2 years ago for blood in stool.  Patient underwent colonoscopy for this in April 2020, with 2 polyps in the sigmoid colon removed, 5 mm and a 10 mm polyp.  Colonoscopy was otherwise reassuring.  Patient has been doing well since then.  However, recently has noticed more mucus with stool.  May have noticed bright red blood in tissue paper once or twice recently, but not in the toilet itself.  Has had some abdominal cramping, and reports constipation with hard bowel movements requiring straining on some days, followed by 2-3 loose stools after that.  No blood streaks within the stool itself when she does have the loose stools episodes.  Random colon biopsies during her colonoscopy did not show any microscopic colitis.  There was no evidence of ileitis or IBD.  Colon polyp showed tubular adenoma and repeat was recommended in 3 years due to the size of the polyp being 10 mm in size.  Most recent labs from 01/04/2021 showed normal hemoglobin.  CMP showed normal liver enzymes.  Lipase is normal  ROS: All ROS reviewed and negative except as per HPI   No past medical history on file.  Past Surgical History:  Procedure Laterality Date   CERVIX SURGERY     CESAREAN SECTION     CHOLECYSTECTOMY N/A 04/22/2016   Procedure: LAPAROSCOPIC CHOLECYSTECTOMY;  Surgeon: Clayburn Pert, MD;  Location: [facility] ORS;  Service: General;   Laterality: N/A;   COLONOSCOPY WITH PROPOFOL N/A 08/12/2018   Procedure: COLONOSCOPY WITH PROPOFOL;  Surgeon: [name], MD;  Location: [facility] ENDOSCOPY;  Service: Endoscopy;  Laterality: N/A;  Urgent per Dr. Bonna Gains    Prior to Admission medications   Medication Sig Start Date End Date Taking? Authorizing Provider  etonogestrel-ethinyl estradiol (NUVARING) 0.12-0.015 MG/24HR vaginal ring Insert vaginally and leave in place for 3 consecutive weeks, then remove for 1 week. 02/08/20  Yes Gae Dry, MD  venlafaxine XR (EFFEXOR-XR) 37.5 MG 24 hr capsule Take one (1) capsule by mouth daily for 5 days, then take two (2) capsules daily 07/30/18  Yes [provider]    Family History  Problem Relation Age of Onset   Hypertension Father    Hodgkin's lymphoma Maternal Grandmother    Lung cancer Paternal Grandfather    Cervical cancer Maternal Aunt      Social History   Tobacco Use   Smoking status: Never   Smokeless tobacco: Never  Vaping Use   Vaping Use: Never used  Substance Use Topics   Alcohol use: No   Drug use: No    Allergies as of 01/31/2021   (No Known Allergies)    Physical Examination:  Constitutional: General:   Alert,  Well-developed, well-nourished, pleasant and cooperative in NAD BP (!) 133/95   Pulse 89   Temp 98.1 F (36.7 C) (Oral)   Wt (!) 326 lb (147.9 kg)   BMI 54.25 kg/m  Respiratory: Normal respiratory effort  Gastrointestinal:  Soft, non-tender and non-distended without masses, hepatosplenomegaly or hernias noted.  No guarding or rebound tenderness.    Rectal exam: No external hemorrhoids or perianal lesions.  DRE with yellow stool in the rectal vault, no rectal masses present.  No anal fissures or tears seen  Cardiac: No clubbing or edema.  No cyanosis. Normal posterior tibial pedal pulses noted.  Psych:  Alert and cooperative. Normal mood and affect.  Musculoskeletal:  Normal gait. Head normocephalic, atraumatic.  Symmetrical without gross deformities. 5/5 Lower extremity strength bilaterally.  Skin: Warm. Intact without significant lesions or rashes. No jaundice.  Neck: Supple, trachea midline  Lymph: No cervical lymphadenopathy  Psych:  Alert and oriented x3, Alert and cooperative. Normal mood and affect.  Labs: CMP     Component Value Date/Time   NA 135 01/04/2021 1906   NA 141 07/30/2018 1358   NA 147 (H) 04/27/2014 0247   K 3.8 01/04/2021 1906   K 3.9 04/27/2014 0247   CL 103 01/04/2021 1906   CL 107 04/27/2014 0247   CO2 25 01/04/2021 1906   CO2 24 04/27/2014 0247   GLUCOSE 103 (H) 01/04/2021 1906   GLUCOSE 74 04/27/2014 0247   BUN 10 01/04/2021 1906   BUN 10 07/30/2018 1358   BUN 9 04/27/2014 0247   CREATININE 0.49 01/04/2021 1906   CREATININE 0.72 04/27/2014 0247   CALCIUM 8.8 (L) 01/04/2021 1906   CALCIUM 9.0 04/27/2014 0247   PROT 7.0 01/04/2021 1906   PROT 6.7 07/30/2018 1358   PROT 7.8 04/27/2014 0247   ALBUMIN 3.7 01/04/2021 1906   ALBUMIN 4.1 07/30/2018 1358   ALBUMIN 3.5 04/27/2014 0247   AST 21 01/04/2021 1906   AST 19 04/27/2014 0247   ALT 19 01/04/2021 1906   ALT 28 04/27/2014 0247   ALKPHOS 89 01/04/2021 1906   ALKPHOS 109 04/27/2014 0247   BILITOT 0.5 01/04/2021 1906   BILITOT 0.2 07/30/2018 1358   BILITOT 0.3 04/27/2014 0247   GFRNONAA >60 01/04/2021 1906   GFRNONAA >60 04/27/2014 0247   GFRNONAA >60 04/21/2012 1537   GFRAA >60 11/02/2019 1942   GFRAA >60 04/27/2014 0247   GFRAA >60 04/21/2012 1537   Lab Results  Component Value Date   WBC 11.0 (H) 01/04/2021   HGB 12.2 01/04/2021   HCT 34.5 (L) 01/04/2021   MCV 83.9 01/04/2021   PLT 410 (H) 01/04/2021    Imaging Studies:   Assessment and Plan:   Martha Potts is a 30 y.o. y/o female previously seen 2 years ago for bright blood per rectum and diarrhea and underwent colonoscopy with no evidence of IBD or microscopic colitis with 2 colon polyps removed, presents with recent increase in  mucus in stool being her main complaint  Given recent colonoscopy, risk of symptoms being from IBD or malignancy is low  Abdominal bloating, cramping, that vary with her bowel movements and constipation most days of the week, followed by some loose stools subsequently is likely due to underlying constipation followed by some overflow diarrhea  Patient advised to start using Metamucil daily and if after 2 weeks is not having 1-2 soft bowel movements daily, to switch to MiraLAX.  Use MiraLAX for 2 weeks and if symptoms do not improve, patient advised to call us and she verbalized understanding  If symptoms not improved with Metamucil and MiraLAX, may need to start her on pharmacologic therapy for constipation  No indication for repeat colonoscopy at this time given  reassuring labs recently, recent colonoscopy, and reassuring clinical findings and symptoms.  No alarm symptoms present at this time    Dr Martha Potts

## 2022-02-04 IMAGING — CT CT HEAD W/O CM
3 series · 15 of 47 positions shown, 18 images · non-contrast
Comparison: January 17, 2014

CLINICAL DATA: Status post fall.

EXAM:
CT HEAD WITHOUT CONTRAST
TECHNIQUE: Contiguous axial images were obtained from the base of the skull
through the vertex without intravenous contrast.

[Series 2: head wo · axial · 0.44mm/px · z∈[+246,+371]mm · 9 of 30 slices shown, 12 images]
[im 3/30  brain]
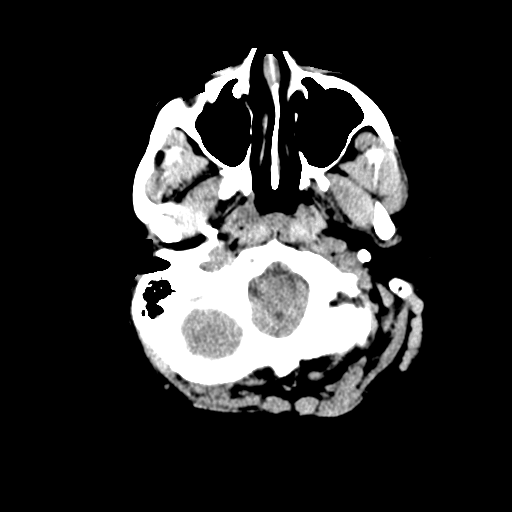
[im 3/30  bone]
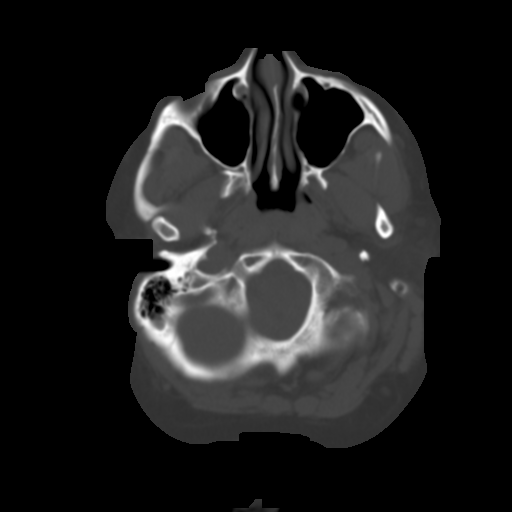
[im 6/30  brain]
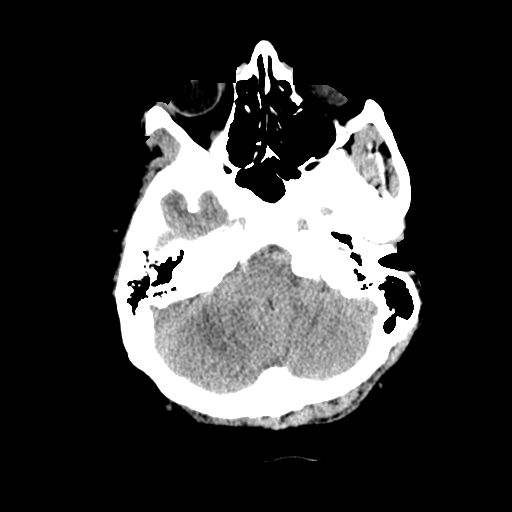
[im 9/30  brain]
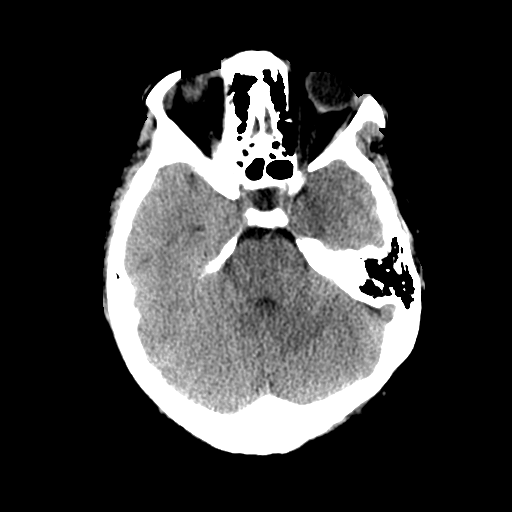
[im 12/30  brain]
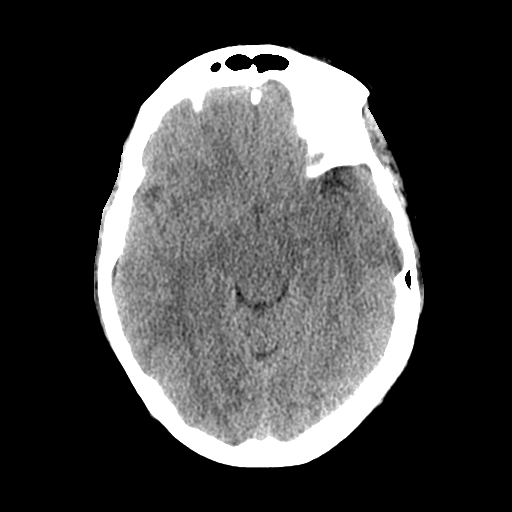
[im 16/30  brain]
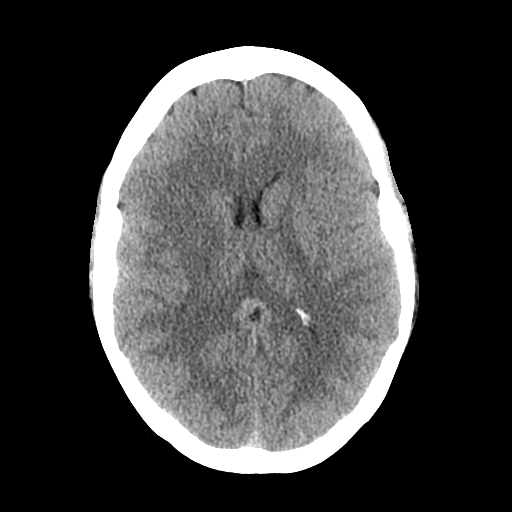
[im 16/30  bone]
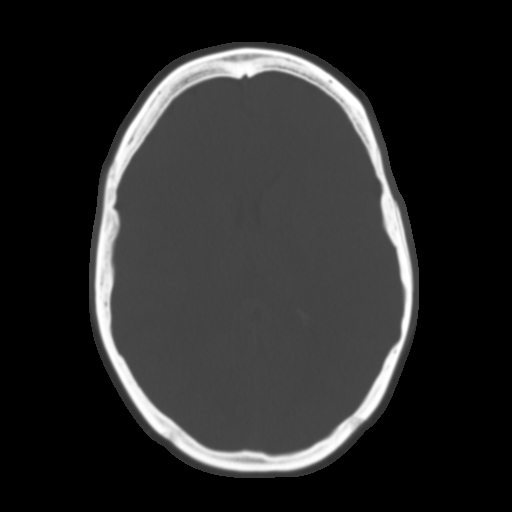
[im 19/30  brain]
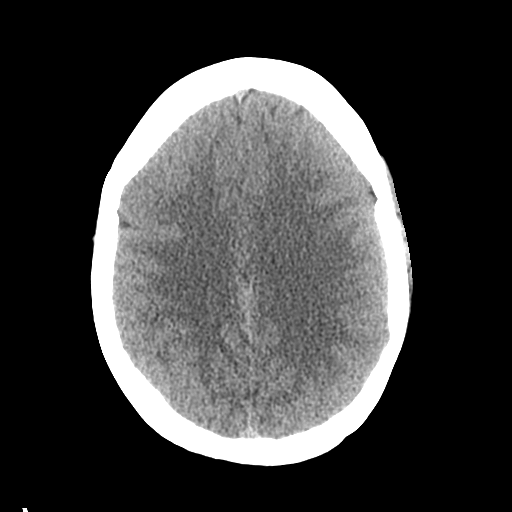
[im 22/30  brain]
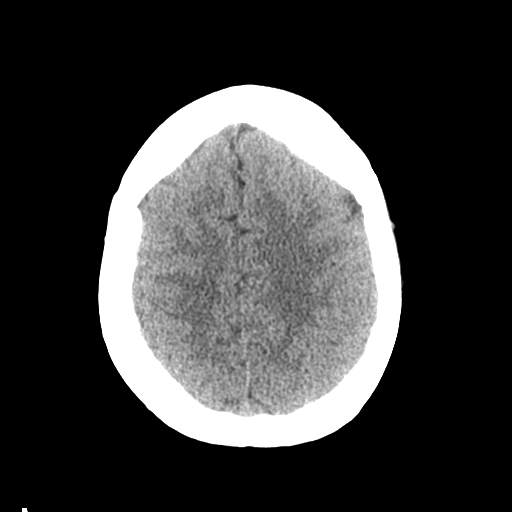
[im 25/30  brain]
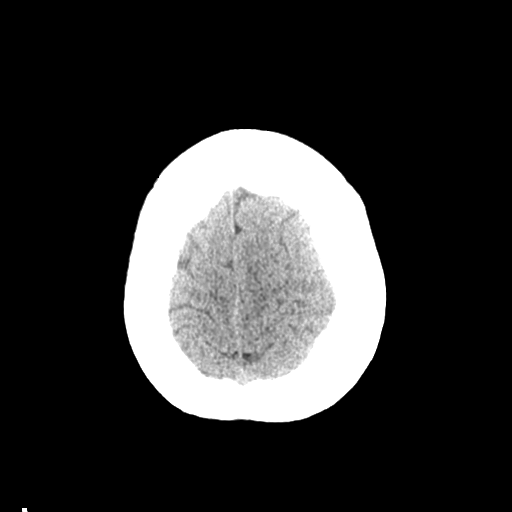
[im 28/30  brain]
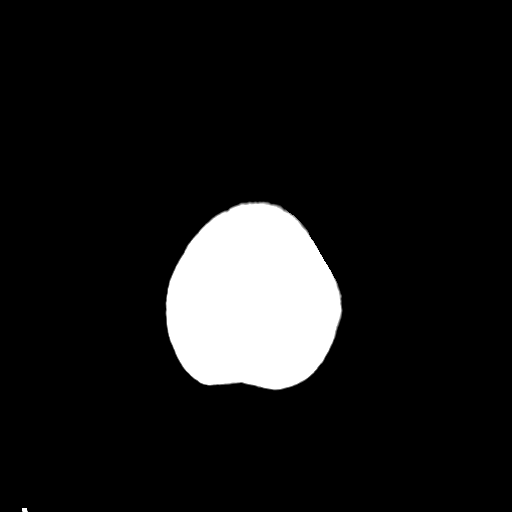
[im 28/30  bone]
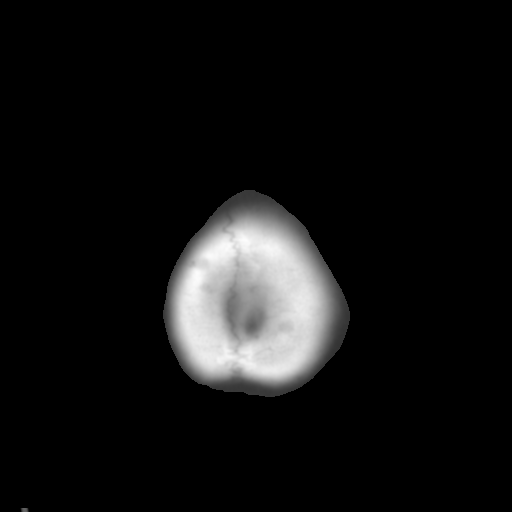

[Series 4: coronal soft tissue · coronal · 0.31mm/px · 3 of 64 slices shown]
[im 22/64  brain]
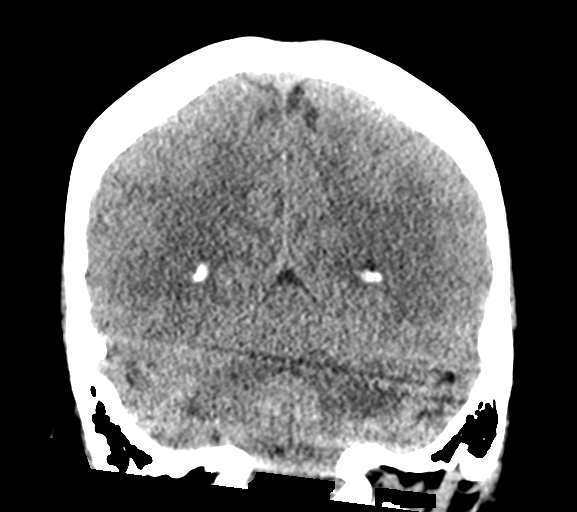
[im 29/64  brain]
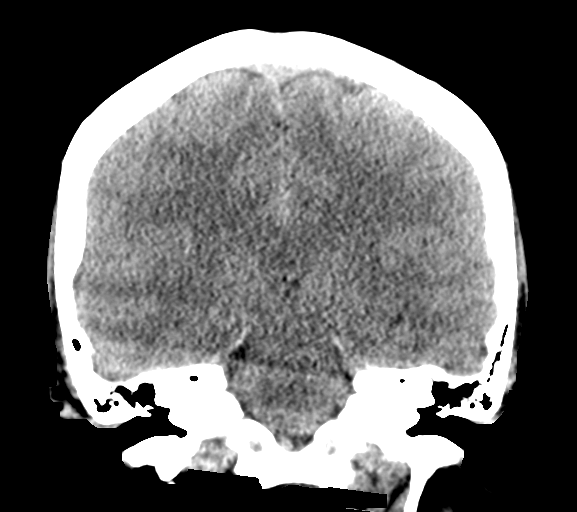
[im 36/64  brain]
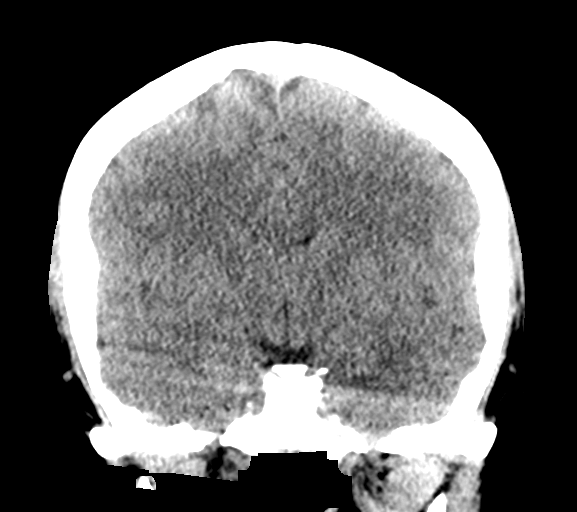

[Series 5: sagittal soft tissue · sagittal · 0.31mm/px · 3 of 51 slices shown]
[im 17/51  brain]
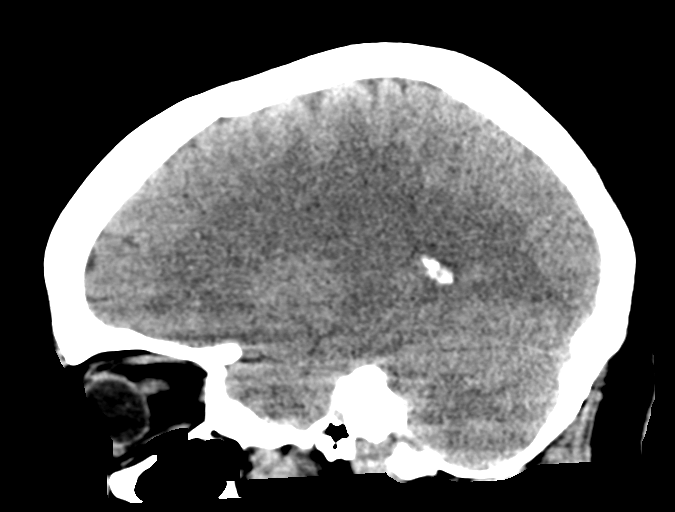
[im 26/51  brain]
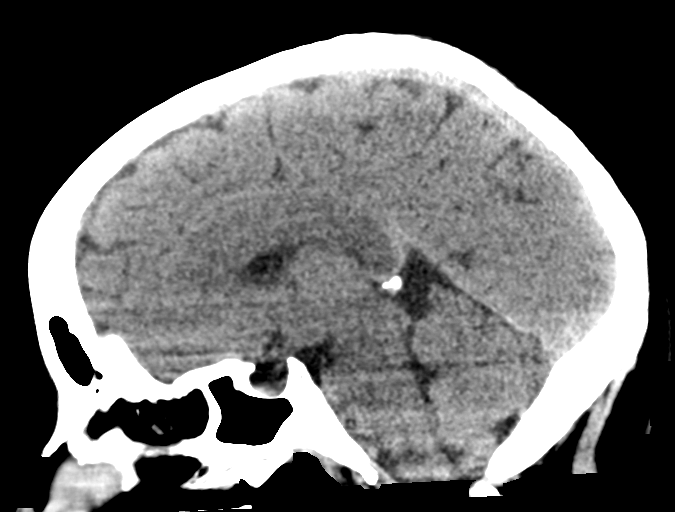
[im 34/51  brain]
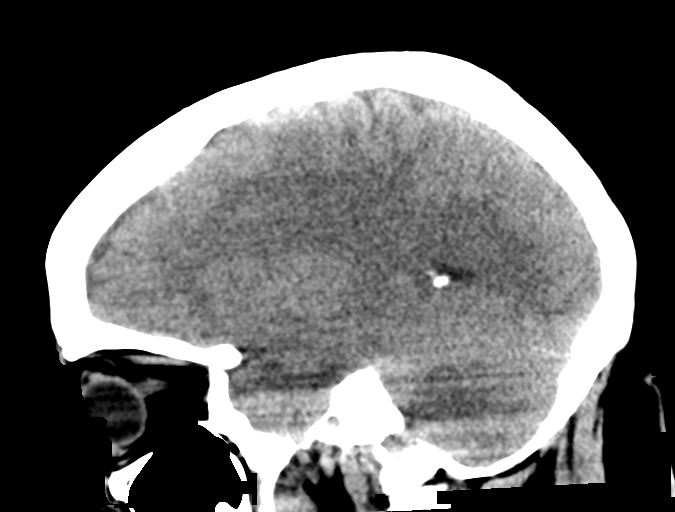

[15 of 47 positions shown; findings below may reference images not displayed]

FINDINGS: Brain: No evidence of acute infarction, hemorrhage, hydrocephalus,
extra-axial collection or mass lesion/mass effect.

Vascular: No hyperdense vessel or unexpected calcification.

Skull: Normal. Negative for fracture or focal lesion.

Sinuses/Orbits: No acute finding.

Other: None.
IMPRESSION: No acute intracranial pathology.

## 2022-11-20 ENCOUNTER — Other Ambulatory Visit: Payer: Self-pay

## 2022-11-21 NOTE — Progress Notes (Signed)
Martha Potts, Potts [address]  [address]  [place], [place] [address]  Main: [phone]  Fax: [phone]   Gastroenterology Consultation  Referring Provider:     Nira Potts Primary Care Physician:  Martha Potts Primary Gastroenterologist:  Martha Potts, Potts / Martha Potts   Reason for Consultation:     ED F/U Rectal Bleeding        HPI:   Martha Potts is a 32 y.o. y/o female referred for consultation & management  by Martha Potts.    Patient states she has had intermittent episodes of diarrhea since having cholecystectomy.  Few months ago she started having diarrhea and saw mild bright red blood in her stools.  Diarrhea is improving.  Currently having 2 loose stools per day.  Has intermittent lower abdominal cramping.  Denies weight loss.  Admits to increased stress which is a trigger for her GI symptoms.  History of IBS.  Last colonoscopy done 07/2018 by Martha Potts showed 10 mm tubular adenoma polyp removed from sigmoid colon, and another 5 mm tubular adenoma polyp removed from sigmoid colon, good prep.  No mention of hemorrhoids.  Colon biopsies showed no evidence of microscopic colitis or active colitis.  No IBD.  3-year repeat colonoscopy recommended.  Labs 11/06/2022 showed normal hemoglobin 12.5, normal CMP, normal lipase.  UA negative for blood.  Abdominal pelvic CT with contrast 11/06/2022 showed no acute abnormality.  No bowel inflammation.  No diverticulitis.  Previous cholecystectomy in 2017 due to cholelithiasis and chronic cholecystitis.  Patient last saw Martha Potts 01/2021 for follow-up of constipation.  History reviewed. No pertinent past medical history.  Past Surgical History:  Procedure Laterality Date   CERVIX SURGERY     CESAREAN SECTION     CHOLECYSTECTOMY N/A 04/22/2016   Procedure: LAPAROSCOPIC CHOLECYSTECTOMY;  Surgeon: Martha Frame, MD;  Location: [facility] ORS;  Service: General;  Laterality: N/A;    COLONOSCOPY WITH PROPOFOL N/A 08/12/2018   Procedure: COLONOSCOPY WITH PROPOFOL;  Surgeon: Martha Spillers, MD;  Location: [facility] ENDOSCOPY;  Service: Endoscopy;  Laterality: N/A;  Urgent per Martha Potts    Prior to Admission medications   Medication Sig Start Date End Date Taking? Authorizing Provider  etonogestrel-ethinyl estradiol (NUVARING) 0.12-0.015 MG/24HR vaginal ring Insert vaginally and leave in place for 3 consecutive weeks, then remove for 1 week. 02/08/20   Martha Mustard, MD  venlafaxine XR (EFFEXOR-XR) 37.5 MG 24 hr capsule Take one (1) capsule by mouth daily for 5 days, then take two (2) capsules daily 07/30/18   [provider]    Family History  Problem Relation Age of Onset   Hypertension Father    Hodgkin's lymphoma Maternal Grandmother    Lung cancer Paternal Grandfather    Cervical cancer Maternal Aunt      Social History   Tobacco Use   Smoking status: Never   Smokeless tobacco: Never  Vaping Use   Vaping status: Never Used  Substance Use Topics   Alcohol use: No   Drug use: No    Allergies as of 11/22/2022   (No Known Allergies)    Review of Systems:    All systems reviewed and negative except where noted in HPI.   Physical Exam:  BP 118/82   Pulse 83   Temp 97.7 F (36.5 C)   Ht 5\' 5"  (1.651 m)   Wt (!) 326 lb (147.9 kg)   LMP 11/09/2022   BMI 54.25 kg/m  Patient's last  menstrual period was 11/09/2022. Psych:  Alert and cooperative. Normal mood and affect. General:   Alert,  Well-developed, obese, pleasant and cooperative in NAD Head:  Normocephalic and atraumatic. Eyes:  Sclera clear, no icterus.   Conjunctiva pink. Lungs:  Respirations even and unlabored.  Clear throughout to auscultation.   No wheezes, crackles, or rhonchi. No acute distress. Heart:  Regular rate and rhythm; no murmurs, clicks, rubs, or gallops. Abdomen:  Normal bowel sounds.  No bruits.  Soft, and non-distended without masses, hepatosplenomegaly or  hernias noted.  No Tenderness.  No guarding or rebound tenderness.    Neurologic:  Alert and oriented x3;  grossly normal neurologically. Psych:  Alert and cooperative. Normal mood and affect.  Imaging Studies: No results found.  Assessment and Plan:   Martha Potts is a 32 y.o. y/o female has been referred for rectal bleeding.  She has history of adenomatous colon polyps.  She is overdue due for 3-year repeat surveillance colonoscopy.  History of IBS with diarrhea.  1.  History of adenomatous colon polyps  Scheduling Colonoscopy I discussed risks of colonoscopy with patient to include risk of bleeding, colon perforation, and risk of sedation.  Patient expressed understanding and agrees to proceed with colonoscopy.   2.  Rectal bleeding  Schedule colonoscopy.  Recent CBC showed normal hemoglobin, no anemia.  Recent abdominal pelvic CT unrevealing.  3.  Irritable bowel syndrome with diarrhea  Prescription dicyclomine 10 mg 3 times daily as needed abdominal cramping.   Follow up as needed based on colonoscopy results and GI symptoms.  Martha Potts, Potts

## 2022-11-22 ENCOUNTER — Ambulatory Visit: Payer: 59 | Admitting: Physician Assistant

## 2022-11-22 ENCOUNTER — Encounter: Payer: Self-pay | Admitting: Physician Assistant

## 2022-11-22 VITALS — BP 118/82 | HR 83 | Temp 97.7°F | Ht 65.0 in | Wt 326.0 lb

## 2022-11-22 DIAGNOSIS — K625 Hemorrhage of anus and rectum: Secondary | ICD-10-CM

## 2022-11-22 DIAGNOSIS — Z86010 Personal history of colonic polyps: Secondary | ICD-10-CM

## 2022-11-22 DIAGNOSIS — K580 Irritable bowel syndrome with diarrhea: Secondary | ICD-10-CM

## 2022-11-22 MED ORDER — PEG 3350-KCL-NA BICARB-NACL 420 G PO SOLR: 4000.0000 mL | Freq: Once | ORAL | 0 refills | Status: AC

## 2022-11-22 MED ORDER — DICYCLOMINE HCL 10 MG PO CAPS
10.0000 mg | ORAL_CAPSULE | Freq: Three times a day (TID) | ORAL | 2 refills | Status: DC
Start: 2022-11-22 — End: 2023-01-21

## 2022-12-03 ENCOUNTER — Other Ambulatory Visit: Payer: Self-pay

## 2022-12-03 ENCOUNTER — Telehealth: Payer: Self-pay

## 2022-12-03 ENCOUNTER — Telehealth: Payer: Self-pay | Admitting: Physician Assistant

## 2022-12-03 NOTE — Telephone Encounter (Signed)
Spoke with patient at this time- husband has new insurance in September-requested the colonoscopy from 8/29 to 01/21/23. Mailed new prep instructions today and patient stated she was on mychart as well.

## 2022-12-03 NOTE — Telephone Encounter (Signed)
Patient called in to schedule her procedure date she is changing insurance. Patient stated that her new insurance will not start until September.

## 2023-01-14 ENCOUNTER — Telehealth: Payer: Self-pay

## 2023-01-14 ENCOUNTER — Encounter: Payer: Self-pay | Admitting: Gastroenterology

## 2023-01-14 ENCOUNTER — Other Ambulatory Visit: Payer: Self-pay

## 2023-01-14 NOTE — Telephone Encounter (Signed)
Pt misplaced paperwork for procedure  will come by this week to pick up another set

## 2023-01-14 NOTE — Telephone Encounter (Signed)
error 

## 2023-01-18 ENCOUNTER — Encounter: Payer: Self-pay | Admitting: Gastroenterology

## 2023-01-21 ENCOUNTER — Other Ambulatory Visit: Payer: Self-pay

## 2023-01-21 ENCOUNTER — Ambulatory Visit: Payer: 59 | Admitting: Anesthesiology

## 2023-01-21 ENCOUNTER — Ambulatory Visit
Admission: RE | Admit: 2023-01-21 | Discharge: 2023-01-21 | Disposition: A | Discharge status: Home / Self Care | Payer: 59 | Attending: Gastroenterology | Admitting: Gastroenterology

## 2023-01-21 ENCOUNTER — Encounter
Admission: RE | Disposition: A | Discharge status: Home / Self Care | Payer: Self-pay | Source: Home / Self Care | Attending: Gastroenterology

## 2023-01-21 ENCOUNTER — Encounter: Payer: Self-pay | Admitting: Gastroenterology

## 2023-01-21 DIAGNOSIS — Z86010 Personal history of colonic polyps: Secondary | ICD-10-CM | POA: Diagnosis present

## 2023-01-21 DIAGNOSIS — K644 Residual hemorrhoidal skin tags: Secondary | ICD-10-CM | POA: Insufficient documentation

## 2023-01-21 DIAGNOSIS — Z1211 Encounter for screening for malignant neoplasm of colon: Secondary | ICD-10-CM | POA: Diagnosis not present

## 2023-01-21 DIAGNOSIS — K625 Hemorrhage of anus and rectum: Secondary | ICD-10-CM

## 2023-01-21 HISTORY — PX: COLONOSCOPY WITH PROPOFOL: SHX5780

## 2023-01-21 HISTORY — DX: Hemorrhage of anus and rectum: K62.5

## 2023-01-21 LAB — POCT PREGNANCY, URINE: Preg Test, Ur: NEGATIVE

## 2023-01-21 SURGERY — COLONOSCOPY WITH PROPOFOL
Anesthesia: General

## 2023-01-21 MED ORDER — LIDOCAINE HCL (CARDIAC) PF 100 MG/5ML IV SOSY
PREFILLED_SYRINGE | INTRAVENOUS | Status: DC | PRN
  Administered 2023-01-21: 100 mg via INTRAVENOUS

## 2023-01-21 MED ORDER — DEXMEDETOMIDINE HCL IN NACL 80 MCG/20ML IV SOLN
INTRAVENOUS | Status: DC | PRN
Start: 2023-01-21 — End: 2023-01-21
  Administered 2023-01-21: 8 ug via INTRAVENOUS

## 2023-01-21 MED ORDER — SODIUM CHLORIDE 0.9 % IV SOLN: INTRAVENOUS | Status: DC

## 2023-01-21 MED ORDER — PROPOFOL 10 MG/ML IV BOLUS
INTRAVENOUS | Status: DC | PRN
  Administered 2023-01-21: 50 mg via INTRAVENOUS
  Administered 2023-01-21: 200 ug/kg/min via INTRAVENOUS
  Administered 2023-01-21: 100 mg via INTRAVENOUS
  Administered 2023-01-21: 50 mg via INTRAVENOUS

## 2023-01-21 NOTE — Op Note (Signed)
[facility] Gastroenterology Patient Name: Brandie Andree Procedure Date: 01/21/2023 12:07 PM MRN: [id number] Account #: [id number] Date of Birth: 1991/04/28 Admit Type: Outpatient Age: 32 Room: [facility] ENDO ROOM 4 Gender: Female Note Status: Finalized Instrument Name: Prentice Docker [id number] Procedure:             Colonoscopy Indications:           Surveillance: Personal history of adenomatous polyps                         on last colonoscopy 3 years ago, Last colonoscopy:                         April 2020 Providers:             Toney Reil MD, MD Referring MD:          Cyndia Diver (Referring MD) Medicines:             General Anesthesia Complications:         No immediate complications. Estimated blood loss: None. Procedure:             Pre-Anesthesia Assessment:                        - Prior to the procedure, a History and Physical was                         performed, and patient medications and allergies were                         reviewed. The patient is competent. The risks and                         benefits of the procedure and the sedation options and                         risks were discussed with the patient. All questions                         were answered and informed consent was obtained.                         Patient identification and proposed procedure were                         verified by the physician, the nurse, the                         anesthesiologist, the anesthetist and the technician                         in the pre-procedure area in the procedure room in the                         endoscopy suite. Mental Status Examination: alert and                         oriented. Airway Examination: normal oropharyngeal  airway and neck mobility. Respiratory Examination:                         clear to auscultation. CV Examination: normal.                         Prophylactic Antibiotics: The  patient does not require                         prophylactic antibiotics. Prior Anticoagulants: The                         patient has taken no anticoagulant or antiplatelet                         agents. ASA Grade Assessment: II - A patient with mild                         systemic disease. After reviewing the risks and                         benefits, the patient was deemed in satisfactory                         condition to undergo the procedure. The anesthesia                         plan was to use general anesthesia. Immediately prior                         to administration of medications, the patient was                         re-assessed for adequacy to receive sedatives. The                         heart rate, respiratory rate, oxygen saturations,                         blood pressure, adequacy of pulmonary ventilation, and                         response to care were monitored throughout the                         procedure. The physical status of the patient was                         re-assessed after the procedure.                        After obtaining informed consent, the colonoscope was                         passed under direct vision. Throughout the procedure,                         the patient's blood pressure, pulse, and oxygen  saturations were monitored continuously. The                         Colonoscope was introduced through the anus and                         advanced to the the cecum, identified by appendiceal                         orifice and ileocecal valve. The colonoscopy was                         performed without difficulty. The patient tolerated                         the procedure well. The quality of the bowel                         preparation was evaluated using the BBPS [facility] Bowel                         Preparation Scale) with scores of: Right Colon = 3,                         Transverse Colon = 3 and  Left Colon = 3 (entire mucosa                         seen well with no residual staining, small fragments                         of stool or opaque liquid). The total BBPS score                         equals 9. The ileocecal valve, appendiceal orifice,                         and rectum were photographed. Findings:      The perianal and digital rectal examinations were normal. Pertinent       negatives include normal sphincter tone and no palpable rectal lesions.      The entire examined colon appeared normal.      Non-bleeding external hemorrhoids were found during retroflexion. The       hemorrhoids were small. Impression:            - The entire examined colon is normal.                        - Non-bleeding external hemorrhoids.                        - No specimens collected. Recommendation:        - Discharge patient to home (with escort).                        - Resume previous diet today.                        - Continue present medications.                        -  Repeat colonoscopy in 10 years for screening                         purposes. Procedure Code(s):     --- Professional ---                        W1191, Colorectal cancer screening; colonoscopy on                         individual at high risk Diagnosis Code(s):     --- Professional ---                        K64.4, Residual hemorrhoidal skin tags                        Z86.010, Personal history of colonic polyps CPT copyright 2022 [employer]. All rights reserved. The codes documented in this report are preliminary and upon coder review may  be revised to meet current compliance requirements. Dr. Libby Maw Toney Reil MD, MD 01/21/2023 12:22:54 PM This report has been signed electronically. Number of Addenda: 0 Note Initiated On: 01/21/2023 12:07 PM Scope Withdrawal Time: 0 hours 5 minutes 51 seconds  Total Procedure Duration: 0 hours 8 minutes 42 seconds  Estimated Blood Loss:   Estimated blood loss: none.      [facility]

## 2023-01-21 NOTE — H&P (Signed)
Arlyss Repress, MD [address]  [address]  [place], [place] [address]  Main: [phone]  Fax: [phone] Pager: [phone]  Primary Care Physician:  Cyndia Diver, PA-C Primary Gastroenterologist:  Dr. Arlyss Repress  Pre-Procedure History & Physical: HPI:  Martha Potts is a 32 y.o. female is here for an colonoscopy.   History reviewed. No pertinent past medical history.  Past Surgical History:  Procedure Laterality Date   CERVIX SURGERY     CESAREAN SECTION     CHOLECYSTECTOMY N/A 04/22/2016   Procedure: LAPAROSCOPIC CHOLECYSTECTOMY;  Surgeon: Ricarda Frame, MD;  Location: [facility] ORS;  Service: General;  Laterality: N/A;   COLONOSCOPY WITH PROPOFOL N/A 08/12/2018   Procedure: COLONOSCOPY WITH PROPOFOL;  Surgeon: Pasty Spillers, MD;  Location: [facility] ENDOSCOPY;  Service: Endoscopy;  Laterality: N/A;  Urgent per Dr. Maximino Greenland    Prior to Admission medications   Medication Sig Start Date End Date Taking? Authorizing Provider  lurasidone (LATUDA) 40 MG TABS tablet Take 40 mg by mouth daily with breakfast.   Yes [provider]  venlafaxine XR (EFFEXOR-XR) 37.5 MG 24 hr capsule Take one (1) capsule by mouth daily for 5 days, then take two (2) capsules daily 07/30/18  Yes [provider]  dicyclomine (BENTYL) 10 MG capsule Take 1 capsule (10 mg total) by mouth 4 (four) times daily -  before meals and at bedtime. Patient not taking: Reported on 01/14/2023 11/22/22 02/20/23  Celso Amy, PA-C  etonogestrel-ethinyl estradiol (NUVARING) 0.12-0.015 MG/24HR vaginal ring Insert vaginally and leave in place for 3 consecutive weeks, then remove for 1 week. Patient not taking: Reported on 01/14/2023 02/08/20   Nadara Mustard, MD    Allergies as of 11/22/2022   (No Known Allergies)    Family History  Problem Relation Age of Onset   Hypertension Father    Hodgkin's lymphoma Maternal Grandmother    Lung cancer Paternal Grandfather    Cervical  cancer Maternal Aunt     Social History   Socioeconomic History   Marital status: Married    Spouse name: Not on file   Number of children: Not on file   Years of education: Not on file   Highest education level: Not on file  Occupational History   Not on file  Tobacco Use   Smoking status: Never   Smokeless tobacco: Never  Vaping Use   Vaping status: Never Used  Substance and Sexual Activity   Alcohol use: No   Drug use: No   Sexual activity: Yes    Birth control/protection: Pill  Other Topics Concern   Not on file  Social History Narrative   Not on file   Social Determinants of Health   Financial Resource Strain: Low Risk  (08/25/2019)   Received from [facility]   Overall Financial Resource Strain (CARDIA)    Difficulty of Paying Living Expenses: Not hard at all  Food Insecurity: No Food Insecurity (08/25/2019)   Received from [facility]   Hunger Vital Sign    Worried About Running Out of Food in the Last Year: Never true    Ran Out of Food in the Last Year: Never true  Transportation Needs: No Transportation Needs (08/25/2019)   Received from [facility] - Transportation    In the past 12 months, has lack of transportation kept you from medical appointments or from getting medications?: No    Lack of Transportation (Non-Medical): No  Physical Activity: Inactive (08/25/2019)   Received from [facility]   Exercise Vital Sign    Days of Exercise per Week: 0 days    Minutes of Exercise per Session: 0 min  Stress: Stress Concern Present (08/25/2019)   Received from [facility] - Occupational Stress Questionnaire    Feeling of Stress : To some extent  Social Connections: Moderately Isolated (08/25/2019)   Received from [facility]   Social Connection and Isolation Panel [NHANES]    Frequency of Communication  with Friends and Family: More than three times a week    Frequency of Social Gatherings with Friends and Family: Never    Attends Religious Services: Never    Database administrator or Organizations: No    Attends Banker Meetings: Never    Marital Status: Married  Catering manager Violence: Not At Risk (10/05/2018)   Received from [facility]   Humiliation, Afraid, Rape, and Kick questionnaire    Fear of Current or Ex-Partner: No    Emotionally Abused: No    Physically Abused: No    Sexually Abused: No    Review of Systems: See HPI, otherwise negative ROS  Physical Exam: BP (!) 141/96   Pulse 82   Temp (!) 96.8 F (36 C) (Temporal)   Resp 18   Ht 5\' 5"  (1.651 m)   Wt (!) 145.3 kg   SpO2 99%   BMI 53.32 kg/m  General:   Alert,  pleasant and cooperative in NAD Head:  Normocephalic and atraumatic. Neck:  Supple; no masses or thyromegaly. Lungs:  Clear throughout to auscultation.    Heart:  Regular rate and rhythm. Abdomen:  Soft, nontender and nondistended. Normal bowel sounds, without guarding, and without rebound.   Neurologic:  Alert and  oriented x4;  grossly normal neurologically.  Impression/Plan: Martha Potts is here for an colonoscopy to be performed for h/o adenomatous colon polyp  Risks, benefits, limitations, and alternatives regarding  colonoscopy have been reviewed with the patient.  Questions have been answered.  All parties agreeable.   Lannette Donath, MD  01/21/2023, 11:53 AM

## 2023-01-21 NOTE — Transfer of Care (Signed)
Immediate Anesthesia Transfer of Care Note  Patient: Martha Potts  Procedure(s) Performed: COLONOSCOPY WITH PROPOFOL  Patient Location: PACU  Anesthesia Type:General  Level of Consciousness: awake and alert   Airway & Oxygen Therapy: Patient Spontanous Breathing  Post-op Assessment: Report given to RN and Post -op Vital signs reviewed and stable  Post vital signs: stable  Last Vitals:  Vitals Value Taken Time  BP 107/53 01/21/23 1230  Temp 36.1 C 01/21/23 1223  Pulse 80 01/21/23 1227  Resp 18 01/21/23 1230  SpO2 97 % 01/21/23 1227  Vitals shown include unfiled device data.  Last Pain:  Vitals:   01/21/23 1223  TempSrc: Temporal  PainSc: Asleep         Complications: No notable events documented.

## 2023-01-21 NOTE — Anesthesia Preprocedure Evaluation (Signed)
Anesthesia Evaluation  Patient identified by MRN, date of birth, ID band Patient awake    Reviewed: Allergy & Precautions, NPO status , Patient's Chart, lab work & pertinent test results  History of Anesthesia Complications Negative for: history of anesthetic complications  Airway Mallampati: II   Neck ROM: Full    Dental no notable dental hx.    Pulmonary neg pulmonary ROS   Pulmonary exam normal breath sounds clear to auscultation       Cardiovascular Exercise Tolerance: Good negative cardio ROS Normal cardiovascular exam Rhythm:Regular Rate:Normal     Neuro/Psych  PSYCHIATRIC DISORDERS (PTSD) Anxiety Depression    negative neurological ROS     GI/Hepatic negative GI ROS,,,  Endo/Other  Class 3 obesity  Renal/GU negative Renal ROS     Musculoskeletal   Abdominal   Peds  Hematology negative hematology ROS (+)   Anesthesia Other Findings   Reproductive/Obstetrics                             Anesthesia Physical Anesthesia Plan  ASA: 3  Anesthesia Plan: General   Post-op Pain Management:    Induction: Intravenous  PONV Risk Score and Plan: 3 and Propofol infusion, TIVA and Treatment may vary due to age or medical condition  Airway Management Planned: Natural Airway  Additional Equipment:   Intra-op Plan:   Post-operative Plan:   Informed Consent: I have reviewed the patients History and Physical, chart, labs and discussed the procedure including the risks, benefits and alternatives for the proposed anesthesia with the patient or authorized representative who has indicated his/her understanding and acceptance.       Plan Discussed with: CRNA  Anesthesia Plan Comments: (LMA/GETA backup discussed.  Patient consented for risks of anesthesia including but not limited to:  - adverse reactions to medications - damage to eyes, teeth, lips or other oral mucosa - nerve damage  due to positioning  - sore throat or hoarseness - damage to heart, brain, nerves, lungs, other parts of body or loss of life  Informed patient about role of CRNA in peri- and intra-operative care.  Patient voiced understanding.)       Anesthesia Quick Evaluation

## 2023-01-21 NOTE — Anesthesia Postprocedure Evaluation (Signed)
Anesthesia Post Note  Patient: Martha Potts  Procedure(s) Performed: COLONOSCOPY WITH PROPOFOL  Patient location during evaluation: PACU Anesthesia Type: General Level of consciousness: awake and alert, oriented and patient cooperative Pain management: pain level controlled Vital Signs Assessment: post-procedure vital signs reviewed and stable Respiratory status: spontaneous breathing, nonlabored ventilation and respiratory function stable Cardiovascular status: blood pressure returned to baseline and stable Postop Assessment: adequate PO intake Anesthetic complications: no   There were no known notable events for this encounter.   Last Vitals:  Vitals:   01/21/23 1233 01/21/23 1243  BP: (!) 96/59 (!) 90/49  Pulse: 73 65  Resp: 20 19  Temp:  (!) 36.1 C  SpO2: 100% 100%    Last Pain:  Vitals:   01/21/23 1243  TempSrc: Temporal  PainSc: 0-No pain                 Reed Breech

## 2023-01-22 ENCOUNTER — Encounter: Payer: Self-pay | Admitting: Gastroenterology

## 2023-03-05 ENCOUNTER — Emergency Department (HOSPITAL_COMMUNITY): Payer: 59

## 2023-03-05 ENCOUNTER — Encounter (HOSPITAL_COMMUNITY): Payer: Self-pay

## 2023-03-05 ENCOUNTER — Other Ambulatory Visit: Payer: Self-pay

## 2023-03-05 ENCOUNTER — Emergency Department (HOSPITAL_COMMUNITY)
Admission: EM | Admit: 2023-03-05 | Discharge: 2023-03-05 | Disposition: A | Discharge status: Home / Self Care | Payer: 59 | Attending: Emergency Medicine | Admitting: Emergency Medicine

## 2023-03-05 DIAGNOSIS — D72829 Elevated white blood cell count, unspecified: Secondary | ICD-10-CM | POA: Insufficient documentation

## 2023-03-05 DIAGNOSIS — M549 Dorsalgia, unspecified: Secondary | ICD-10-CM | POA: Insufficient documentation

## 2023-03-05 DIAGNOSIS — N3001 Acute cystitis with hematuria: Secondary | ICD-10-CM | POA: Insufficient documentation

## 2023-03-05 DIAGNOSIS — R300 Dysuria: Secondary | ICD-10-CM | POA: Diagnosis present

## 2023-03-05 LAB — URINALYSIS, ROUTINE W REFLEX MICROSCOPIC
Bilirubin Urine: NEGATIVE
Glucose, UA: NEGATIVE mg/dL
Ketones, ur: NEGATIVE mg/dL
Nitrite: POSITIVE — AB
Protein, ur: 100 mg/dL — AB
Specific Gravity, Urine: 1.019 (ref 1.005–1.030)
WBC, UA: >50 WBC/hpf (ref 0–5)
pH: 5 (ref 5.0–8.0)

## 2023-03-05 LAB — HEPATIC FUNCTION PANEL
ALT: 19 U/L (ref 0–44)
AST: 16 U/L (ref 15–41)
Albumin: 3.4 g/dL — ABNORMAL LOW (ref 3.5–5.0)
Alkaline Phosphatase: 102 U/L (ref 38–126)
Bilirubin, Direct: <0.1 mg/dL (ref 0.0–0.2)
Total Bilirubin: 0.3 mg/dL (ref <1.2)
Total Protein: 7 g/dL (ref 6.5–8.1)

## 2023-03-05 LAB — CBC
HCT: 38.5 % (ref 36.0–46.0)
Hemoglobin: 12.7 g/dL (ref 12.0–15.0)
MCH: 28.9 pg (ref 26.0–34.0)
MCHC: 33 g/dL (ref 30.0–36.0)
MCV: 87.5 fL (ref 80.0–100.0)
Platelets: 461 10*3/uL — ABNORMAL HIGH (ref 150–400)
RBC: 4.4 MIL/uL (ref 3.87–5.11)
RDW: 13 % (ref 11.5–15.5)
WBC: 17.6 10*3/uL — ABNORMAL HIGH (ref 4.0–10.5)
nRBC: 0 % (ref 0.0–0.2)

## 2023-03-05 LAB — BASIC METABOLIC PANEL
Anion gap: 11 (ref 5–15)
BUN: 14 mg/dL (ref 6–20)
CO2: 19 mmol/L — ABNORMAL LOW (ref 22–32)
Calcium: 9.2 mg/dL (ref 8.9–10.3)
Chloride: 106 mmol/L (ref 98–111)
Creatinine, Ser: 0.64 mg/dL (ref 0.44–1.00)
GFR, Estimated: >60 mL/min (ref >60)
Glucose, Bld: 109 mg/dL — ABNORMAL HIGH (ref 70–99)
Potassium: 4.1 mmol/L (ref 3.5–5.1)
Sodium: 136 mmol/L (ref 135–145)

## 2023-03-05 LAB — HCG, SERUM, QUALITATIVE: Preg, Serum: NEGATIVE

## 2023-03-05 MED ORDER — SODIUM CHLORIDE 0.9 % IV SOLN
1.0000 g | Freq: Once | INTRAVENOUS | Status: AC
  Administered 2023-03-05: 1 g via INTRAVENOUS
  Filled 2023-03-05: qty 10

## 2023-03-05 MED ORDER — PHENAZOPYRIDINE HCL 200 MG PO TABS: 200.0000 mg | ORAL_TABLET | Freq: Three times a day (TID) | ORAL | 0 refills | Status: DC

## 2023-03-05 MED ORDER — ONDANSETRON 4 MG PO TBDP
4.0000 mg | ORAL_TABLET | Freq: Three times a day (TID) | ORAL | 0 refills | Status: DC | PRN
Start: 2023-03-05 — End: 2023-10-01

## 2023-03-05 MED ORDER — HYDROCODONE-ACETAMINOPHEN 5-325 MG PO TABS
1.0000 | ORAL_TABLET | Freq: Once | ORAL | Status: AC
  Administered 2023-03-05: 1 via ORAL
  Filled 2023-03-05: qty 1

## 2023-03-05 MED ORDER — PHENAZOPYRIDINE HCL 100 MG PO TABS
200.0000 mg | ORAL_TABLET | Freq: Once | ORAL | Status: AC
  Administered 2023-03-05: 200 mg via ORAL
  Filled 2023-03-05: qty 2

## 2023-03-05 MED ORDER — CEPHALEXIN 500 MG PO CAPS
500.0000 mg | ORAL_CAPSULE | Freq: Three times a day (TID) | ORAL | 0 refills | Status: AC
Start: 2023-03-05 — End: 2023-03-12

## 2023-03-05 MED ORDER — IOHEXOL 350 MG/ML SOLN
90.0000 mL | Freq: Once | INTRAVENOUS | Status: AC | PRN
  Administered 2023-03-05: 90 mL via INTRAVENOUS

## 2023-03-05 MED ORDER — ONDANSETRON 4 MG PO TBDP
8.0000 mg | ORAL_TABLET | Freq: Once | ORAL | Status: AC
  Administered 2023-03-05: 8 mg via ORAL
  Filled 2023-03-05: qty 2

## 2023-03-05 NOTE — Discharge Instructions (Signed)
Your workup today showed UTI.  No concerning finding on the CT scan.  I have sent antibiotic, nausea medication, and Pyridium into the pharmacy for you.  Follow-up with your PCP. Return for any concerning symptoms.

## 2023-03-05 NOTE — ED Provider Notes (Signed)
[facility] EMERGENCY DEPARTMENT AT [facility] Provider Note   CSN: [id number] Arrival date & time: 03/05/23  1191     History  Chief Complaint  Patient presents with   Back Pain    Martha Potts is a 32 y.o. female.  32 year old female presents today for concern of abdominal pain, urinary frequency and dysuria with associated flank pain.  Symptoms started a few days ago.  She has an appointment scheduled with her PCP for 145 today but due to severity of pain and not been able to sleep last night she decided to come into the emergency room instead.  She has taken AZO over-the-counter otherwise has not taken anything prior to arrival.  She has history of UTIs but states in the past she has not had pyelonephritis or kidney stones.  The history is provided by the patient. No language interpreter was used.       Home Medications Prior to Admission medications   Medication Sig Start Date End Date Taking? Authorizing Provider  lurasidone (LATUDA) 40 MG TABS tablet Take 40 mg by mouth daily with breakfast.    [provider]  venlafaxine XR (EFFEXOR-XR) 37.5 MG 24 hr capsule Take one (1) capsule by mouth daily for 5 days, then take two (2) capsules daily 07/30/18   [provider]      Allergies    Patient has no known allergies.    Review of Systems   Review of Systems  Constitutional:  Negative for chills and fever.  Gastrointestinal:  Positive for abdominal pain, nausea and vomiting.  Genitourinary:  Positive for dysuria and flank pain. Negative for hematuria.  All other systems reviewed and are negative.   Physical Exam Updated Vital Signs BP (!) 116/95 (BP Location: Left Arm)   Pulse 95   Temp (!) 97.4 F (36.3 C) (Oral)   Resp 20   Ht 5\' 5"  (1.651 m)   Wt (!) 145.3 kg   SpO2 98%   BMI 53.31 kg/m  Physical Exam Vitals and nursing note reviewed.  Constitutional:      General: She is not in acute distress.    Appearance: Normal  appearance. She is not ill-appearing.  HENT:     Head: Normocephalic and atraumatic.     Nose: Nose normal.  Eyes:     General: No scleral icterus.    Extraocular Movements: Extraocular movements intact.     Conjunctiva/sclera: Conjunctivae normal.  Cardiovascular:     Rate and Rhythm: Normal rate and regular rhythm.     Heart sounds: Normal heart sounds.  Pulmonary:     Effort: Pulmonary effort is normal. No respiratory distress.     Breath sounds: Normal breath sounds. No wheezing or rales.  Abdominal:     General: There is no distension.     Tenderness: There is no abdominal tenderness. There is right CVA tenderness. There is no left CVA tenderness or guarding.  Musculoskeletal:        General: Normal range of motion.     Cervical back: Normal range of motion.  Skin:    General: Skin is warm and dry.  Neurological:     General: No focal deficit present.     Mental Status: She is alert. Mental status is at baseline.     ED Results / Procedures / Treatments   Labs (all labs ordered are listed, but only abnormal results are displayed) Labs Reviewed  URINALYSIS, ROUTINE W REFLEX MICROSCOPIC  HCG, SERUM, QUALITATIVE  BASIC METABOLIC PANEL  CBC  HEPATIC FUNCTION PANEL    EKG None  Radiology No results found.  Procedures Procedures    Medications Ordered in ED Medications  phenazopyridine (PYRIDIUM) tablet 200 mg (has no administration in time range)  ondansetron (ZOFRAN-ODT) disintegrating tablet 8 mg (has no administration in time range)  HYDROcodone-acetaminophen (NORCO/VICODIN) 5-325 MG per tablet 1 tablet (has no administration in time range)    ED Course/ Medical Decision Making/ A&P                                 Medical Decision Making Amount and/or Complexity of Data Reviewed Labs: ordered. Radiology: ordered.  Risk Prescription drug management.   Medical Decision Making / ED Course   This patient presents to the ED for concern of urinary  frequency, abdominal pain, this involves an extensive number of treatment options, and is a complaint that carries with it a high risk of complications and morbidity.  The differential diagnosis includes UTI, pyelonephritis, nephrolithiasis, diverticulitis, colitis, gastroenteritis, appendicitis  MDM: 32 year old female with past medical history as above presents today for concern of abdominal pain.  She has history of cholecystectomy.  Does appear uncomfortable.  Will obtain labs, provide symptom control, and obtain CT imaging.  CBC shows leukocytosis of 17.6.  BMP with preserved renal function and normal electrolytes.  Pregnancy test negative.  UA with evidence of UTI.  CT pending.  Significantly improved from pain standpoint.  CT without acute concern.  Discharged with Keflex, peritoneum, Zofran.   Lab Tests: -I ordered, reviewed, and interpreted labs.   The pertinent results include:   Labs Reviewed  URINALYSIS, ROUTINE W REFLEX MICROSCOPIC - Abnormal; Notable for the following components:      Result Value   Color, Urine AMBER (*)    APPearance HAZY (*)    Hgb urine dipstick MODERATE (*)    Protein, ur 100 (*)    Nitrite POSITIVE (*)    Leukocytes,Ua MODERATE (*)    Bacteria, UA RARE (*)    All other components within normal limits  BASIC METABOLIC PANEL - Abnormal; Notable for the following components:   CO2 19 (*)    Glucose, Bld 109 (*)    All other components within normal limits  CBC - Abnormal; Notable for the following components:   WBC 17.6 (*)    Platelets 461 (*)    All other components within normal limits  HCG, SERUM, QUALITATIVE  HEPATIC FUNCTION PANEL      EKG  EKG Interpretation Date/Time:    Ventricular Rate:    PR Interval:    QRS Duration:    QT Interval:    QTC Calculation:   R Axis:      Text Interpretation:           Imaging Studies ordered: I ordered imaging studies including CT abdomen pelvis with contrast I independently visualized  and interpreted imaging. I agree with the radiologist interpretation   Medicines ordered and prescription drug management: Meds ordered this encounter  Medications   phenazopyridine (PYRIDIUM) tablet 200 mg   ondansetron (ZOFRAN-ODT) disintegrating tablet 8 mg   HYDROcodone-acetaminophen (NORCO/VICODIN) 5-325 MG per tablet 1 tablet   cefTRIAXone (ROCEPHIN) 1 g in sodium chloride 0.9 % 100 mL IVPB    Order Specific Question:   Antibiotic Indication:    Answer:   UTI   iohexol (OMNIPAQUE) 350 MG/ML injection 90 mL    -I  have reviewed the patients home medicines and have made adjustments as needed   Reevaluation: After the interventions noted above, I reevaluated the patient and found that they have :improved  Co morbidities that complicate the patient evaluation History reviewed. No pertinent past medical history.    Dispostion: Discharged in stable condition.  No indication for admission.  Final Clinical Impression(s) / ED Diagnoses Final diagnoses:  Acute cystitis with hematuria    Rx / DC Orders ED Discharge Orders          Ordered    cephALEXin (KEFLEX) 500 MG capsule  3 times daily        03/05/23 1442    phenazopyridine (PYRIDIUM) 200 MG tablet  3 times daily        03/05/23 1442    ondansetron (ZOFRAN-ODT) 4 MG disintegrating tablet  Every 8 hours PRN        03/05/23 1442              Marita [place], PA-C 03/05/23 1447    Rolan Bucco, MD 03/05/23 1504

## 2023-03-05 NOTE — ED Triage Notes (Signed)
Pt c/o right lower back pain, dysuriax3d. N/V started today.

## 2023-03-08 ENCOUNTER — Ambulatory Visit
Admission: EM | Admit: 2023-03-08 | Discharge: 2023-03-08 | Disposition: A | Discharge status: Home / Self Care | Payer: 59 | Attending: Internal Medicine | Admitting: Internal Medicine

## 2023-03-08 DIAGNOSIS — N898 Other specified noninflammatory disorders of vagina: Secondary | ICD-10-CM | POA: Insufficient documentation

## 2023-03-08 DIAGNOSIS — Z20818 Contact with and (suspected) exposure to other bacterial communicable diseases: Secondary | ICD-10-CM | POA: Insufficient documentation

## 2023-03-08 DIAGNOSIS — Z6841 Body Mass Index (BMI) 40.0 and over, adult: Secondary | ICD-10-CM | POA: Insufficient documentation

## 2023-03-08 DIAGNOSIS — J020 Streptococcal pharyngitis: Secondary | ICD-10-CM | POA: Diagnosis present

## 2023-03-08 DIAGNOSIS — Z113 Encounter for screening for infections with a predominantly sexual mode of transmission: Secondary | ICD-10-CM | POA: Diagnosis present

## 2023-03-08 LAB — POCT RAPID STREP A (OFFICE): Rapid Strep A Screen: POSITIVE — AB

## 2023-03-08 MED ORDER — FLUCONAZOLE 150 MG PO TABS: 150.0000 mg | ORAL_TABLET | Freq: Once | ORAL | 0 refills | Status: AC

## 2023-03-08 MED ORDER — CEPHALEXIN 500 MG PO CAPS: 500.0000 mg | ORAL_CAPSULE | Freq: Three times a day (TID) | ORAL | 0 refills | Status: AC

## 2023-03-08 NOTE — ED Triage Notes (Signed)
"  On Saturday night I had noticed symptoms of a UTI, on Monday I was in severe pain when going to the bathroom but also started with sore throat, I went to ED, they did say I have a UTI and placed on antibiotics (Cephalexin 500 mg three times a day for 7 days)". On Wednesday "afternoon started with symptoms of Yeast infection, nausea, sore throat remains, stomach upset". "I need treated for Yeast infection and discuss sore throat".

## 2023-03-08 NOTE — ED Provider Notes (Signed)
[facility]    CSN: [id number] Arrival date & time: 03/08/23  0805      History   Chief Complaint Chief Complaint  Patient presents with   Sore Throat    Family of 2   Vaginitis    HPI Martha Potts is a 32 y.o. female.   Patient presents with several different chief complaints today.  Patient reports that she went to the emergency department on 03/05/2023 and was treated for UTI with cephalexin antibiotic which she is still currently taking.  Reports that a few days into the antibiotic, she developed vaginal itching and a white vaginal discharge so she is concerned for a vaginal yeast infection.  Reports urinary symptoms have now improved and are starting to resolve.  Last menstrual cycle was at the end of October and patient had negative pregnancy test at previous ER visit.  Denies concern for STD.  She is also complaining of approximately 4 to 5-day history of sore throat, nausea, vomiting.  she has been able to tolerate fluids.  She was prescribed Zofran at previous ED visit.  Her son has similar symptoms.  Patient is not reporting any chest pain or shortness of breath.  Denies any upper respiratory symptoms, cough, fever.   Sore Throat    History reviewed. No pertinent past medical history.  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 Maximino Greenland   COLONOSCOPY WITH PROPOFOL N/A 01/21/2023   Procedure: COLONOSCOPY WITH PROPOFOL;  Surgeon: Toney Reil, MD;  Location: [facility] ENDOSCOPY;  Service: Gastroenterology;  Laterality: N/A;    OB History   No obstetric history on file.      Home Medications    Prior to Admission medications   Medication Sig Start Date End Date Taking? Authorizing Provider  cephALEXin (KEFLEX) 500 MG capsule Take 1 capsule (500 mg total) by mouth 3 (three) times daily for 7 days. 03/05/23 03/12/23 Yes Ali, Amjad, PA-C  cephALEXin (KEFLEX) 500 MG capsule Take 1 capsule (500 mg total) by mouth 3 (three) times daily for 3 days. 03/08/23 03/11/23 Yes Heitor Steinhoff, Acie Fredrickson, FNP  ESTARYLLA 0.25-35 MG-MCG tablet Take 1 tablet by mouth daily.   Yes [provider]  fluconazole (DIFLUCAN) 150 MG tablet Take 1 tablet (150 mg total) by mouth once for 1 dose. 03/08/23 03/08/23 Yes Filip Luten, Acie Fredrickson, FNP  lurasidone (LATUDA) 40 MG TABS tablet Take 20 mg by mouth daily with breakfast.   Yes [provider]  venlafaxine XR (EFFEXOR-XR) 37.5 MG 24 hr capsule Take one (1) capsule by mouth daily for 5 days, then take two (2) capsules daily 07/30/18  Yes [provider]  ondansetron (ZOFRAN-ODT) 4 MG disintegrating tablet Take 1 tablet (4 mg total) by mouth every 8 (eight) hours as needed. 03/05/23   Marita Kansas, PA-C  phenazopyridine (PYRIDIUM) 200 MG tablet Take 1 tablet (200 mg total) by mouth 3 (three) times daily. 03/05/23   Marita Kansas, PA-C    Family History Family History  Problem Relation Age of Onset   Hypertension Father    Hodgkin's lymphoma Maternal Grandmother    Lung cancer Paternal Grandfather    Cervical cancer Maternal Aunt     Social History Social History   Tobacco Use   Smoking  status: Never   Smokeless tobacco: Never  Vaping Use   Vaping status: Never Used  Substance Use Topics   Alcohol use: No   Drug use: No     Allergies   Patient has no known allergies.   Review of Systems Review of Systems Per HPI  Physical Exam Triage Vital Signs ED Triage Vitals  Encounter Vitals Group     BP 03/08/23 0836 112/76     Systolic BP Percentile --      Diastolic BP Percentile --      Pulse Rate 03/08/23 0836 88     Resp 03/08/23 0836 18     Temp 03/08/23 0836 97.9 F (36.6 C)     Temp Source 03/08/23 0836 Oral     SpO2 03/08/23 0836 99 %     Weight 03/08/23 0834 (!) 325 lb (147.4 kg)     Height 03/08/23 0834 5\' 5"  (1.651 m)     Head Circumference --      Peak Flow --      Pain Score 03/08/23 0833 3     Pain Loc --      Pain Education --      Exclude from Growth Chart --    No data found.  Updated Vital Signs BP 112/76 (BP Location: Right Arm)   Pulse 88   Temp 97.9 F (36.6 C) (Oral)   Resp 18   Ht 5\' 5"  (1.651 m)   Wt (!) 325 lb (147.4 kg)   LMP 02/18/2023 (Approximate)   SpO2 99%   BMI 54.08 kg/m   Visual Acuity Right Eye Distance:   Left Eye Distance:   Bilateral Distance:    Right Eye Near:   Left Eye Near:    Bilateral Near:     Physical Exam Constitutional:      General: She is not in acute distress.    Appearance: Normal appearance. She is not toxic-appearing or diaphoretic.  HENT:     Head: Normocephalic and atraumatic.     Right Ear: Tympanic membrane and ear canal normal.     Left Ear: Tympanic membrane and ear canal normal.     Nose: No congestion.     Mouth/Throat:     Mouth: Mucous membranes are moist.     Pharynx: Posterior oropharyngeal erythema present.  Eyes:     Extraocular Movements: Extraocular movements intact.     Conjunctiva/sclera: Conjunctivae normal.     Pupils: Pupils are equal, round, and reactive to light.  Cardiovascular:     Rate and Rhythm: Normal rate and regular rhythm.     Pulses: Normal  pulses.     Heart sounds: Normal heart sounds.  Pulmonary:     Effort: Pulmonary effort is normal. No respiratory distress.     Breath sounds: Normal breath sounds. No stridor. No wheezing, rhonchi or rales.  Abdominal:  General: Abdomen is flat. Bowel sounds are normal. There is no distension.     Palpations: Abdomen is soft.     Tenderness: There is no abdominal tenderness.  Genitourinary:    Comments: Deferred with shared decision making.  Self swab performed. Musculoskeletal:        General: Normal range of motion.     Cervical back: Normal range of motion.  Skin:    General: Skin is warm and dry.  Neurological:     General: No focal deficit present.     Mental Status: She is alert and oriented to person, place, and time. Mental status is at baseline.  Psychiatric:        Mood and Affect: Mood normal.        Behavior: Behavior normal.      UC Treatments / Results  Labs (all labs ordered are listed, but only abnormal results are displayed) Labs Reviewed  POCT RAPID STREP A (OFFICE) - Abnormal; Notable for the following components:      Result Value   Rapid Strep A Screen Positive (*)    All other components within normal limits  CERVICOVAGINAL ANCILLARY ONLY    EKG   Radiology No results found.  Procedures Procedures (including critical care time)  Medications Ordered in UC Medications - No data to display  Initial Impression / Assessment and Plan / UC Course  I have reviewed the triage vital signs and the nursing notes.  Pertinent labs & imaging results that were available during my care of the patient were reviewed by me and considered in my medical decision making (see chart for details).     1.  Vaginal discharge  I am concerned the patient has an antibiotic induced vaginal yeast infection so will treat with Diflucan today.  Vaginal swab is pending and patient wishes to have STD testing on this vaginal swab.  Will await results for any additional  treatment.  Patient reports urinary symptoms have improved with cephalexin so encouraged her to continue this antibiotic and complete it.  2.  Strep pharyngitis  Rapid strep was positive.  Patient advised to continue cephalexin and will extend an additional 3 days to a 10-day treatment.  No signs of peritonsillar abscess on exam.  Advised adequate fluids, rest, supportive care, symptom management.  Advised strict follow-up precautions.  Patient verbalized understanding and was agreeable with plan. Final Clinical Impressions(s) / UC Diagnoses   Final diagnoses:  Vaginal discharge  Vaginal itching  Screening examination for venereal disease  Strep throat exposure  Strep pharyngitis     Discharge Instructions      You have strep throat so I have sent an additional 3 days of your antibiotic.  I have also sent Diflucan given concern for yeast infection.  Follow-up if any symptoms persist or worsen.    ED Prescriptions     Medication Sig Dispense Auth. Provider   cephALEXin (KEFLEX) 500 MG capsule Take 1 capsule (500 mg total) by mouth 3 (three) times daily for 3 days. 9 capsule [place], Rif[place] E, [place]   fluconazole (DIFLUCAN) 150 MG tablet Take 1 tablet (150 mg total) by mouth once for 1 dose. 1 tablet [place], Acie Fredrickson, [place]      PDMP not reviewed this encounter.   Gustavus Bryant, [place] 03/08/23 [phone]

## 2023-03-08 NOTE — Discharge Instructions (Signed)
You have strep throat so I have sent an additional 3 days of your antibiotic.  I have also sent Diflucan given concern for yeast infection.  Follow-up if any symptoms persist or worsen.

## 2023-03-11 LAB — CERVICOVAGINAL ANCILLARY ONLY
Bacterial Vaginitis (gardnerella): NEGATIVE
Candida Glabrata: NEGATIVE
Candida Vaginitis: POSITIVE — AB
Chlamydia: NEGATIVE
Comment: NEGATIVE
Comment: NEGATIVE
Comment: NEGATIVE
Comment: NEGATIVE
Comment: NEGATIVE
Comment: NORMAL
Neisseria Gonorrhea: NEGATIVE
Trichomonas: NEGATIVE

## 2023-09-29 DIAGNOSIS — I808 Phlebitis and thrombophlebitis of other sites: Secondary | ICD-10-CM

## 2023-09-29 HISTORY — DX: Phlebitis and thrombophlebitis of other sites: I80.8

## 2023-09-30 ENCOUNTER — Emergency Department (HOSPITAL_COMMUNITY)
Admission: EM | Admit: 2023-09-30 | Discharge: 2023-09-30 | Disposition: A | Discharge status: Home / Self Care | Attending: Emergency Medicine | Admitting: Emergency Medicine

## 2023-09-30 ENCOUNTER — Emergency Department (EMERGENCY_DEPARTMENT_HOSPITAL)

## 2023-09-30 ENCOUNTER — Encounter (HOSPITAL_COMMUNITY): Payer: Self-pay

## 2023-09-30 ENCOUNTER — Other Ambulatory Visit: Payer: Self-pay

## 2023-09-30 ENCOUNTER — Emergency Department (HOSPITAL_COMMUNITY)

## 2023-09-30 DIAGNOSIS — M79602 Pain in left arm: Secondary | ICD-10-CM

## 2023-09-30 DIAGNOSIS — Z3A01 Less than 8 weeks gestation of pregnancy: Secondary | ICD-10-CM | POA: Diagnosis not present

## 2023-09-30 DIAGNOSIS — D649 Anemia, unspecified: Secondary | ICD-10-CM | POA: Insufficient documentation

## 2023-09-30 DIAGNOSIS — O99891 Other specified diseases and conditions complicating pregnancy: Secondary | ICD-10-CM | POA: Diagnosis present

## 2023-09-30 DIAGNOSIS — D72829 Elevated white blood cell count, unspecified: Secondary | ICD-10-CM | POA: Insufficient documentation

## 2023-09-30 DIAGNOSIS — O2231 Deep phlebothrombosis in pregnancy, first trimester: Secondary | ICD-10-CM | POA: Diagnosis not present

## 2023-09-30 LAB — BASIC METABOLIC PANEL WITH GFR
Anion gap: 11 (ref 5–15)
BUN: 11 mg/dL (ref 6–20)
CO2: 20 mmol/L — ABNORMAL LOW (ref 22–32)
Calcium: 9.2 mg/dL (ref 8.9–10.3)
Chloride: 105 mmol/L (ref 98–111)
Creatinine, Ser: 0.64 mg/dL (ref 0.44–1.00)
GFR, Estimated: >60 mL/min (ref >60)
Glucose, Bld: 105 mg/dL — ABNORMAL HIGH (ref 70–99)
Potassium: 3.6 mmol/L (ref 3.5–5.1)
Sodium: 136 mmol/L (ref 135–145)

## 2023-09-30 LAB — CBC WITH DIFFERENTIAL/PLATELET
Abs Immature Granulocytes: 0.04 10*3/uL (ref 0.00–0.07)
Basophils Absolute: 0 10*3/uL (ref 0.0–0.1)
Basophils Relative: 0 %
Eosinophils Absolute: 0 10*3/uL (ref 0.0–0.5)
Eosinophils Relative: 0 %
HCT: 36 % (ref 36.0–46.0)
Hemoglobin: 11.9 g/dL — ABNORMAL LOW (ref 12.0–15.0)
Immature Granulocytes: 0 %
Lymphocytes Relative: 28 %
Lymphs Abs: 3 10*3/uL (ref 0.7–4.0)
MCH: 29.4 pg (ref 26.0–34.0)
MCHC: 33.1 g/dL (ref 30.0–36.0)
MCV: 88.9 fL (ref 80.0–100.0)
Monocytes Absolute: 0.6 10*3/uL (ref 0.1–1.0)
Monocytes Relative: 5 %
Neutro Abs: 7.2 10*3/uL (ref 1.7–7.7)
Neutrophils Relative %: 67 %
Platelets: 354 10*3/uL (ref 150–400)
RBC: 4.05 MIL/uL (ref 3.87–5.11)
RDW: 14.3 % (ref 11.5–15.5)
WBC: 10.9 10*3/uL — ABNORMAL HIGH (ref 4.0–10.5)
nRBC: 0 % (ref 0.0–0.2)

## 2023-09-30 LAB — PROTIME-INR
INR: 1 (ref 0.8–1.2)
Prothrombin Time: 13.3 s (ref 11.4–15.2)

## 2023-09-30 LAB — HCG, QUANTITATIVE, PREGNANCY: hCG, Beta Chain, Quant, S: 17959 m[IU]/mL — ABNORMAL HIGH (ref <5)

## 2023-09-30 MED ORDER — ACETAMINOPHEN 500 MG PO TABS
500.0000 mg | ORAL_TABLET | Freq: Once | ORAL | Status: AC
  Administered 2023-09-30: 500 mg via ORAL
  Filled 2023-09-30: qty 1

## 2023-09-30 MED ORDER — ACETAMINOPHEN 325 MG PO TABS
650.0000 mg | ORAL_TABLET | Freq: Once | ORAL | Status: AC
  Administered 2023-09-30: 650 mg via ORAL
  Filled 2023-09-30: qty 2

## 2023-09-30 MED ORDER — ENOXAPARIN SODIUM 150 MG/ML IJ SOSY: 150.0000 mg | PREFILLED_SYRINGE | Freq: Two times a day (BID) | INTRAMUSCULAR | 0 refills | Status: DC

## 2023-09-30 MED ORDER — ENOXAPARIN SODIUM 150 MG/ML IJ SOSY
150.0000 mg | PREFILLED_SYRINGE | Freq: Two times a day (BID) | INTRAMUSCULAR | Status: DC
  Administered 2023-09-30: 150 mg via SUBCUTANEOUS
  Filled 2023-09-30 (×2): qty 1

## 2023-09-30 NOTE — Consult Note (Addendum)
 OBSTETRICS AND GYNECOLOGY ATTENDING TELEPHONE CONSULT NOTE   Consult Date:09/30/2023   Reason for Consult:  Left upper extremity DVT, early pregnancy and bleeding Consulting Provider: Royann Cords, PA   Consultation Details (from provider and chart review):  Martha Potts is a 33 yo G2P1 around [redacted]w[redacted]d at [facility] who presented with left upper arm pain and was diagnosed with a DVT.  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 Patient reported having some abdominal pain and small amount of bleeding. No other emergent concerns.  The provider had a clinical question about anticoagulation.  I performed a chart review on the patient and reviewed available documentation.  BP (!) 152/93 (BP Location: Right Arm)   Pulse 88   Temp 97.8 F (36.6 C) (Oral)   Resp 18   LMP 08/13/2023   SpO2 99%   Exam performed by consulting provider and remarkable for tender and swelling in left forearm  Pertinent labs and imaging: Results for orders placed or performed during the hospital encounter of 09/30/23 (from the past 24 hours)  hCG, quantitative, pregnancy     Status: Abnormal   Collection Time: 09/30/23 11:42 AM  Result Value Ref Range   hCG, Beta Chain, Quant, S 17,959 (H) <5 mIU/mL   US  OB Comp < 14 Wks Result Date: 09/30/2023 CLINICAL DATA:  abdominal pain, prior US  with uterine horn gestational sac EXAM: OBSTETRIC <14 WK US  AND TRANSVAGINAL OB US  TECHNIQUE: Both transabdominal and transvaginal ultrasound examinations were performed for complete evaluation of the gestation as well as the maternal uterus, adnexal regions, and pelvic cul-de-sac. Transvaginal technique was performed to assess early pregnancy. COMPARISON:  April 17, 2012, March 05, 2023 FINDINGS: Intrauterine gestational sac: Single intrauterine  gestational sac within the left uterine horn. Yolk sac:  Present Embryo:  Present Cardiac Activity: Present Heart Rate: 88 bpm CRL:  2.2 mm   5 w   5 d Subchorionic hemorrhage:  None visualized. Maternal uterus/adnexae: Homogeneously thickened endometrium in the right uterine horn, measuring 17 mm. The right ovary was not visualized. The left ovary not enlarged. No free pelvic fluid. 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 Fetal heart rate of 88 beats per minute. Continued close obstetric and sonographic follow-up is recommended. Electronically Signed   By: Rance Burrows M.D.   On: 09/30/2023 14:22   Vascular ultrasound read (as per provider):  L DVT visualized.   Recommendations: - Patient has first trimester spotting and very early pregnancy.  No severe bleeding at this time.  This is not a contraindication to anticoagulation therapy;  an untreated DVT can be very dangerous.  Recommended Lovenox  therapy for patient, pharmacy can be consulted for guidance with dosing. This can be taken as outpatient, there is no indication for inpatient management at this point. - Patient already has a follow up appointment with [facility] within the week. - Recommended that she come to [facility] for worsening bleeding or other concerns.     Thank you for this telephone consult.  If additional recommendations are needed, please call [phone] [facility] Consult Attending Monday-Friday 8am - 5pm) or [phone] [facility] Attending On Call all day, every day).    I spent approximately  5 minutes directly consulting with the provider and verbally discussing this case. Additional 12 minutes was spent performing chart review and documentation.    Lenoard Rad, MD Obstetrician & Gynecologist, [facility] for [employer], [facility]

## 2023-09-30 NOTE — Progress Notes (Signed)
 PHARMACY - ANTICOAGULATION CONSULT NOTE  Pharmacy Consult for heparin Indication: acute DVT  No Known Allergies  Patient Measurements:    Vital Signs: Temp: 98.4 F (36.9 C) (06/02 1852) Temp Source: Oral (06/02 1852) BP: 150/101 (06/02 1852) Pulse Rate: 87 (06/02 1852)  Labs: Recent Labs    09/30/23 1759  HGB 11.9*  HCT 36.0  PLT 354  LABPROT 13.3  INR 1.0  CREATININE 0.64    CrCl cannot be calculated (Unknown ideal weight.).   Assessment: Patient is a 33 y.o pregnant female who presented to the ED on 09/30/23 with c/o left forearm pain.  UE doppler on 09/30/23 showed acute DVT on the left brachial veins and  acute superficial vein thrombosis involving the left cephalic vein. Pharmacy has been consulted to dose lovenox  for VTE treatment.  Today, 09/30/2023: - scr 0.64 (crcl >100) - cbc ok  Goal of Therapy:  Anti-Xa level 0.6-1 units/ml 4hrs after LMWH dose given Monitor platelets by anticoagulation protocol: Yes   Plan:  - Lovenox  150 mg SQ q12h  - cbc q72h   Ellar Hakala P 09/30/2023,7:10 PM

## 2023-09-30 NOTE — ED Triage Notes (Signed)
 Pt arrives via POV. Pt reports she was seen at [facility] ER last Wednesday. PT reports she had a to have an US  guided iv placed in her left forearm. Pt reports since Saturday, she has had pain and swelling to her left forearm and states the tips of her fingers have been tingling. States no fluids or meds were ever given through the IV.   PT states she was seen at the ED for lower abdominal pain. States she is approximately [redacted] weeks pregnant. Reports she is still having the abdominal pain, denies any bleeding or discharge.

## 2023-09-30 NOTE — Progress Notes (Signed)
 VASCULAR LAB    Left upper extremity venous duplex has been performed.  See CV proc for preliminary results.  Relayed results to Dr. Lewis Red via secure chat  Carleene Chase, RVT 09/30/2023, 4:21 PM

## 2023-09-30 NOTE — ED Provider Notes (Signed)
 Received patient at signout from previous provider pending LE ultrasound, completion of ED workup.  See her note.  In short, patient presents to emergency department for evaluation of left forearm pain and lower abdominal pain.  She reports that she is a difficult stick and had multiple attempts to place an IV in her left forearm.  She then developed pain, sensitivity, erythema along the left forearm.  Of note, also is early pregnancy with a history of a bicornate uterus. No vaginal bleeding initially however noted some light pink vaginal bleeding with wiping when she went to restroom in ED.  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 Fetal heart rate of 88 beats per minute.  ED workup notable for hCG 17 959, mild leukocytosis of 10.9, and mild anemia of 11.9 (baseline 12-12.8 over past seven years)  Consulted GYN Dr. Thurmon [place] with GYN who recommended patient be on outpatient Lovenox .  See her note.  Provided her 1 dose here in emergency department as well as a prescription for 1 month.  Patient has follow-up with GYN next week and will discuss further management of Lovenox  with them.    Consulted pharmacy who recommended 150mg  q 12 hrs for next three months  Patient can use Tylenol  at home for pain as needed as well as heat and cold pads, lidocaine  patches.  Discussed ED workup, disposition, strict return to ED precautions with patient who expresses understanding agrees with plan.  All questions answered to their satisfaction.  They are agreeable to plan.  Discharge instructions provided on paperwork   Royann Cords, PA 09/30/23 2357    Burnette Carte, MD 10/02/23 [phone]

## 2023-09-30 NOTE — ED Provider Notes (Signed)
 [facility] EMERGENCY DEPARTMENT AT [facility] Provider Note   CSN: [id number] Arrival date & time: 09/30/23  1028     History  No chief complaint on file.   Martha Potts is a 33 y.o. female.  33 year old female presents with complaint of pain in her left forearm. Patient was seen in the ER in [place] last week Wednesday for abdominal pain in early pregnancy.  Patient states that she is a difficult IV stick, eventually had an IV placed in her left forearm.  She noticed a couple days later developing sensitivity and pain with redness along her left forearm.  There is no streaking, no lymphadenopathy.  She continues to have lower abdominal discomfort.  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  States that she knows she has a negative blood type.  She has not had any vaginal bleeding.       Home Medications Prior to Admission medications   Medication Sig Start Date End Date Taking? Authorizing Provider  ESTARYLLA 0.25-35 MG-MCG tablet Take 1 tablet by mouth daily.    [provider]  lurasidone (LATUDA) 40 MG TABS tablet Take 20 mg by mouth daily with breakfast.    [provider]  ondansetron  (ZOFRAN -ODT) 4 MG disintegrating tablet Take 1 tablet (4 mg total) by mouth every 8 (eight) hours as needed. 03/05/23   Ceclia Cohens, Amjad, PA-C  phenazopyridine  (PYRIDIUM ) 200 MG tablet Take 1 tablet (200 mg total) by mouth 3 (three) times daily. 03/05/23   Lucina Sabal, PA-C  venlafaxine XR (EFFEXOR-XR) 37.5 MG 24 hr capsule Take one (1) capsule by mouth daily for 5 days, then take two (2) capsules daily 07/30/18   [provider]      Allergies    Patient has no known allergies.    Review of Systems   Review of Systems Negative except as per HPI Physical Exam Updated Vital Signs BP (!) 152/93 (BP Location: Right Arm)   Pulse 88   Temp 97.8 F (36.6 C) (Oral)   Resp 18   LMP 08/13/2023    SpO2 99%  Physical Exam Vitals and nursing note reviewed.  Constitutional:      General: She is not in acute distress.    Appearance: She is well-developed. She is not diaphoretic.  HENT:     Head: Normocephalic and atraumatic.  Cardiovascular:     Pulses: Normal pulses.  Pulmonary:     Effort: Pulmonary effort is normal.  Abdominal:     Palpations: Abdomen is soft.     Tenderness: There is no abdominal tenderness.  Musculoskeletal:        General: Swelling and tenderness present. No deformity.     Comments: Mild redness noted to left forearm which is tender to the touch, no warmth.  No streaking. Compartments soft. Peripheral pulses present. Sensation intact.   Skin:    General: Skin is warm and dry.     Findings: Rash present.  Neurological:     Mental Status: She is alert and oriented to person, place, and time.  Psychiatric:        Behavior: Behavior normal.     ED Results / Procedures / Treatments   Labs (all labs ordered are listed, but only abnormal results are displayed) Labs Reviewed  HCG, QUANTITATIVE, PREGNANCY - Abnormal; Notable for the following components:      Result Value   hCG, Beta Chain, Quant, S 17,959 (*)  All other components within normal limits    EKG None  Radiology US  OB Comp < 14 Wks Result Date: 09/30/2023 CLINICAL DATA:  abdominal pain, prior US  with uterine horn gestational sac EXAM: OBSTETRIC <14 WK US  AND TRANSVAGINAL OB US  TECHNIQUE: Both transabdominal and transvaginal ultrasound examinations were performed for complete evaluation of the gestation as well as the maternal uterus, adnexal regions, and pelvic cul-de-sac. Transvaginal technique was performed to assess early pregnancy. COMPARISON:  April 17, 2012, March 05, 2023 FINDINGS: Intrauterine gestational sac: Single intrauterine gestational sac within the left uterine horn. Yolk sac:  Present Embryo:  Present Cardiac Activity: Present Heart Rate: 88 bpm CRL:  2.2 mm   5 w   5 d  Subchorionic hemorrhage:  None visualized. Maternal uterus/adnexae: Homogeneously thickened endometrium in the right uterine horn, measuring 17 mm. The right ovary was not visualized. The left ovary not enlarged. No free pelvic fluid. 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 Fetal heart rate of 88 beats per minute. Continued close obstetric and sonographic follow-up is recommended. Electronically Signed   By: Rance Burrows M.D.   On: 09/30/2023 14:22    Procedures Procedures    Medications Ordered in ED Medications  acetaminophen  (TYLENOL ) tablet 650 mg (650 mg Oral Given 09/30/23 1514)    ED Course/ Medical Decision Making/ A&P                                 Medical Decision Making Amount and/or Complexity of Data Reviewed Labs: ordered. Radiology: ordered.  Risk OTC drugs.   33 year old female presents with concern for left forearm pain after IV placed last week with ongoing lower abdominal discomfort without vaginal bleeding or discharge.  Labs compared to prior on file, quant is appropriately uptrending.  Plan is to obtain ultrasound of the left forearm, suspect superficial thrombophlebitis but will evaluate for DVT.  Also evaluate for IUP given prior ultrasound with gestational sac in left uterine horn.  OB US  today with IUP with FHT 88, slow but also early at [redacted]w[redacted]d. Recommends OB FU.   Care signed out at change of shift pending doppler for LUE pain after peripheral IV. Suspect superficial phlebitis.          Final Clinical Impression(s) / ED Diagnoses Final diagnoses:  Left arm pain  Less than [redacted] weeks gestation of pregnancy    Rx / DC Orders ED Discharge Orders     None         Erna He 09/30/23 1629    Deatra Face, MD 10/07/23 [phone]

## 2023-09-30 NOTE — Discharge Instructions (Signed)
 Thank you for letting us  evaluate you today.  You have been found to have a blood clot in your left arm.  We have given you a dose of Lovenox  here in emergency department to "break up the clot".  Please follow-up with GYN clinic next week for further management.  Please asked them to continue the Lovenox  dosing as you likely will need to be on this for 3 months at least.  Tylenol  for pain  Return to emergency department if you experience significant vaginal bleeding especially with severe abdominal pain, lightheadedness or dizziness

## 2023-10-01 ENCOUNTER — Encounter (HOSPITAL_COMMUNITY): Payer: Self-pay

## 2023-10-01 ENCOUNTER — Telehealth (HOSPITAL_COMMUNITY): Payer: Self-pay | Admitting: Emergency Medicine

## 2023-10-01 ENCOUNTER — Emergency Department (HOSPITAL_COMMUNITY)
Admission: EM | Admit: 2023-10-01 | Discharge: 2023-10-01 | Disposition: A | Discharge status: Home / Self Care | Attending: Emergency Medicine | Admitting: Emergency Medicine

## 2023-10-01 ENCOUNTER — Other Ambulatory Visit: Payer: Self-pay

## 2023-10-01 DIAGNOSIS — O26891 Other specified pregnancy related conditions, first trimester: Secondary | ICD-10-CM | POA: Diagnosis present

## 2023-10-01 DIAGNOSIS — I82A12 Acute embolism and thrombosis of left axillary vein: Secondary | ICD-10-CM | POA: Diagnosis not present

## 2023-10-01 DIAGNOSIS — O2231 Deep phlebothrombosis in pregnancy, first trimester: Secondary | ICD-10-CM | POA: Diagnosis not present

## 2023-10-01 DIAGNOSIS — Z3A01 Less than 8 weeks gestation of pregnancy: Secondary | ICD-10-CM | POA: Diagnosis not present

## 2023-10-01 DIAGNOSIS — I82622 Acute embolism and thrombosis of deep veins of left upper extremity: Secondary | ICD-10-CM | POA: Insufficient documentation

## 2023-10-01 MED ORDER — OXYCODONE HCL 5 MG PO TABS
5.0000 mg | ORAL_TABLET | ORAL | Status: AC
  Administered 2023-10-01: 5 mg via ORAL
  Filled 2023-10-01: qty 1

## 2023-10-01 MED ORDER — ENOXAPARIN SODIUM 150 MG/ML IJ SOSY
150.0000 mg | PREFILLED_SYRINGE | INTRAMUSCULAR | Status: AC
  Administered 2023-10-01: 150 mg via SUBCUTANEOUS
  Filled 2023-10-01: qty 1

## 2023-10-01 MED ORDER — OXYCODONE HCL 5 MG PO TABS: 5.0000 mg | ORAL_TABLET | ORAL | 0 refills | Status: DC | PRN

## 2023-10-01 MED ORDER — ACETAMINOPHEN 325 MG PO TABS
650.0000 mg | ORAL_TABLET | ORAL | Status: AC
  Administered 2023-10-01: 650 mg via ORAL
  Filled 2023-10-01: qty 2

## 2023-10-01 NOTE — Telephone Encounter (Cosign Needed)
 Contacted Debbe Fail, NP at [facility] for recommendation regarding RhoGAM administration for patient as she experienced some mild vaginal bleeding noted while wiping once and is Rh-.  No additional bleeding reported following nor complaints of dizziness, lightheadedness. As she is under 12 weeks, with no molar pregnancy, with no ectopic pregnancy, she does not recommend RhoGAM administration at this time.  She recommends that patient continue to [facility] at [facility] as scheduled.

## 2023-10-01 NOTE — ED Triage Notes (Signed)
 Pt bib [facility] coming from home. Pt recently diagnosed with DVT/3 blood clots in left arm. Pt was seen at [facility] yesterday, supposed to be admitted but went home, prescribed with Lovenox . Pt reports pain on left arm/radiating to chest that's been ongoing for a week but increased today around 1530. She reports abnormal sensation to left arm. EMS reports radial pulse present. Pt reports SOB/nausea. 12 lead unremarkable. Pt is [redacted]weeks pregnant.

## 2023-10-01 NOTE — ED Provider Notes (Signed)
 [place] EMERGENCY DEPARTMENT AT [facility] Provider Note   CSN: [id number] Arrival date & time: 10/01/23  1909     History {Add pertinent medical, surgical, social history, OB history to HPI:1} No chief complaint on file.   Martha Potts is a 33 y.o. female.  33 year old female at approximately [redacted] weeks pregnant who presents emergency department left forearm pain.  Patient had an ultrasound today which shows that she has a DVT in the left brachial and cephalic veins.  Was prescribed Lovenox  and was sent home last night prior to having the ultrasound for presumed DVT.  She reports that since going home has had severe pain in her family convinced her to come to the emergency department.  Says she is having some tingling in her arm at this time.       Home Medications Prior to Admission medications   Medication Sig Start Date End Date Taking? Authorizing Provider  enoxaparin  (LOVENOX ) 150 MG/ML injection Inject 1 mL (150 mg total) into the skin every 12 (twelve) hours. 09/30/23 10/30/23  Royann Cords, PA  ESTARYLLA 0.25-35 MG-MCG tablet Take 1 tablet by mouth daily.    [provider]  lurasidone (LATUDA) 40 MG TABS tablet Take 20 mg by mouth daily with breakfast.    [provider]  ondansetron  (ZOFRAN -ODT) 4 MG disintegrating tablet Take 1 tablet (4 mg total) by mouth every 8 (eight) hours as needed. 03/05/23   Ceclia Cohens, Amjad, PA-C  phenazopyridine  (PYRIDIUM ) 200 MG tablet Take 1 tablet (200 mg total) by mouth 3 (three) times daily. 03/05/23   Lucina Sabal, PA-C  venlafaxine XR (EFFEXOR-XR) 37.5 MG 24 hr capsule Take one (1) capsule by mouth daily for 5 days, then take two (2) capsules daily 07/30/18   [provider]      Allergies    Patient has no known allergies.    Review of Systems   Review of Systems  Physical Exam Updated Vital Signs BP 121/81 (BP Location: Right Arm)   Pulse 89   Temp 98.4 F (36.9 C) (Oral)   Resp 17   Ht 5\' 5"   (1.651 m)   Wt (!) 154.2 kg   LMP 08/13/2023   SpO2 100%   BMI 56.58 kg/m  Physical Exam Musculoskeletal:     Comments: Swelling noted of the left upper extremity.  All compartments are compressible.  Intact sensation to light touch in the upper extremity.  Radial pulse 2+.  Cap refill <2 seconds in all digits of left hand.     ED Results / Procedures / Treatments   Labs (all labs ordered are listed, but only abnormal results are displayed) Labs Reviewed - No data to display  EKG None  Radiology UE Venous Duplex ([facility] and [facility] ONLY) Result Date: 10/01/2023 UPPER VENOUS STUDY  Patient Name:  Martha Potts  Date of Exam:   09/30/2023 Medical Rec #: [id number]          Accession #:    [id number] Date of Birth: 1990/06/23           Patient Gender: F Patient Age:   33 years Exam Location:  [facility] Procedure:      VAS US  UPPER EXTREMITY VENOUS DUPLEX Referring Phys: Editha Goring --------------------------------------------------------------------------------  Indications: Pain, Swelling, Erythema, and Patient had difficult blood draw and IV placement one week ago. [redacted] weeks pregnant. Limitations: Body habitus and edema, pain with compression. Comparison Study: No prior study on file Performing Technologist: Candace  Lanette Pipe RVS  Examination Guidelines: A complete evaluation includes B-mode imaging, spectral Doppler, color Doppler, and power Doppler as needed of all accessible portions of each vessel. Bilateral testing is considered an integral part of a complete examination. Limited examinations for reoccurring indications may be performed as noted.  Right Findings: +----------+------------+---------+-----------+----------+-------+ RIGHT     CompressiblePhasicitySpontaneousPropertiesSummary +----------+------------+---------+-----------+----------+-------+ Subclavian               Yes       Yes                      +----------+------------+---------+-----------+----------+-------+   Left Findings: +----------+------------+---------+-----------+----------+---------------------+ LEFT      CompressiblePhasicitySpontaneousProperties       Summary        +----------+------------+---------+-----------+----------+---------------------+ IJV           Full       Yes       Yes                                    +----------+------------+---------+-----------+----------+---------------------+ Subclavian               Yes       Yes                                    +----------+------------+---------+-----------+----------+---------------------+ Axillary                 Yes       Yes                                    +----------+------------+---------+-----------+----------+---------------------+ Brachial      None                                          Acute         +----------+------------+---------+-----------+----------+---------------------+ Radial        Full                                                        +----------+------------+---------+-----------+----------+---------------------+ Ulnar         Full                                                        +----------+------------+---------+-----------+----------+---------------------+ Cephalic      None                                  acute proximal to mid  forearm        +----------+------------+---------+-----------+----------+---------------------+ Basilic       Full                                                        +----------+------------+---------+-----------+----------+---------------------+  Summary:  Right: No evidence of thrombosis in the subclavian.  Left: Findings consistent with acute deep vein thrombosis involving the left brachial veins. Findings consistent with acute superficial vein thrombosis involving the left cephalic vein.  *See table(s) above for measurements and observations.   Diagnosing physician: Delaney Fearing Electronically signed by Delaney Fearing on 10/01/2023 at 10:09:32 AM.    Final    US  OB Comp < 14 Wks Result Date: 09/30/2023 CLINICAL DATA:  abdominal pain, prior US  with uterine horn gestational sac EXAM: OBSTETRIC <14 WK US  AND TRANSVAGINAL OB US  TECHNIQUE: Both transabdominal and transvaginal ultrasound examinations were performed for complete evaluation of the gestation as well as the maternal uterus, adnexal regions, and pelvic cul-de-sac. Transvaginal technique was performed to assess early pregnancy. COMPARISON:  April 17, 2012, March 05, 2023 FINDINGS: Intrauterine gestational sac: Single intrauterine gestational sac within the left uterine horn. Yolk sac:  Present Embryo:  Present Cardiac Activity: Present Heart Rate: 88 bpm CRL:  2.2 mm   5 w   5 d Subchorionic hemorrhage:  None visualized. Maternal uterus/adnexae: Homogeneously thickened endometrium in the right uterine horn, measuring 17 mm. The right ovary was not visualized. The left ovary not enlarged. No free pelvic fluid. 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 Fetal heart rate of 88 beats per minute. Continued close obstetric and sonographic follow-up is recommended. Electronically Signed   By: Rance Burrows M.D.   On: 09/30/2023 14:22    Procedures Procedures  {Document cardiac monitor, telemetry assessment procedure when appropriate:1}  Medications Ordered in ED Medications - No data to display  ED Course/ Medical Decision Making/ A&P   {   Click here for ABCD2, HEART and other calculatorsREFRESH Note before signing :1}                              Medical Decision Making Risk OTC drugs. Prescription drug management.   ***  {Document critical care time when appropriate:1} {Document review of labs and clinical decision tools ie heart score, Chads2Vasc2 etc:1}  {Document  your independent review of radiology images, and any outside records:1} {Document your discussion with family members, caretakers, and with consultants:1} {Document social determinants of health affecting pt's care:1} {Document your decision making why or why not admission, treatments were needed:1} Final Clinical Impression(s) / ED Diagnoses Final diagnoses:  None    Rx / DC Orders ED Discharge Orders     None

## 2023-10-01 NOTE — ED Notes (Signed)
 EDP notified of patient arrival.

## 2023-10-01 NOTE — Discharge Instructions (Signed)
 You were seen for the blood clot in your arm in the emergency department.   At home, please take Tylenol  for your pain.  Use warm compresses as well.  Take the Lovenox  you are prescribed. You may also take the oxycodone  we have prescribed you for any breakthrough pain that may have.  Do not take this before driving or operating heavy machinery.   Check your MyChart online for the results of any tests that had not resulted by the time you left the emergency department.   Follow-up with your primary doctor in 2-3 days regarding your visit.  Follow-up with vascular surgery if you are still having arm pain after 1 to 2 weeks.  Return immediately to the emergency department if you experience any of the following: Worsening pain, new numbness or weakness, or any other concerning symptoms.    Thank you for visiting our Emergency Department. It was a pleasure taking care of you today.

## 2023-10-09 ENCOUNTER — Inpatient Hospital Stay (HOSPITAL_COMMUNITY)

## 2023-10-09 ENCOUNTER — Inpatient Hospital Stay (HOSPITAL_COMMUNITY)
Admission: AD | Admit: 2023-10-09 | Discharge: 2023-10-09 | Disposition: A | Discharge status: Home / Self Care | Attending: Obstetrics and Gynecology | Admitting: Obstetrics and Gynecology

## 2023-10-09 ENCOUNTER — Encounter (HOSPITAL_COMMUNITY): Payer: Self-pay | Admitting: *Deleted

## 2023-10-09 ENCOUNTER — Other Ambulatory Visit: Payer: Self-pay

## 2023-10-09 DIAGNOSIS — O26891 Other specified pregnancy related conditions, first trimester: Secondary | ICD-10-CM

## 2023-10-09 DIAGNOSIS — Z86718 Personal history of other venous thrombosis and embolism: Secondary | ICD-10-CM | POA: Insufficient documentation

## 2023-10-09 DIAGNOSIS — O26611 Liver and biliary tract disorders in pregnancy, first trimester: Secondary | ICD-10-CM | POA: Insufficient documentation

## 2023-10-09 DIAGNOSIS — K760 Fatty (change of) liver, not elsewhere classified: Secondary | ICD-10-CM | POA: Insufficient documentation

## 2023-10-09 DIAGNOSIS — Z3A01 Less than 8 weeks gestation of pregnancy: Secondary | ICD-10-CM | POA: Diagnosis not present

## 2023-10-09 DIAGNOSIS — I82722 Chronic embolism and thrombosis of deep veins of left upper extremity: Secondary | ICD-10-CM

## 2023-10-09 DIAGNOSIS — R0789 Other chest pain: Secondary | ICD-10-CM

## 2023-10-09 HISTORY — DX: Other and unspecified doubling of uterus: Q51.28

## 2023-10-09 LAB — COMPREHENSIVE METABOLIC PANEL WITH GFR
ALT: 23 U/L (ref 0–44)
AST: 22 U/L (ref 15–41)
Albumin: 3.4 g/dL — ABNORMAL LOW (ref 3.5–5.0)
Alkaline Phosphatase: 98 U/L (ref 38–126)
Anion gap: 10 (ref 5–15)
BUN: 7 mg/dL (ref 6–20)
CO2: 21 mmol/L — ABNORMAL LOW (ref 22–32)
Calcium: 8.8 mg/dL — ABNORMAL LOW (ref 8.9–10.3)
Chloride: 105 mmol/L (ref 98–111)
Creatinine, Ser: 0.46 mg/dL (ref 0.44–1.00)
GFR, Estimated: >60 mL/min (ref >60)
Glucose, Bld: 91 mg/dL (ref 70–99)
Potassium: 3.8 mmol/L (ref 3.5–5.1)
Sodium: 136 mmol/L (ref 135–145)
Total Bilirubin: 0.7 mg/dL (ref 0.0–1.2)
Total Protein: 7.1 g/dL (ref 6.5–8.1)

## 2023-10-09 LAB — CBC WITH DIFFERENTIAL/PLATELET
Abs Immature Granulocytes: 0.07 10*3/uL (ref 0.00–0.07)
Basophils Absolute: 0.1 10*3/uL (ref 0.0–0.1)
Basophils Relative: 1 %
Eosinophils Absolute: 0 10*3/uL (ref 0.0–0.5)
Eosinophils Relative: 0 %
HCT: 34.8 % — ABNORMAL LOW (ref 36.0–46.0)
Hemoglobin: 11.8 g/dL — ABNORMAL LOW (ref 12.0–15.0)
Immature Granulocytes: 1 %
Lymphocytes Relative: 28 %
Lymphs Abs: 3.4 10*3/uL (ref 0.7–4.0)
MCH: 29.1 pg (ref 26.0–34.0)
MCHC: 33.9 g/dL (ref 30.0–36.0)
MCV: 85.7 fL (ref 80.0–100.0)
Monocytes Absolute: 0.7 10*3/uL (ref 0.1–1.0)
Monocytes Relative: 6 %
Neutro Abs: 7.9 10*3/uL — ABNORMAL HIGH (ref 1.7–7.7)
Neutrophils Relative %: 64 %
Platelets: 414 10*3/uL — ABNORMAL HIGH (ref 150–400)
RBC: 4.06 MIL/uL (ref 3.87–5.11)
RDW: 13.7 % (ref 11.5–15.5)
WBC: 12.1 10*3/uL — ABNORMAL HIGH (ref 4.0–10.5)
nRBC: 0 % (ref 0.0–0.2)

## 2023-10-09 LAB — HEPARIN ANTI-XA: Heparin LMW: 0.44 [IU]/mL

## 2023-10-09 LAB — TROPONIN I (HIGH SENSITIVITY): Troponin I (High Sensitivity): <2 ng/L (ref <18)

## 2023-10-09 LAB — TYPE AND SCREEN
ABO/RH(D): A NEG
Antibody Screen: NEGATIVE

## 2023-10-09 LAB — PROTIME-INR
INR: 1 (ref 0.8–1.2)
Prothrombin Time: 13.7 s (ref 11.4–15.2)

## 2023-10-09 LAB — APTT: aPTT: 28 s (ref 24–36)

## 2023-10-09 LAB — D-DIMER, QUANTITATIVE: D-Dimer, Quant: <0.27 ug{FEU}/mL (ref 0.00–0.50)

## 2023-10-09 MED ORDER — CYCLOBENZAPRINE HCL 5 MG PO TABS: 5.0000 mg | ORAL_TABLET | Freq: Three times a day (TID) | ORAL | 0 refills | Status: DC | PRN

## 2023-10-09 MED ORDER — IOHEXOL 350 MG/ML SOLN
75.0000 mL | Freq: Once | INTRAVENOUS | Status: AC | PRN
Start: 2023-10-09 — End: 2023-10-09
  Administered 2023-10-09: 75 mL via INTRAVENOUS

## 2023-10-09 MED ORDER — ENOXAPARIN SODIUM 150 MG/ML IJ SOSY
150.0000 mg | PREFILLED_SYRINGE | Freq: Once | INTRAMUSCULAR | Status: AC
  Administered 2023-10-09: 150 mg via SUBCUTANEOUS
  Filled 2023-10-09: qty 1

## 2023-10-09 NOTE — MAU Note (Signed)
 RN called CT tech to inquire about ETA, Camilo Cella informed RN that she would put in for patient transport now.

## 2023-10-09 NOTE — MAU Note (Addendum)
 Martha Potts is a 33 y.o. at [redacted]w[redacted]d here in MAU reporting: has three blood clots in her left arm that she is on blood thinners for. Today having some right sided chest pain and shortness of breath and her OB wanted her to be checked to ensure the clots haven't moved.   Onset of complaint: yesterday morning Pain score: 4/10 Vitals:   10/09/23 1718  BP: 120/76  Pulse: 82  Resp: 20  Temp: 98.2 F (36.8 C)  SpO2: 100%

## 2023-10-09 NOTE — MAU Note (Signed)
 Pt transport at bedside to take patient to CT. RN verified consents and sent with patient.

## 2023-10-09 NOTE — MAU Provider Note (Addendum)
 VB, Blood Clots, CP    S Martha Potts is a 33 y.o. G2P1000 pregnant female at [redacted]w[redacted]d who presents to MAU today with complaint of new chest pain and SOB in s/o known LUE DVTs. Pt states she had LUE DVTs dx'ed after traumatic pIV placements at [facility] in which she discovered when she presented to [facility] ED on 6/2 and started on Lovenox . US  then showed SIUP in left horn of bicornuate uterus with FHR of 88bpm.  She had originally presented to [facility] out of fear for miscarriage.  [facility] dx'ed DVTs.  She reports since starting [facility] she has noticed intermittent VB and cramping that has been unchanged and being followed with her OP OB Providers. However, today she states she was unusually SOB this morning that didn't resolve when she was at work.  It was at work that she developed R sided posterior breast pain as well that is worse with deep inspiration. She also states she got diaphoretic and lightheaded that relieved with break and water intact. She called [facility] and they asked for her to come in to be evaluated for ?PE.  Pt NAD upon triage and continues to complain on spotting and cramping irritation, unchanged from prior exams.  Pt reports compliance with Lovenox  BID.    Receives care at [facility]. Prenatal records reviewed.  Pertinent items noted in HPI and remainder of comprehensive ROS otherwise negative.   O BP 120/76 (BP Location: Right Arm)   Pulse 82   Temp 98.2 F (36.8 C) (Oral)   Resp 20   Ht 5' 5 (1.651 m)   Wt (!) 154 kg   LMP 08/13/2023   SpO2 100%   BMI 56.50 kg/m  Physical Exam Vitals and nursing note reviewed.  Constitutional:      General: She is not in acute distress.    Appearance: She is well-developed. She is obese. She is not ill-appearing.  HENT:     Head: Normocephalic and atraumatic.  Eyes:     Extraocular Movements: Extraocular movements intact.     Pupils: Pupils are equal, round, and reactive to light.  Cardiovascular:     Rate and Rhythm: Normal rate and regular  rhythm.  Pulmonary:     Effort: Pulmonary effort is normal. No tachypnea or respiratory distress.     Breath sounds: Normal breath sounds.  Chest:     Chest wall: No tenderness.  Abdominal:     Palpations: Abdomen is soft.     Tenderness: There is no abdominal tenderness.  Musculoskeletal:        General: Normal range of motion.     Cervical back: Normal range of motion.     Right lower leg: No edema.     Left lower leg: No edema.  Skin:    General: Skin is warm and dry.  Neurological:     Mental Status: She is oriented to person, place, and time.     Motor: No weakness.  Psychiatric:        Mood and Affect: Mood normal.        Behavior: Behavior normal.    Pt informed that the ultrasound is considered a limited OB ultrasound and is not intended to be a complete ultrasound exam.  Patient also informed that the ultrasound is not being completed with the intent of assessing for fetal or placental anomalies or any pelvic abnormalities.  Explained that the purpose of today's ultrasound is to assess for  viability.  Patient acknowledges  the purpose of the exam and the limitations of the study.    My interpretation: was unable to get adequate transabdominal views given body habitus and loops of bowel     MDM: MAU Course: Concerning for ?PE vs ACS though could be MSK given specific area in anterior R chest wall. However, no reproducible with TTP on exam.   EKG: normal EKG, normal sinus rhythm, unchanged from previous tracings.Improved Qtc.  Ventricular rate 70s.    Ddimer <0.27 Trop <2  Anti-Xa > 4hrs after dose  PT/INR wnl APTT wnl  CBCdiff WBC 12.1, Plt 414, Hgb 11.8 CMP reassuring, Cr 0.46  T/S A neg  CTPE = negative   TVUS = SIUP with FHR 138bpm   Gave evening dose of Lovenox . Pt does state given LUE had been overusing RUE and thinks it could be that with negative work up. Stable discomfort however drove herself to ED w/o available ride to receive Flexeril .  Pt  comfortable with having Flexeril  at home and dosing tylenol .  Stable for d/c with strict / usual return precautions.    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   cyclobenzaprine  5 MG tablet Commonly known as: FLEXERIL  Take 1 tablet (5 mg total) by mouth 3 (three) times daily as needed for up to 7 days for muscle spasms.   enoxaparin  150 MG/ML injection Commonly known as: Lovenox  Inject 1 mL (150 mg total) into the skin every 12 (twelve) hours.   oxyCODONE  5 MG immediate release tablet Commonly known as: Roxicodone  Take 1 tablet (5 mg total) by mouth every 4 (four) hours as needed.   prenatal multivitamin Tabs tablet Take 1 tablet by mouth daily at 12 noon.        Ebony Goldstein, MD 10/09/2023 8:52 PM

## 2023-10-11 ENCOUNTER — Other Ambulatory Visit: Payer: Self-pay | Admitting: Obstetrics and Gynecology

## 2023-10-11 DIAGNOSIS — N911 Secondary amenorrhea: Secondary | ICD-10-CM

## 2023-10-14 ENCOUNTER — Encounter: Payer: Self-pay | Admitting: Student-PharmD

## 2023-10-14 ENCOUNTER — Ambulatory Visit: Attending: Surgery | Admitting: Student-PharmD

## 2023-10-14 VITALS — BP 126/88 | HR 88 | Ht 65.0 in | Wt 328.8 lb

## 2023-10-14 DIAGNOSIS — I82622 Acute embolism and thrombosis of deep veins of left upper extremity: Secondary | ICD-10-CM | POA: Diagnosis not present

## 2023-10-14 NOTE — Patient Instructions (Addendum)
-  Continue Lovenox  150 mg (1 syringe) every 12 hours.  -Go on Friday 4 hours after your morning dose. I'll call you with the results.  -It is important to take your medication around the same time every day.  -Avoid NSAIDs like ibuprofen  (Advil , Motrin ) and naproxen (Aleve) as well as aspirin doses over 100 mg daily. -Tylenol  (acetaminophen ) is the preferred over the counter pain medication to lower the risk of bleeding. -Be sure to alert all of your health care providers that you are taking an anticoagulant prior to starting a new medication or having a procedure. -Monitor for signs and symptoms of bleeding (abnormal bruising, prolonged bleeding, nose bleeds, bleeding from gums, discolored urine, black tarry stools). If you have fallen and hit your head OR if your bleeding is severe or not stopping, seek emergency care.  -Go to the emergency room if emergent signs and symptoms of new clot occur (new or worse swelling and pain in an arm or leg, shortness of breath, chest pain, fast or irregular heartbeats, lightheadedness, dizziness, fainting, coughing up blood) or if you experience a significant color change (pale or blue) in the extremity that has the DVT.   If you have any questions or need to reschedule an appointment, please call [phone]. If you are having an emergency, call 911 or present to the nearest emergency room.   What is a DVT?  -Deep vein thrombosis (DVT) is a condition in which a blood clot forms in a vein of the deep venous system which can occur in the lower leg, thigh, pelvis, arm, or neck. This condition is serious and can be life-threatening if the clot travels to the arteries of the lungs and causing a blockage (pulmonary embolism, PE). A DVT can also damage veins in the leg, which can lead to long-term venous disease, leg pain, swelling, discoloration, and ulcers or sores (post-thrombotic syndrome).  -Treatment may include taking an anticoagulant medication to prevent more  clots from forming and the current clot from growing, wearing compression stockings, and/or surgical procedures to remove or dissolve the clot.

## 2023-10-14 NOTE — Progress Notes (Addendum)
 DVT Clinic Note  Name: Martha Potts     MRN: [id number]     DOB: November 23, 1990     Sex: female  PCP: [facility]  Today's Visit: Visit Information: Initial Visit  Referred to DVT Clinic by: Emergency Department - Dr. Adan Holms Referred to CPP by: Dr. Charlotte Cookey Reason for referral:  Chief Complaint  Patient presents with   Med Management - DVT   HISTORY OF PRESENT ILLNESS: Martha Potts is a 33 y.o. G2P1 pregnant female with PMH uterus didelphus, bicornate uterus, who presents after diagnosis of DVT for medication management. Patient initially presented to the ED 09/30/23 reporting left forearm pain around the site of a recent IV placed in the left forearm on 09/25/23 at an outside ED. Ultrasound showed left brachial DVT and superficial thrombophlebitis in the cephalic vein. She was discharged on Lovenox  1 mg/kg q12h. She then presented to the MAU on 10/09/23 reporting chest pain and SOB, CTA negative for PE. She was referred to DVT Clinic for follow up.   Today, patient reports no prior history of DVT. Onset of DVT symptoms of swelling and redness was 2 days after difficult IV placement in left arm. Since starting Lovenox  she has seen improvements in swelling. Still experiencing some tenderness, mostly around the thrombophlebitis in the cephalic vein. Reports some bruising and nodules around the Lovenox  injection sites. Rotating sites in abdomen. Otherwise denies abnormal bleeding or bruising. Reports 1 missed doses of Lovenox  over the weekend. Taking it at 6:45am and 7-8pm. Reports that she saw her OB last week and they wanted her seen here as well. She had been taking a combined oral contraceptive at the time of discovering she was pregnant, stopped taking the COC on 09/10/23 the day she found out she was pregnant. Reports weight loss over the last 1-2 weeks, likely related to nausea. Tells me that she has heard multiple things about duration of treatment, ranging from 3 months, 6  months, to duration of pregnancy.   Positive Thrombotic Risk Factors: Pregnancy, Obesity, Other (comment) (IV placed in left forearm 4 days before diagnosis, recent estrogen use (discontinued ~2-3 weeks prior to DVT diagnosis)) Bleeding Risk Factors: Anticoagulant therapy  Negative Thrombotic Risk Factors: Previous VTE, Recent surgery (within 3 months), Recent trauma (within 3 months), Recent admission to hospital with acute illness (within 3 months), Paralysis, paresis, or recent plaster cast immobilization of lower extremity, Central venous catheterization, Bed rest >72 hours within 3 months, Sedentary journey lasting >8 hours within 4 weeks, Within 6 weeks postpartum, Recent cesarean section (within 3 months), Estrogen therapy, Testosterone therapy, Erythropoiesis-stimulating agent, Recent COVID diagnosis (within 3 months), Active cancer, Non-malignant, chronic inflammatory condition, Known thrombophilic condition, Smoking, Older age  Rx Insurance Coverage: Commercial Rx Affordability: Patient reports cost was affordable.  Rx Assistance Provided: None needed at this time Preferred Pharmacy: [facility] on [address]   Past Medical History:  Diagnosis Date   Uterus didelphus     Past Surgical History:  Procedure Laterality Date   CERVIX SURGERY     CESAREAN SECTION     CHOLECYSTECTOMY N/A 04/22/2016   Procedure: LAPAROSCOPIC CHOLECYSTECTOMY;  Surgeon: Gwyndolyn Lerner, MD;  Location: [facility] ORS;  Service: General;  Laterality: N/A;   COLONOSCOPY WITH PROPOFOL  N/A 08/12/2018   Procedure: COLONOSCOPY WITH PROPOFOL ;  Surgeon: Irby Mannan, MD;  Location: [facility] ENDOSCOPY;  Service: Endoscopy;  Laterality: N/A;  Urgent per Dr. Tully Gainer   COLONOSCOPY WITH PROPOFOL  N/A 01/21/2023   Procedure: COLONOSCOPY WITH PROPOFOL ;  Surgeon: Selena Daily, MD;  Location: [facility] ENDOSCOPY;  Service: Gastroenterology;  Laterality: N/A;    Social History   Socioeconomic History   Marital status:  Married    Spouse name: Not on file   Number of children: Not on file   Years of education: Not on file   Highest education level: Not on file  Occupational History   Not on file  Tobacco Use   Smoking status: Never   Smokeless tobacco: Never  Vaping Use   Vaping status: Never Used  Substance and Sexual Activity   Alcohol use: No   Drug use: No   Sexual activity: Yes    Birth control/protection: Pill  Other Topics Concern   Not on file  Social History Narrative   Not on file   Social Drivers of Health   Financial Resource Strain: Low Risk  (08/25/2019)   Received [facility]   Overall Financial Resource Strain (CARDIA)    Difficulty of Paying Living Expenses: Not hard at all  Food Insecurity: No Food Insecurity (08/25/2019)   Received from [facility]   Hunger Vital Sign    Within the past 12 months, you worried that your food would run out before you got the money to buy more.: Never true    Within the past 12 months, the food you bought just didn't last and you didn't have money to get more.: Never true  Transportation Needs: No Transportation Needs (08/25/2019)   Received from [facility] - Transportation    In the past 12 months, has lack of transportation kept you from medical appointments or from getting medications?: No    Lack of Transportation (Non-Medical): No  Physical Activity: Inactive (08/25/2019)   Received from [facility]   Exercise Vital Sign    On average, how many days per week do you engage in moderate to strenuous exercise (like a brisk walk)?: 0 days    On average, how many minutes do you engage in exercise at this level?: 0 min  Stress: Stress Concern Present (08/25/2019)   Received from [facility] - Occupational Stress Questionnaire    Feeling of Stress : To some extent  Social Connections: Moderately  Isolated (08/25/2019)   Received from [facility]   Social Connection and Isolation Panel    In a typical week, how many times do you talk on the phone with family, friends, or neighbors?: More than three times a week    How often do you get together with friends or relatives?: Never    How often do you attend church or religious services?: Never    Do you belong to any clubs or organizations such as church groups, unions, fraternal or athletic groups, or school groups?: No    How often do you attend meetings of the clubs or organizations you belong to?: Never    Are you married, widowed, divorced, separated, never married, or living with a partner?: Married  Intimate Partner Violence: Not At Risk (10/05/2018)   Received from [facility]   Humiliation, Afraid, Rape, and Kick questionnaire    Within the last year, have you been afraid of your partner or ex-partner?: No    Within the last year, have you been humiliated or emotionally abused in other ways by your partner or ex-partner?: No    Within the last year, have  you been kicked, hit, slapped, or otherwise physically hurt by your partner or ex-partner?: No    Within the last year, have you been raped or forced to have any kind of sexual activity by your partner or ex-partner?: No    Family History  Problem Relation Age of Onset   Hypertension Father    Hodgkin's lymphoma Maternal Grandmother    Lung cancer Paternal Grandfather    Cervical cancer Maternal Aunt     Allergies as of 10/14/2023   (No Known Allergies)    Current Outpatient Medications on File Prior to Visit  Medication Sig Dispense Refill   acetaminophen  (TYLENOL ) 325 MG tablet Take 650 mg by mouth every 6 (six) hours as needed.     enoxaparin  (LOVENOX ) 150 MG/ML injection Inject 1 mL (150 mg total) into the skin every 12 (twelve) hours. 60 mL 0   Prenatal Vit-Fe Fumarate-FA (PRENATAL MULTIVITAMIN) TABS tablet Take 1 tablet by mouth daily at 12 noon.      cyclobenzaprine  (FLEXERIL ) 5 MG tablet Take 1 tablet (5 mg total) by mouth 3 (three) times daily as needed for up to 7 days for muscle spasms. (Patient not taking: Reported on 10/14/2023) 21 tablet 0   oxyCODONE  (ROXICODONE ) 5 MG immediate release tablet Take 1 tablet (5 mg total) by mouth every 4 (four) hours as needed. (Patient not taking: Reported on 10/14/2023) 12 tablet 0   No current facility-administered medications on file prior to visit.   REVIEW OF SYSTEMS:  Review of Systems  Cardiovascular:  Negative for chest pain and palpitations.  Musculoskeletal:  Negative for myalgias.  Neurological:  Negative for dizziness and tingling.   PHYSICAL EXAMINATION:  Vitals:   10/14/23 1355  Weight: (!) 328 lb 12.8 oz (149.1 kg)    Body mass index is 54.72 kg/m.  Physical Exam Vitals reviewed.   Cardiovascular:     Rate and Rhythm: Normal rate.  Pulmonary:     Effort: Pulmonary effort is normal.   Musculoskeletal:        General: Tenderness present. No swelling.   Skin:    Findings: Bruising (around Lovenox  injection sites) present. No erythema.   Psychiatric:        Mood and Affect: Mood normal.        Behavior: Behavior normal.        Thought Content: Thought content normal.   LABS:  CBC     Component Value Date/Time   WBC 12.1 (H) 10/09/2023 1748   RBC 4.06 10/09/2023 1748   HGB 11.8 (L) 10/09/2023 1748   HGB 12.4 07/30/2018 1358   HCT 34.8 (L) 10/09/2023 1748   HCT 35.4 07/30/2018 1358   PLT 414 (H) 10/09/2023 1748   PLT 397 07/30/2018 1358   MCV 85.7 10/09/2023 1748   MCV 85 07/30/2018 1358   MCV 88 04/27/2014 0247   MCH 29.1 10/09/2023 1748   MCHC 33.9 10/09/2023 1748   RDW 13.7 10/09/2023 1748   RDW 12.9 07/30/2018 1358   RDW 13.2 04/27/2014 0247   LYMPHSABS 3.4 10/09/2023 1748   LYMPHSABS 4.0 (H) 04/27/2014 0247   MONOABS 0.7 10/09/2023 1748   MONOABS 1.1 (H) 04/27/2014 0247   EOSABS 0.0 10/09/2023 1748   EOSABS 0.0 04/27/2014 0247   BASOSABS 0.1  10/09/2023 1748   BASOSABS 0.0 04/27/2014 0247    Hepatic Function      Component Value Date/Time   PROT 7.1 10/09/2023 1748   PROT 6.7 07/30/2018 1358   PROT 7.8 04/27/2014 0247  ALBUMIN 3.4 (L) 10/09/2023 1748   ALBUMIN 4.1 07/30/2018 1358   ALBUMIN 3.5 04/27/2014 0247   AST 22 10/09/2023 1748   AST 19 04/27/2014 0247   ALT 23 10/09/2023 1748   ALT 28 04/27/2014 0247   ALKPHOS 98 10/09/2023 1748   ALKPHOS 109 04/27/2014 0247   BILITOT 0.7 10/09/2023 1748   BILITOT 0.2 07/30/2018 1358   BILITOT 0.3 04/27/2014 0247   BILIDIR <0.1 03/05/2023 0936   IBILI NOT CALCULATED 03/05/2023 0936    Renal Function   Lab Results  Component Value Date   CREATININE 0.46 10/09/2023   CREATININE 0.64 09/30/2023   CREATININE 0.64 03/05/2023    Estimated Creatinine Clearance: 150.9 mL/min (by C-G formula based on SCr of 0.46 mg/dL).   VVS Vascular Lab Studies:  09/30/23 VAS US  UPPER EXTREMITY VENOUS DUPLEX LEFT  Summary:  Right:  No evidence of thrombosis in the subclavian.    Left:  Findings consistent with acute deep vein thrombosis involving the left  brachial veins. Findings consistent with acute superficial vein thrombosis  involving the left cephalic vein.   10/09/23 CT angio IMPRESSION: 1. Negative for acute pulmonary embolism. 2. No acute abnormality in the chest. 3. Hepatic steatosis.  ASSESSMENT: Location of DVT: Left upper extremity Cause of DVT: provoked by a transient risk factor  Patient without prior history of DVT diagnosed with an acute DVT in the left brachial veins and superficial vein thrombosis in the left cephalic vein s/p difficult IV placement in the left arm. DVT symptom onset was within a few days after the IV was placed. Other DVT risk factors present include pregnancy and obesity. She had been taking a COC and stopped taking this when she found out she was pregnant, which was ~2-3 weeks prior to DVT symptom onset. Symptoms have started to improve since  starting Lovenox . She is appropriately on Lovenox  1 mg/kg (150 mg) every 12 hours. An anti-Xa level was drawn during her recent MAU visit but this was not a 4 hour level (was drawn about 11 hours after the last dose given) and should be disregarded. In light of pregnancy and weight fluctuations, will monitor anti-Xa levels (goal 0.6-1.0) while on Lovenox  and time levels for 4 hours after her morning dose, which is usually at 0645. She is able to return on Friday for labs and confirms understanding regarding what time to arrive to the lab. Will also check a CBC at that time. Discussed patient with vascular surgeon Dr. Charlotte Cookey in the office today. Patient is not indicated for vascular surgery intervention. Even though DVT appears to be provoked by recent IV placement, in light of her pregnancy he recommends treating conservatively and extending the duration of anticoagulation through the remainder of her pregnancy and 6 weeks postpartum as long as she is having no bleeding complications. She has an appointment with her OB tomorrow and establishes with MFM next month and will discuss duration with them as well. Patient says she personally feels most comfortable with a conservative approach. Counseled patient extensively on Lovenox , and all questions have been answered at this time.   PLAN: -Continue enoxaparin  (Lovenox ) 1 mg/kg (150 mg) every 12 hours. -Expected duration of therapy: we recommend anticoagulating for the duration of the pregnancy + 6 weeks postpartum. Therapy started on 09/30/23. -Patient educated on purpose, proper use and potential adverse effects of enoxaparin  (Lovenox ). -Discussed importance of taking medication around the same time every day. -Advised patient of medications to avoid (NSAIDs, aspirin doses >100 mg  daily). -Educated that Tylenol  (acetaminophen ) is the preferred analgesic to lower the risk of bleeding. -Advised patient to alert all providers of anticoagulation therapy prior to  starting a new medication or having a procedure. -Emphasized importance of monitoring for signs and symptoms of bleeding (abnormal bruising, prolonged bleeding, nose bleeds, bleeding from gums, discolored urine, black tarry stools). -Educated patient to present to the ED if emergent signs and symptoms of new thrombosis occur.  Follow up: Patient to return for labs later this week, will call patient with results and schedule next follow up based on this.   Faye Hoops, PharmD, [place], CPP Deep Vein Thrombosis Clinic Clinical Pharmacist Practitioner [phone]   I have evaluated the patient's chart/imaging and refer this patient to the Clinical Pharmacist Practitioner for medication management. I have reviewed the CPP's documentation and agree with her assessment and plan. I was immediately available during the visit for questions and collaboration.   Gareld June, MD

## 2023-10-15 LAB — HEPATITIS C ANTIBODY: HCV Ab: NEGATIVE

## 2023-10-15 LAB — OB RESULTS CONSOLE RUBELLA ANTIBODY, IGM: Rubella: IMMUNE

## 2023-10-15 LAB — OB RESULTS CONSOLE RPR: RPR: NONREACTIVE

## 2023-10-15 LAB — OB RESULTS CONSOLE HIV ANTIBODY (ROUTINE TESTING): HIV: NONREACTIVE

## 2023-10-15 LAB — OB RESULTS CONSOLE HEPATITIS B SURFACE ANTIGEN: Hepatitis B Surface Ag: NEGATIVE

## 2023-10-18 ENCOUNTER — Other Ambulatory Visit: Payer: Self-pay

## 2023-10-18 ENCOUNTER — Other Ambulatory Visit: Payer: Self-pay | Admitting: Student-PharmD

## 2023-10-18 ENCOUNTER — Other Ambulatory Visit (HOSPITAL_COMMUNITY): Payer: Self-pay

## 2023-10-18 DIAGNOSIS — I82622 Acute embolism and thrombosis of deep veins of left upper extremity: Secondary | ICD-10-CM

## 2023-10-18 LAB — CBC
MCH: 29 pg (ref 26.6–33.0)
MCV: 92 fL (ref 79–97)
Platelets: 443 10*3/uL (ref 150–450)
WBC: 10.6 10*3/uL (ref 3.4–10.8)

## 2023-10-18 MED ORDER — ENOXAPARIN SODIUM 150 MG/ML IJ SOSY
150.0000 mg | PREFILLED_SYRINGE | Freq: Two times a day (BID) | INTRAMUSCULAR | 0 refills | Status: DC
  Filled 2023-10-18: qty 14, 7d supply, fill #0

## 2023-10-18 NOTE — Progress Notes (Signed)
 Patient presented today for labs which were successfully drawn but notes that she has not been able to get her Lovenox  refill from [facility], and she will run out of syringes tomorrow. We have this in stock at [facility], sent in a 7 day supply of Lovenox  that she can pick up today. Will await results of her anti-Xa level that was drawn today and send more refills at that time once we can confirm this is the best dose for her.

## 2023-10-20 LAB — CBC
Hematocrit: 37.2 % (ref 34.0–46.6)
Hemoglobin: 11.7 g/dL (ref 11.1–15.9)
MCHC: 31.5 g/dL (ref 31.5–35.7)
RBC: 4.04 x10E6/uL (ref 3.77–5.28)
RDW: 13.7 % (ref 11.7–15.4)

## 2023-10-20 LAB — HEPARIN ANTI-XA: Heparin Anti-Xa: 1.02 [IU]/mL

## 2023-10-21 ENCOUNTER — Ambulatory Visit: Payer: Self-pay | Admitting: Student-PharmD

## 2023-10-21 ENCOUNTER — Other Ambulatory Visit (HOSPITAL_COMMUNITY): Payer: Self-pay

## 2023-10-21 DIAGNOSIS — I82622 Acute embolism and thrombosis of deep veins of left upper extremity: Secondary | ICD-10-CM

## 2023-10-21 MED ORDER — ENOXAPARIN SODIUM 150 MG/ML IJ SOSY
135.0000 mg | PREFILLED_SYRINGE | Freq: Two times a day (BID) | INTRAMUSCULAR | 0 refills | Status: DC
  Filled 2023-10-21 – 2023-10-23 (×2): qty 60, 30d supply, fill #0

## 2023-10-23 ENCOUNTER — Other Ambulatory Visit (HOSPITAL_COMMUNITY): Payer: Self-pay

## 2023-10-29 ENCOUNTER — Inpatient Hospital Stay (HOSPITAL_COMMUNITY)

## 2023-10-29 ENCOUNTER — Inpatient Hospital Stay (HOSPITAL_COMMUNITY)
Admission: AD | Admit: 2023-10-29 | Discharge: 2023-10-29 | Disposition: A | Discharge status: Home / Self Care | Attending: Obstetrics and Gynecology | Admitting: Obstetrics and Gynecology

## 2023-10-29 ENCOUNTER — Encounter (HOSPITAL_COMMUNITY): Payer: Self-pay | Admitting: Obstetrics and Gynecology

## 2023-10-29 DIAGNOSIS — R1032 Left lower quadrant pain: Secondary | ICD-10-CM | POA: Diagnosis not present

## 2023-10-29 DIAGNOSIS — O26891 Other specified pregnancy related conditions, first trimester: Secondary | ICD-10-CM | POA: Diagnosis not present

## 2023-10-29 DIAGNOSIS — R109 Unspecified abdominal pain: Secondary | ICD-10-CM | POA: Insufficient documentation

## 2023-10-29 DIAGNOSIS — Z3A09 9 weeks gestation of pregnancy: Secondary | ICD-10-CM | POA: Diagnosis not present

## 2023-10-29 LAB — URINALYSIS, ROUTINE W REFLEX MICROSCOPIC
Bilirubin Urine: NEGATIVE
Glucose, UA: NEGATIVE mg/dL
Hgb urine dipstick: NEGATIVE
Ketones, ur: NEGATIVE mg/dL
Leukocytes,Ua: NEGATIVE
Nitrite: NEGATIVE
Protein, ur: 100 mg/dL — AB
Specific Gravity, Urine: 1.024 (ref 1.005–1.030)
pH: 6 (ref 5.0–8.0)

## 2023-10-29 LAB — WET PREP, GENITAL
Clue Cells Wet Prep HPF POC: NONE SEEN
Sperm: NONE SEEN
Trich, Wet Prep: NONE SEEN
WBC, Wet Prep HPF POC: <10 (ref <10)
Yeast Wet Prep HPF POC: NONE SEEN

## 2023-10-29 NOTE — MAU Provider Note (Signed)
 History     CSN: [id number]  Arrival date and time: 10/29/23 2134   Event Date/Time   First Provider Initiated Contact with Patient 10/29/23 2327      Chief Complaint  Patient presents with   Abdominal Pain    Martha Potts is a 33 y.o. G2P1001 at [redacted]w[redacted]d who receives care at [facility].  She presents today for abdominal pain.  She states the pain is located on left side and started this morning.  She states initially it was not consistent, but got progressively worse and consistent.  She states the intensity does vary and is sharp.  She reports the pain has no worsening or relieving factors.  She rates the pain a 6-7/10.  She denies vaginal bleeding or discharge of concern. She does report some constipation, but no issues with urination or diarrhea.   OB History     Gravida  2   Para  1   Term  1   Preterm      AB      Living         SAB      IAB      Ectopic      Multiple      Live Births              Past Medical History:  Diagnosis Date   Deep vein phlebitis of upper extremity 09/2023   L arm   Uterus didelphus     Past Surgical History:  Procedure Laterality Date   CERVIX SURGERY     CESAREAN SECTION     CHOLECYSTECTOMY N/A 04/22/2016   Procedure: LAPAROSCOPIC CHOLECYSTECTOMY;  Surgeon: Carlin Pastel, MD;  Location: [facility] ORS;  Service: General;  Laterality: N/A;   COLONOSCOPY WITH PROPOFOL  N/A 08/12/2018   Procedure: COLONOSCOPY WITH PROPOFOL ;  Surgeon: [name], MD;  Location: [facility] ENDOSCOPY;  Service: Endoscopy;  Laterality: N/A;  Urgent per Dr. Janalyn   COLONOSCOPY WITH PROPOFOL  N/A 01/21/2023   Procedure: COLONOSCOPY WITH PROPOFOL ;  Surgeon: Unk [name], MD;  Location: [facility] ENDOSCOPY;  Service: Gastroenterology;  Laterality: N/A;    Family History  Problem Relation Age of Onset   Hypertension Father    Hodgkin's lymphoma Maternal Grandmother    Lung cancer Paternal Grandfather    Cervical cancer  Maternal Aunt     Social History   Tobacco Use   Smoking status: Never   Smokeless tobacco: Never  Vaping Use   Vaping status: Never Used  Substance Use Topics   Alcohol use: No   Drug use: No    Allergies: No Known Allergies  Medications Prior to Admission  Medication Sig Dispense Refill Last Dose/Taking   acetaminophen  (TYLENOL ) 325 MG tablet Take 650 mg by mouth every 6 (six) hours as needed.   Past Month   enoxaparin  (LOVENOX ) 150 MG/ML injection Inject 0.9 mLs (135 mg total) into the skin every 12 (twelve) hours. 60 mL 0 10/29/2023   Prenatal Vit-Fe Fumarate-FA (PRENATAL MULTIVITAMIN) TABS tablet Take 1 tablet by mouth daily at 12 noon.   10/29/2023    Review of Systems  Constitutional:  Negative for chills and fever.  Eyes:  Negative for visual disturbance.  Gastrointestinal:  Positive for abdominal pain (Left side), constipation (Hard to pass, tiny pebbles.), nausea and vomiting. Negative for diarrhea.  Genitourinary:  Negative for difficulty urinating, dysuria, vaginal bleeding and vaginal discharge.  Neurological:  Positive for light-headedness (With fast movement). Negative for dizziness and headaches.  Physical Exam   Blood pressure 134/82, pulse 86, temperature (!) 97.5 F (36.4 C), resp. rate 18, height 5' 5 (1.651 m), weight (!) 146.1 kg, last menstrual period 08/13/2023, SpO2 100%.  Physical Exam Vitals and nursing note reviewed.  Constitutional:      General: She is not in acute distress.    Appearance: Normal appearance. She is well-developed. She is obese. She is not toxic-appearing.  HENT:     Head: Normocephalic and atraumatic.  Eyes:     Conjunctiva/sclera: Conjunctivae normal.  Cardiovascular:     Rate and Rhythm: Normal rate and regular rhythm.  Pulmonary:     Effort: Pulmonary effort is normal. No respiratory distress.     Breath sounds: Normal breath sounds.  Abdominal:     General: Bowel sounds are normal.     Palpations: Abdomen is soft.      Tenderness: There is abdominal tenderness in the left lower quadrant. There is no guarding or rebound.     Comments: Small quarter sized bruising noted in LUQ c/w injection site.   Musculoskeletal:        General: Normal range of motion.     Cervical back: Normal range of motion.  Skin:    General: Skin is warm and dry.  Neurological:     Mental Status: She is alert and oriented to person, place, and time.  Psychiatric:        Mood and Affect: Mood normal.        Behavior: Behavior normal.     MAU Course  Procedures Results for orders placed or performed during the hospital encounter of 10/29/23 (from the past 24 hours)  Urinalysis, Routine w reflex microscopic -Urine, Clean Catch     Status: Abnormal   Collection Time: 10/29/23  9:51 PM  Result Value Ref Range   Color, Urine YELLOW YELLOW   APPearance HAZY (A) CLEAR   Specific Gravity, Urine 1.024 1.005 - 1.030   pH 6.0 5.0 - 8.0   Glucose, UA NEGATIVE NEGATIVE mg/dL   Hgb urine dipstick NEGATIVE NEGATIVE   Bilirubin Urine NEGATIVE NEGATIVE   Ketones, ur NEGATIVE NEGATIVE mg/dL   Protein, ur 899 (A) NEGATIVE mg/dL   Nitrite NEGATIVE NEGATIVE   Leukocytes,Ua NEGATIVE NEGATIVE   RBC / HPF 0-5 0 - 5 RBC/hpf   WBC, UA 0-5 0 - 5 WBC/hpf   Bacteria, UA RARE (A) NONE SEEN   Squamous Epithelial / HPF 0-5 0 - 5 /HPF   Mucus PRESENT    Ca Oxalate Crys, UA PRESENT   Wet prep, genital     Status: None   Collection Time: 10/29/23 10:28 PM   Specimen: Vaginal  Result Value Ref Range   Yeast Wet Prep HPF POC NONE SEEN NONE SEEN   Trich, Wet Prep NONE SEEN NONE SEEN   Clue Cells Wet Prep HPF POC NONE SEEN NONE SEEN   WBC, Wet Prep HPF POC <10 <10   Sperm NONE SEEN    No results found.  MDM Physical Exam Cultures: Wet Prep and GC/CT Ultrasound Assessment and Plan  33 year old, G2P1001  SIUP at 9.6 weeks Abdominal Pain  -Orders placed while patient in triage. -Swabs completed by patient.  -Results as above. US  report  pending.  -Reviewed findings with patient. -Exam performed.  -Patient offered and declines pain medication. -US  reviewed showing SIUP c/w dates with FHR of 164. Normal appearing ovaries.  -Discussed with patient and informed that abdominal pain likely not pregnancy related.  -  Reviewed options including transfer to [facility] for further evaluation, follow up with primary provider, and/or discharge.  -Patient desires discharge today and states she will continue to monitor.  -Discussed usage of tylenol  for pain management.  -Patient verbalizes understanding and without questions or further concerns. -Informed that if formal US  returns showing any findings requiring follow up or explanation, provider will send mychart message.  Patient agreeable.  -Encouraged to call primary office or return to MAU if symptoms worsen or with the onset of new symptoms. -Discharged to home in stable condition.   Harlene LITTIE Duncans MSN, CNM Advanced Practice Provider, [facility] 10/29/2023, 11:27 PM

## 2023-10-29 NOTE — MAU Note (Signed)
 Pt offered pain medication but does not want to take anything.

## 2023-10-29 NOTE — Progress Notes (Signed)
 Written and verbal d/c instructions given and understanding voiced.

## 2023-10-29 NOTE — MAU Note (Signed)
 Martha Potts is a 33 y.o. at [redacted]w[redacted]d here in MAU reporting pain in lower left abd since this am. It has gotten worse in the last 5 hours. No vag bleeding. Does have n/v but that is not new. No makes pain better or worse. Has not taken anything for pain  LMP: n/a Onset of complaint: this am Pain score: 6 Vitals:   10/29/23 2151 10/29/23 2154  BP:  134/82  Pulse: 86   Resp: 18   Temp: (!) 97.5 F (36.4 C)   SpO2: 100%      FHT: n/a  Lab orders placed from triage: u/a

## 2023-10-30 LAB — GC/CHLAMYDIA PROBE AMP (~~LOC~~) NOT AT ARMC
Chlamydia: NEGATIVE
Comment: NEGATIVE
Comment: NORMAL
Neisseria Gonorrhea: NEGATIVE

## 2023-11-05 DIAGNOSIS — Z86718 Personal history of other venous thrombosis and embolism: Secondary | ICD-10-CM | POA: Insufficient documentation

## 2023-11-12 ENCOUNTER — Telehealth: Payer: Self-pay | Admitting: Student-PharmD

## 2023-11-12 NOTE — Telephone Encounter (Signed)
 Patient was due to come in to our lab to have her anti-Xa level checked to confirm appropriate Lovenox  dosing on 10/29/23 after a dose change. We have called and left messages multiple times over the past two weeks and been unable to reach the patient, and she has not returned our calls. Will await her call back at this time.

## 2023-11-15 ENCOUNTER — Ambulatory Visit

## 2023-11-15 ENCOUNTER — Other Ambulatory Visit: Payer: Self-pay | Admitting: *Deleted

## 2023-11-15 ENCOUNTER — Ambulatory Visit: Attending: Obstetrics and Gynecology

## 2023-11-15 ENCOUNTER — Ambulatory Visit: Admitting: Maternal & Fetal Medicine

## 2023-11-15 VITALS — BP 122/75 | HR 87

## 2023-11-15 DIAGNOSIS — N911 Secondary amenorrhea: Secondary | ICD-10-CM | POA: Insufficient documentation

## 2023-11-15 DIAGNOSIS — O2231 Deep phlebothrombosis in pregnancy, first trimester: Secondary | ICD-10-CM | POA: Diagnosis not present

## 2023-11-15 DIAGNOSIS — Z86718 Personal history of other venous thrombosis and embolism: Secondary | ICD-10-CM

## 2023-11-15 DIAGNOSIS — Z3481 Encounter for supervision of other normal pregnancy, first trimester: Secondary | ICD-10-CM | POA: Diagnosis present

## 2023-11-15 DIAGNOSIS — Z3A12 12 weeks gestation of pregnancy: Secondary | ICD-10-CM

## 2023-11-15 DIAGNOSIS — O99211 Obesity complicating pregnancy, first trimester: Secondary | ICD-10-CM | POA: Insufficient documentation

## 2023-11-15 DIAGNOSIS — O34219 Maternal care for unspecified type scar from previous cesarean delivery: Secondary | ICD-10-CM | POA: Insufficient documentation

## 2023-11-15 DIAGNOSIS — O34591 Maternal care for other abnormalities of gravid uterus, first trimester: Secondary | ICD-10-CM | POA: Diagnosis not present

## 2023-11-15 DIAGNOSIS — O285 Abnormal chromosomal and genetic finding on antenatal screening of mother: Secondary | ICD-10-CM

## 2023-11-15 DIAGNOSIS — O3401 Maternal care for unspecified congenital malformation of uterus, first trimester: Secondary | ICD-10-CM

## 2023-11-15 MED ORDER — ASPIRIN 81 MG PO TBEC: 81.0000 mg | DELAYED_RELEASE_TABLET | Freq: Every day | ORAL | 5 refills | Status: DC

## 2023-11-15 NOTE — Progress Notes (Signed)
 Patient information  Patient Name: Martha Potts  Patient MRN:   [id number]  Referring practice: [facility] Referring Provider: [facility]  Problem List   Patient Active Problem List   Diagnosis Date Noted   History of DVT (deep vein thrombosis) 11/05/2023   Rectal bleeding 01/21/2023   Chronic tension-type headache, not intractable 08/25/2019   Family discord 10/05/2018   Post traumatic stress disorder 07/30/2018   Morbid obesity (HCC) 07/25/2018   Maternal Fetal medicine Consult  Martha Potts is a 33 y.o. G2P1001 at [redacted]w[redacted]d here for ultrasound and consultation. Martha Potts is doing well today with  no acute concerns. Today we focused on the following:   Hx of DVT (left arm): The patient was receiving an IV and had what appears to be a traumatic DVT in that arm after this experience.  She was told this is not likely from a clotting disorder but no tests were performed.  Since she is pregnant Lovenox  was recommended throughout her pregnancy.  She has been compliant and has had no difficulties.  She denies any family history or personal history of a blood clot.  Postpartum the dose should be increased to 0.5 mg/kg twice a day for 6 weeks.  Low fetal fraction: We discussed this could be due to the patient's body habitus, Lovenox  use or the early gestational age at the time the test was drawn.  She elected to have a repeat NIPT  Elevated BMI: We discussed the importance of low carbohydrate diet, consistent exercise and attempting to limit weight gain to less than 20 pounds during the pregnancy.  Growth ultrasounds and antenatal testing will be performed later in the pregnancy.  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  This pregnancy is contained within the left uterus.  Her previous pregnancy was of within the right uterus.  I discussed this diagnosis and the management  during pregnancy.  The patient verbalized that she is well-informed about this condition because she has had 1 previous pregnancy.  She has also had renal assessment in both kidneys do appear normal.  I discussed the association with preeclampsia and uterine abnormalities.  We discussed the importance of taking 81 mg of aspirin to reduce the risk of preeclampsia.  The patient was encouraged that she needs a cesarean delivery due to the uterine abnormality and previous C-section.  I discussed that this is not entirely necessary based on this finding alone and she would be a candidate for vaginal birth if she desired but she should continue to discuss this with her OB provider.  The patient had time to ask questions that were answered to her satisfaction.  She verbalized understanding and agrees to proceed with the plan below.  Sonographic findings Single intrauterine pregnancy at 12w 2d contained within the left uterus of a uterine didelphys. Fetal cardiac activity:  Observed and appears normal. Anatomic structures are not able to be definitively evaluated based on the early gestational age but there does not appear to be any major structural abnormalities detectable at this gestational age.  Amniotic fluid: Within normal limits.   Placenta is posterior.  There are limitations of prenatal ultrasound such as the inability to detect certain abnormalities due to poor visualization. Various factors such as fetal position, gestational age and maternal body habitus may increase the difficulty in visualizing the fetal anatomy.    Recommendations - Aneuploidy screening will  be repeated due to low fetal fraction - Anatomy ultrasound to be done around 20 weeks. - Aspirin 81-162 mg from 12 weeks and continued throughout the pregnancy for preeclampsia prophylaxis. - Baseline labs: CMP, CBC, urine protein creatinine ratio. - Blood pressure goal of < 140 systolic and < 90 diastolic. Antihypertensive medication  should be added/adjusted until BP goal is achieved. - Serial growth ultrasounds every 4 weeks starting at 24 to 28 weeks until delivery. - Antenatal testing (usually weekly BPP or NST) weekly at 32-34 weeks until delivery.  Review of Systems: A review of systems was performed and was negative except per HPI   Vitals and Physical Exam    11/15/2023    7:17 AM 10/29/2023   11:55 PM 10/29/2023    9:54 PM  Vitals with BMI  Systolic 122 124 865  Diastolic 75 72 82  Pulse 87 83     Sitting comfortably on the sonogram table Nonlabored breathing Normal rate and rhythm Abdomen is nontender  Past pregnancies OB History  Gravida Para Term Preterm AB Living  2 1 1   1   SAB IAB Ectopic Multiple Live Births          # Outcome Date GA Lbr Len/2nd Weight Sex Type Anes PTL Lv  2 Current           1 Term 05/31/11    M CS-[id number]        I spent 45 minutes reviewing the patients chart, including labs and images as well as counseling the patient about her medical conditions. Greater than 50% of the time was spent in direct face-to-face patient counseling.  [name]  MFM, [facility]   11/15/2023  8:09 AM

## 2023-11-21 ENCOUNTER — Telehealth: Payer: Self-pay

## 2023-11-21 NOTE — Telephone Encounter (Signed)
 I spoke with the patient to return her recent [facility] noninvasive prenatal screening (NIPS) results. The result is low risk, consistent with a female fetus. This screening significantly reduces but does not eliminate the chance that the current pregnancy has Down syndrome (trisomy 8), trisomy 60, trisomy 44, and common sex chromosome conditions. Please see report for details. There are many genetic conditions that cannot be detected by NIPS.  Lauraine Bodily, MS, [facility] Certified Genetic Counselor [facility] [phone]

## 2024-01-10 ENCOUNTER — Ambulatory Visit

## 2024-01-10 ENCOUNTER — Ambulatory Visit: Attending: Maternal & Fetal Medicine | Admitting: Obstetrics and Gynecology

## 2024-01-10 VITALS — BP 110/66 | HR 84

## 2024-01-10 DIAGNOSIS — E66813 Obesity, class 3: Secondary | ICD-10-CM

## 2024-01-10 DIAGNOSIS — O2232 Deep phlebothrombosis in pregnancy, second trimester: Secondary | ICD-10-CM | POA: Diagnosis not present

## 2024-01-10 DIAGNOSIS — O3402 Maternal care for unspecified congenital malformation of uterus, second trimester: Secondary | ICD-10-CM

## 2024-01-10 DIAGNOSIS — O34219 Maternal care for unspecified type scar from previous cesarean delivery: Secondary | ICD-10-CM | POA: Insufficient documentation

## 2024-01-10 DIAGNOSIS — O34592 Maternal care for other abnormalities of gravid uterus, second trimester: Secondary | ICD-10-CM | POA: Insufficient documentation

## 2024-01-10 DIAGNOSIS — O4442 Low lying placenta NOS or without hemorrhage, second trimester: Secondary | ICD-10-CM | POA: Diagnosis not present

## 2024-01-10 DIAGNOSIS — E669 Obesity, unspecified: Secondary | ICD-10-CM

## 2024-01-10 DIAGNOSIS — O285 Abnormal chromosomal and genetic finding on antenatal screening of mother: Secondary | ICD-10-CM | POA: Insufficient documentation

## 2024-01-10 DIAGNOSIS — O99211 Obesity complicating pregnancy, first trimester: Secondary | ICD-10-CM

## 2024-01-10 DIAGNOSIS — O99212 Obesity complicating pregnancy, second trimester: Secondary | ICD-10-CM | POA: Insufficient documentation

## 2024-01-10 DIAGNOSIS — Z86718 Personal history of other venous thrombosis and embolism: Secondary | ICD-10-CM

## 2024-01-10 DIAGNOSIS — Q5128 Other doubling of uterus, other specified: Secondary | ICD-10-CM

## 2024-01-10 DIAGNOSIS — Z363 Encounter for antenatal screening for malformations: Secondary | ICD-10-CM | POA: Diagnosis not present

## 2024-01-10 DIAGNOSIS — Z3A20 20 weeks gestation of pregnancy: Secondary | ICD-10-CM | POA: Insufficient documentation

## 2024-01-10 NOTE — Progress Notes (Signed)
 Maternal-Fetal Medicine Consultation  Name: Martha Potts  MRN: [id number]  GA: [id number] [redacted]w[redacted]d   Patient is here for fetal anatomy scan.  Her high risk problems include: - History of deep vein thrombosis in this pregnancy at [redacted] weeks gestation.  On ultrasound left brachial and cephalic vein thrombosis were detected.  Patient reports she took Lovenox  for 3 to 4 months and had discontinued on the recommendation of her provider (vascular). - Pregravid BMI 55.  She does not have any chronic medical conditions including diabetes or hypertension. Previous cesarean delivery.  Obstetrical history is significant for a term cesarean delivery in 2013 for female infant. 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 - Insufficient fetal DNA on cell free fetal DNA screening.  Repeat myriad screening showed no increased risk for fetal aneuploidies.  Ultrasound We performed a fetal anatomical survey.  Fetal biometry is consistent with the previously established dates.  Amniotic fluid is normal and good fetal activity seen.  No markers of aneuploidies or obvious fetal structural defects are seen.  Fetal anatomical survey was not completed because of fetal position. Low-lying placenta was seen.  Right uterus is empty and the pregnancy is in the left uterus. On transabdominal scan, the cervix looks long and closed. Patient understands the limitations of ultrasound in detecting fetal anomalies.  History of deep vein thrombosis I counseled the patient that obstetricians recommend continuation of anticoagulation through delivery.  Pregnancy is a procoagulant condition and can increase the risk of deep vein thrombosis.  Uterus didelphys I reviewed the cesarean section operative note (2013) that mentioned a small right uterus adherent to the pregnant uterus.  Later, the operative note mentioned incision into the right uterus.  Patient  clearly mentioned that she was pregnant in the right uterus and the incision confirms it. Patient was counseled that the pregnancy is in the left uterus.  I reassured her that we should expect good pregnancy outcomes.  Previous cesarean delivery I reassured the patient of normal placental location and that there is no evidence of previa or placenta accreta spectrum.  I discussed the benefits and risks of VBAC.  The risk of uterine scar dehiscence is about 1%.  Cesarean deliveries increase the risks of hemorrhage, infection and venous thromboembolism.  Repeat cesarean deliveries increase the risks of placenta previa and/or placenta accreta spectrum. Long-term complications include small bowel obstruction.  Based on [facility] calculator, the likelihood of successful vaginal delivery is 27% (without history of arrest in previous labor).  Patient is keen on repeat cesarean delivery.  Pregravid BMI  Grade 3 obesity is independently associated with increased risk of stillbirth (2.5- to 3-fold), but the absolute risk is very small.  I discussed protocol of weekly antenatal testing from [redacted] weeks gestation until delivery. Obesity is associated with increased risk for gestational diabetes and gestational hypertension. Ultrasound has limitations in the resolution of ultrasound images and fetal anomalies may be missed.  Weight loss is not advised in pregnancy and a weight gain of 11 to 20 pounds is reasonable. I explained the components of antenatal testing (biophysical profile) and that if it reassuring, the risk of stillbirth is less than 1 per 1,000 births in one week.  Low-lying placenta I reassured the patient that low-lying placenta usually resolves with advancing gestation.   Recommendations -Fetal growth assessment and completion of anatomy in 4 weeks. - Fetal growth assessments every 4 weeks till delivery. -  Weekly antenatal testing from 32 to [redacted] weeks gestation until  delivery.     Consultation including face-to-face (more than 50%) counseling 45 minutes.

## 2024-01-13 ENCOUNTER — Ambulatory Visit (INDEPENDENT_AMBULATORY_CARE_PROVIDER_SITE_OTHER): Admitting: Obstetrics and Gynecology

## 2024-01-13 ENCOUNTER — Encounter: Payer: Self-pay | Admitting: Obstetrics and Gynecology

## 2024-01-13 ENCOUNTER — Other Ambulatory Visit: Payer: Self-pay | Admitting: *Deleted

## 2024-01-13 VITALS — BP 119/83 | HR 90 | Wt 323.0 lb

## 2024-01-13 DIAGNOSIS — Q5128 Other doubling of uterus, other specified: Secondary | ICD-10-CM | POA: Insufficient documentation

## 2024-01-13 DIAGNOSIS — Z6791 Unspecified blood type, Rh negative: Secondary | ICD-10-CM

## 2024-01-13 DIAGNOSIS — O0990 Supervision of high risk pregnancy, unspecified, unspecified trimester: Secondary | ICD-10-CM

## 2024-01-13 DIAGNOSIS — O99212 Obesity complicating pregnancy, second trimester: Secondary | ICD-10-CM

## 2024-01-13 DIAGNOSIS — Z86718 Personal history of other venous thrombosis and embolism: Secondary | ICD-10-CM | POA: Diagnosis not present

## 2024-01-13 DIAGNOSIS — O26899 Other specified pregnancy related conditions, unspecified trimester: Secondary | ICD-10-CM | POA: Insufficient documentation

## 2024-01-13 DIAGNOSIS — Z7901 Long term (current) use of anticoagulants: Secondary | ICD-10-CM | POA: Diagnosis not present

## 2024-01-13 DIAGNOSIS — Z6841 Body Mass Index (BMI) 40.0 and over, adult: Secondary | ICD-10-CM | POA: Insufficient documentation

## 2024-01-13 DIAGNOSIS — O34592 Maternal care for other abnormalities of gravid uterus, second trimester: Secondary | ICD-10-CM

## 2024-01-13 DIAGNOSIS — O4440 Low lying placenta NOS or without hemorrhage, unspecified trimester: Secondary | ICD-10-CM | POA: Insufficient documentation

## 2024-01-13 DIAGNOSIS — Z362 Encounter for other antenatal screening follow-up: Secondary | ICD-10-CM

## 2024-01-13 DIAGNOSIS — Z3A20 20 weeks gestation of pregnancy: Secondary | ICD-10-CM

## 2024-01-13 DIAGNOSIS — O4442 Low lying placenta NOS or without hemorrhage, second trimester: Secondary | ICD-10-CM

## 2024-01-13 DIAGNOSIS — Z98891 History of uterine scar from previous surgery: Secondary | ICD-10-CM | POA: Insufficient documentation

## 2024-01-13 DIAGNOSIS — O99210 Obesity complicating pregnancy, unspecified trimester: Secondary | ICD-10-CM

## 2024-01-13 NOTE — Progress Notes (Unsigned)
 Transfer ROB   Had anatomy on Friday. 01/10/24  CC: None   Pt consents to female student in exam room

## 2024-01-14 NOTE — Progress Notes (Addendum)
 PRENATAL VISIT NOTE  Transfer of care from [facility] due to Pregnancy Medicaid, per patient  Subjective:  Martha Potts is a 33 y.o. G2P1001 at [redacted]w[redacted]d being seen today for ongoing prenatal care.  She is currently monitored for the following issues for this high-risk pregnancy and has Obesity in pregnancy; Post traumatic stress disorder; Chronic tension-type headache, not intractable; History of DVT (deep vein thrombosis); Supervision of high risk pregnancy, antepartum; BMI 50.0-59.9, adult (HCC); Anticoagulated; Low-lying placenta; Rh negative state in antepartum period; History of cesarean delivery; and Uterus didelphus in pregnancy on their problem list.  Patient reports no complaints.  Contractions: Not present. Vag. Bleeding: None.  Movement: Present. Denies leaking of fluid.   The following portions of the patient's history were reviewed and updated as appropriate: allergies, current medications, past family history, past medical history, past social history, past surgical history and problem list.   Objective:    Vitals:   01/13/24 1436  BP: 119/83  Pulse: 90  Weight: (!) 323 lb (146.5 kg)    Fetal Status:  Fetal Heart Rate (bpm): 147   Movement: Present    General: Alert, oriented and cooperative. Patient is in no acute distress.  Skin: Skin is warm and dry. No rash noted.   Cardiovascular: Normal heart rate noted  Respiratory: Normal respiratory effort, no problems with respiration noted  Abdomen: Soft, gravid, appropriate for gestational age.  Pain/Pressure: Present     Pelvic: Cervical exam deferred        Extremities: Normal range of motion.  Edema: None  Mental Status: Normal mood and affect. Normal behavior. Normal judgment and thought content.   Assessment and Plan:  Pregnancy: G2P1001 at [redacted]w[redacted]d 1. History of DVT (deep vein thrombosis) (Primary) Patient diagnosed June 2nd with left brachial DVT and superficial vein thrombus of left cephalic vein. She  states she was followed by [facility] V[facility] specialists and she was on treatment dose lovenox  but they said she was okay to stop it. She states they attributed the clot due to recent IV placement and they didn't repeat any imaging; she denies any current s/s and no cardiac or pulmonary s/s.  I told her that typically a pregnant patient is kept on treatment dose lovenox  for the duration of the pregnancy and sometimes repeat imaging is done and if the clot is gone, the patient decreased to prophylactic dosing.   Will get asap LUE dopplers and appt with heme to see regarding recs. Will hold off on anti-coagulation for now, pending their recs - Ambulatory referral to Hematology / Oncology - VAS US  UPPER EXTREMITY VENOUS DUPLEX; Future  2. [redacted] weeks gestation of pregnancy Records reviewed from prior practice.  9/12 mfm anatomy wnl Continue low dose ASA - Ambulatory referral to Hematology / Oncology - VAS US  UPPER EXTREMITY VENOUS DUPLEX; Future  3. Anticoagulated Not currently; see above  4. 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 Pt desires repeat c-section  5. Supervision of high risk pregnancy, antepartum  6. Rh negative state in antepartum period  7. Obesity in pregnancy  8. BMI 50.0-59.9, adult (HCC)  9. History of cesarean delivery See above  10. Low-lying placenta Pelvic rest recommended and ED precautions given.   Preterm labor symptoms and general obstetric precautions including but not limited to vaginal bleeding, contractions, leaking of fluid and  fetal movement were reviewed in detail with the patient. Please refer to After Visit Summary for other counseling recommendations.   Return in about 3 weeks (around 02/03/2024) for in person, md visit, high risk ob.  Future Appointments  Date Time Provider  Department Center  01/15/2024  4:30 PM [facility] 10 [facility] H&V  01/17/2024 11:30 AM Jacobo Evalene PARAS, MD [facility] None  01/17/2024 12:45 PM [facility] None  02/03/2024  3:30 PM Eldonna Suzen Octave, MD [facility]  02/10/2024  9:15 AM [facility] PROVIDER 1 [facility]  02/10/2024  9:30 AM [facility] US1 [facility]  03/03/2024  8:30 AM [facility] LAB [facility]  03/03/2024  8:55 AM Anyanwu, Gloris LABOR, MD [facility]  03/17/2024  1:30 PM Anyanwu, Gloris LABOR, MD [facility]    Bebe Furry, MD

## 2024-01-15 ENCOUNTER — Ambulatory Visit (HOSPITAL_COMMUNITY)
Admission: RE | Admit: 2024-01-15 | Discharge: 2024-01-15 | Disposition: A | Discharge status: Home / Self Care | Source: Ambulatory Visit | Attending: Obstetrics and Gynecology | Admitting: Obstetrics and Gynecology

## 2024-01-15 DIAGNOSIS — Z3A20 20 weeks gestation of pregnancy: Secondary | ICD-10-CM | POA: Diagnosis present

## 2024-01-15 DIAGNOSIS — Z86718 Personal history of other venous thrombosis and embolism: Secondary | ICD-10-CM | POA: Diagnosis present

## 2024-01-17 ENCOUNTER — Inpatient Hospital Stay: Attending: Oncology | Admitting: Oncology

## 2024-01-17 ENCOUNTER — Other Ambulatory Visit

## 2024-01-17 ENCOUNTER — Encounter: Payer: Self-pay | Admitting: Oncology

## 2024-01-17 VITALS — BP 96/62 | HR 80 | Temp 97.6°F | Resp 16 | Wt 320.0 lb

## 2024-01-17 DIAGNOSIS — Z9049 Acquired absence of other specified parts of digestive tract: Secondary | ICD-10-CM | POA: Diagnosis not present

## 2024-01-17 DIAGNOSIS — Z3A20 20 weeks gestation of pregnancy: Secondary | ICD-10-CM | POA: Diagnosis not present

## 2024-01-17 DIAGNOSIS — Z86718 Personal history of other venous thrombosis and embolism: Secondary | ICD-10-CM | POA: Insufficient documentation

## 2024-01-17 DIAGNOSIS — Z7982 Long term (current) use of aspirin: Secondary | ICD-10-CM | POA: Diagnosis not present

## 2024-01-17 DIAGNOSIS — Z8049 Family history of malignant neoplasm of other genital organs: Secondary | ICD-10-CM | POA: Diagnosis not present

## 2024-01-17 DIAGNOSIS — Z860100 Personal history of colon polyps, unspecified: Secondary | ICD-10-CM | POA: Insufficient documentation

## 2024-01-17 DIAGNOSIS — Z79899 Other long term (current) drug therapy: Secondary | ICD-10-CM | POA: Insufficient documentation

## 2024-01-17 DIAGNOSIS — O2232 Deep phlebothrombosis in pregnancy, second trimester: Secondary | ICD-10-CM | POA: Diagnosis not present

## 2024-01-17 DIAGNOSIS — Z801 Family history of malignant neoplasm of trachea, bronchus and lung: Secondary | ICD-10-CM | POA: Diagnosis not present

## 2024-01-17 DIAGNOSIS — Z8672 Personal history of thrombophlebitis: Secondary | ICD-10-CM | POA: Diagnosis not present

## 2024-01-17 DIAGNOSIS — Z807 Family history of other malignant neoplasms of lymphoid, hematopoietic and related tissues: Secondary | ICD-10-CM | POA: Diagnosis not present

## 2024-01-17 DIAGNOSIS — Z8249 Family history of ischemic heart disease and other diseases of the circulatory system: Secondary | ICD-10-CM | POA: Diagnosis not present

## 2024-01-17 NOTE — Progress Notes (Signed)
 [facility]  [phone] [phone]  ID: Martha Potts OB: [birth year]  MR#: [id number]  RDW#:[id number]  Patient Care Team: [facility] as PCP - General  CHIEF COMPLAINT: History of right brachial artery DVT.  INTERVAL HISTORY: Patient is a 33 year old female who is in her second trimester of pregnancy who developed a right brachial DVT after a difficult blood draw and IV placement in June 2025.  Patient was subsequently placed on treatment dose of Lovenox  for nearly 3 months.  She has discontinued Lovenox  and recent upper extremity ultrasound was negative for blood clot.  She currently feels well and is asymptomatic.  She has no neurologic complaints.  She denies any recent fevers or illnesses.  She has good appetite and denies weight loss.  She has no chest pain, shortness of breath, cough, or hemoptysis.  She denies any nausea, vomiting, constipation, or diarrhea.  She has no urinary complaints.  Patient offers no specific complaints today.  REVIEW OF SYSTEMS:   Review of Systems  Constitutional: Negative.  Negative for fever, malaise/fatigue and weight loss.  Respiratory: Negative.  Negative for cough, hemoptysis and shortness of breath.   Cardiovascular: Negative.  Negative for chest pain and leg swelling.  Gastrointestinal: Negative.  Negative for abdominal pain.  Genitourinary: Negative.  Negative for dysuria.  Musculoskeletal: Negative.  Negative for back pain.  Skin: Negative.  Negative for rash.  Neurological: Negative.  Negative for dizziness, focal weakness, weakness and headaches.  Psychiatric/Behavioral: Negative.  The patient is not nervous/anxious.     As per HPI. Otherwise, a complete review of systems is negative.  PAST MEDICAL HISTORY: Past Medical History:  Diagnosis Date   Acute cholecystitis due to biliary calculus 04/21/2016   Deep vein phlebitis of upper extremity 09/2023   L arm   Family discord  10/05/2018   Polyp of sigmoid colon    Rectal bleeding 01/21/2023   Uterus didelphus     PAST SURGICAL HISTORY: Past Surgical History:  Procedure Laterality Date   CERVIX SURGERY     CESAREAN SECTION     CHOLECYSTECTOMY N/A 04/22/2016   Procedure: LAPAROSCOPIC CHOLECYSTECTOMY;  Surgeon: Carlin Pastel, MD;  Location: [facility] ORS;  Service: General;  Laterality: N/A;   COLONOSCOPY WITH PROPOFOL  N/A 08/12/2018   Procedure: COLONOSCOPY WITH PROPOFOL ;  Surgeon: [name], MD;  Location: [facility] ENDOSCOPY;  Service: Endoscopy;  Laterality: N/A;  Urgent per Dr. Janalyn   COLONOSCOPY WITH PROPOFOL  N/A 01/21/2023   Procedure: COLONOSCOPY WITH PROPOFOL ;  Surgeon: Unk [name], MD;  Location: [facility] ENDOSCOPY;  Service: Gastroenterology;  Laterality: N/A;    FAMILY HISTORY: Family History  Problem Relation Age of Onset   Hypertension Father    Hodgkin's lymphoma Maternal Grandmother    Lung cancer Paternal Grandfather    Cervical cancer Maternal Aunt     ADVANCED DIRECTIVES (Y/N):  N  HEALTH MAINTENANCE: Social History   Tobacco Use   Smoking status: Never   Smokeless tobacco: Never  Vaping Use   Vaping status: Never Used  Substance Use Topics   Alcohol use: No   Drug use: No     Colonoscopy:  PAP:  Bone density:  Lipid panel:  No Known Allergies  Current Outpatient Medications  Medication Sig Dispense Refill   acetaminophen  (TYLENOL ) 325 MG tablet Take 650 mg by mouth every 6 (six) hours as needed.     aspirin  EC 81 MG tablet Take 1 tablet (81 mg total) by mouth daily. Swallow whole. 90  tablet 5   Prenatal Vit-Fe Fumarate-FA (PRENATAL MULTIVITAMIN) TABS tablet Take 1 tablet by mouth daily at 12 noon.     enoxaparin  (LOVENOX ) 150 MG/ML injection Inject 0.9 mLs (135 mg total) into the skin every 12 (twelve) hours. (Patient not taking: Reported on 01/17/2024) 60 mL 0   No current facility-administered medications for this visit.    OBJECTIVE: Vitals:    01/17/24 1129  BP: 96/62  Pulse: 80  Resp: 16  Temp: 97.6 F (36.4 C)  SpO2: 100%     Body mass index is 53.25 kg/m.    ECOG FS:0 - Asymptomatic  General: Well-developed, well-nourished, no acute distress. Eyes: Pink conjunctiva, anicteric sclera. HEENT: Normocephalic, moist mucous membranes. Lungs: No audible wheezing or coughing. Heart: Regular rate and rhythm. Abdomen: Soft, nontender, no obvious distention. Musculoskeletal: No edema, cyanosis, or clubbing. Neuro: Alert, answering all questions appropriately. Cranial nerves grossly intact. Skin: No rashes or petechiae noted. Psych: Normal affect. Lymphatics: No cervical, calvicular, axillary or inguinal LAD.   LAB RESULTS:  Lab Results  Component Value Date   NA 136 10/09/2023   K 3.8 10/09/2023   CL 105 10/09/2023   CO2 21 (L) 10/09/2023   GLUCOSE 91 10/09/2023   BUN 7 10/09/2023   CREATININE 0.46 10/09/2023   CALCIUM 8.8 (L) 10/09/2023   PROT 7.1 10/09/2023   ALBUMIN 3.4 (L) 10/09/2023   AST 22 10/09/2023   ALT 23 10/09/2023   ALKPHOS 98 10/09/2023   BILITOT 0.7 10/09/2023   GFRNONAA >60 10/09/2023   GFRAA >60 11/02/2019    Lab Results  Component Value Date   WBC 10.6 10/18/2023   NEUTROABS 7.9 (H) 10/09/2023   HGB 11.7 10/18/2023   HCT 37.2 10/18/2023   MCV 92 10/18/2023   PLT 443 10/18/2023     STUDIES: VAS US  UPPER EXTREMITY VENOUS DUPLEX Result Date: 01/16/2024 UPPER VENOUS STUDY  Patient Name:  Martha Potts  Date of Exam:   01/15/2024 Medical Rec #: [id number]          Accession #:    [id number] Date of Birth: May 16, 1990           Patient Gender: F Patient Age:   53 years Exam Location:  [address] Procedure:      VAS US  UPPER EXTREMITY VENOUS DUPLEX Referring Phys: CHARLIE PICKENS --------------------------------------------------------------------------------  Indications: DVT Risk Factors: DVT Hx of DVT. Comparison Study: 09/30/23 Performing Technologist: Garnette Rockers  Examination  Guidelines: A complete evaluation includes B-mode imaging, spectral Doppler, color Doppler, and power Doppler as needed of all accessible portions of each vessel. Bilateral testing is considered an integral part of a complete examination. Limited examinations for reoccurring indications may be performed as noted.  Right Findings: +----------+------------+---------+-----------+----------+-------+ RIGHT     CompressiblePhasicitySpontaneousPropertiesSummary +----------+------------+---------+-----------+----------+-------+ Subclavian               Yes       Yes                      +----------+------------+---------+-----------+----------+-------+  Left Findings: +----------+------------+---------+-----------+----------+-------+ LEFT      CompressiblePhasicitySpontaneousPropertiesSummary +----------+------------+---------+-----------+----------+-------+ IJV           Full       Yes       Yes                      +----------+------------+---------+-----------+----------+-------+ Subclavian               Yes  Yes                      +----------+------------+---------+-----------+----------+-------+ Axillary      Full       Yes       Yes                      +----------+------------+---------+-----------+----------+-------+ Brachial      Full       Yes       Yes                      +----------+------------+---------+-----------+----------+-------+ Radial        Full       Yes       Yes                      +----------+------------+---------+-----------+----------+-------+ Ulnar         Full       Yes       Yes                      +----------+------------+---------+-----------+----------+-------+ Cephalic      Full                 Yes                      +----------+------------+---------+-----------+----------+-------+ Basilic       Full                 Yes                       +----------+------------+---------+-----------+----------+-------+  Summary:  Right: No evidence of thrombosis in the subclavian.  Left: No evidence of deep vein thrombosis in the upper extremity. No evidence of superficial vein thrombosis in the upper extremity.  *See table(s) above for measurements and observations.  Diagnosing physician: Fonda Rim Electronically signed by Fonda Rim on 01/16/2024 at 10:40:26 AM.    Final    US  MFM OB DETAIL +14 WK Result Date: 01/10/2024 ----------------------------------------------------------------------  OBSTETRICS REPORT                       (Signed Final 01/10/2024 05:32 pm) ---------------------------------------------------------------------- Patient Info  ID #:       [id number]                          D.O.B.:  [birth year] (33 yrs)(F)  Name:       Martha Potts               Visit Date: 01/10/2024 07:25 am ---------------------------------------------------------------------- Performed By  Attending:        Fredia Fresh MD        Ref. Address:     [address]                                                             [address]  Performed By:     Comer Harrow       [facility]  Referred By:      [facility] ---------------------------------------------------------------------- Orders  #  Description                           Code        Ordered By  1  US  MFM OB DETAIL +14 WK               76811.01    [name] ----------------------------------------------------------------------  #  Order #                     Accession #                Episode #  1  [id number]                   [id number]                 [id number] ---------------------------------------------------------------------- Indications  Obesity  complicating pregnancy, second         O99.212  trimester (pre-G BMI 55)  Uterine abnormality during pregnancy           O34.599  (Didelphys- 1st preg in RT uterus)  Deep vein thrombosis (DVT) (LT upper arm-      O22.30  Lovenox )  Abnormal chromosomal and genetic finding       O28.5  on antenatal screening of mother  (Insufficient fetal DNA)  Previous cesarean delivery, antepartum         O34.219  Encounter for antenatal screening for          Z36.3  malformations  [redacted] weeks gestation of pregnancy                Z3A.20  LR [facility] NIPS - Female ---------------------------------------------------------------------- Vital Signs  BP:          110/66 ---------------------------------------------------------------------- Fetal Evaluation  Num Of Fetuses:         1  Fetal Heart Rate(bpm):  141  Cardiac Activity:       Observed  Presentation:           Breech  Placenta:               Posterior, low-lying  P. Cord Insertion:      Not well visualized  Amniotic Fluid  AFI FV:      Within normal limits  Largest Pocket(cm)                              4.78 ---------------------------------------------------------------------- Biometry  BPD:      48.2  mm     G. Age:  20w 4d         62  %    CI:        71.37   %    70 - 86                                                          FL/HC:      18.1   %    16.8 - 19.8  HC:      181.7  mm     G. Age:  20w 4d         55  %    HC/AC:      1.10        1.09 - 1.39  AC:      165.7  mm     G. Age:  21w 4d         84  %    FL/BPD:     68.3   %  FL:       32.9  mm     G. Age:  20w 2d         42  %    FL/AC:      19.9   %    20 - 24  HUM:      30.9  mm     G. Age:  20w 2d         50  %  CER:      20.9  mm     G. Age:  20w 0d         57  %  NFT:       4.5  mm  LV:        6.3  mm  CM:        4.7  mm  Est. FW:     388  gm    0 lb 14 oz      81  % ---------------------------------------------------------------------- OB History  Blood Type:   A-  Gravidity:    2          Term:   1  Living:       1 ---------------------------------------------------------------------- Gestational Age  LMP:           21w 3d        Date:  08/13/23                  EDD:   05/19/24  U/S Today:     20w 5d                                        EDD:   05/24/24  Best:          governor 2d     Det. By:  Early Ultrasound         EDD:   05/27/24                                      (  09/30/23) ---------------------------------------------------------------------- Targeted Anatomy  Central Nervous System  Calvarium/Cranial V.:  Appears normal         Cereb./Vermis:          Appears normal  Cavum:                 Not well visualized    Cisterna Magna:         Appears normal  Lateral Ventricles:    Appears normal         Midline Falx:           Appears normal  Choroid Plexus:        Appears normal  Spine  Cervical:              Not well visualized    Sacral:                 Not well visualized  Thoracic:              Not well visualized    Shape/Curvature:        Not well visualized  Lumbar:                Not well visualized  Head/Neck  Lips:                  Appears normal         Profile:                Appears normal  Neck:                  Appears normal         Orbits/Eyes:            Lenses nwv  Nuchal Fold:           Appears normal         Mandible:               Not well visualized  Nasal Bone:            Present                Maxilla:                Not well visualized  Thorax  4 Chamber View:        Not well visualized    Interventr. Septum:     Not well visualized  Cardiac Rhythm:        Normal                 Cardiac Axis:           Normal  Cardiac Situs:         Appears normal         Diaphragm:              Not well visualized  Rt Outflow Tract:      Not well visualized    3 Vessel View:          Not well visualized  Lt Outflow Tract:      Not well visualized    3 V Trachea View:       Not well visualized  Aortic Arch:           Not well visualized    IVC:                    Not well visualized  Ductal  Arch:  Not well visualized    Crossing:               Not well visualized  SVC:                   Not well visualized  Abdomen  Ventral Wall:          Appears normal         Lt Kidney:              Appears normal  Cord Insertion:        Appears normal         Rt Kidney:              Appears normal  Situs:                 Appears normal         Bladder:                Appears normal  Stomach:               Appears normal  Extremities  Lt Humerus:            Appears normal         Lt Femur:               Appears normal  Rt Humerus:            Appears normal         Rt Femur:               Appears normal  Lt Forearm:            Appears normal         Lt Lower Leg:           Appears normal  Rt Forearm:            Appears normal         Rt Lower Leg:           Appears normal  Lt Hand:               Open hand nml          Lt Foot:                Not well visualized  Rt Hand:               Open hand nml          Rt Foot:                Not well visualized  Other  Umbilical Cord:        Normal 3-vessel        Genitalia:              Female-nml  Comment:     Technically difficult due to maternal habitus and fetal position. ---------------------------------------------------------------------- Cervix Uterus Adnexa  Cervix  Length:            4.7  cm.  Normal appearance by transabdominal scan.  Uterus  Uterus didelphys. Pregnancy within maternal left  uterus.  Cul De Sac  No free fluid seen.  Adnexa  No abnormality visualized ---------------------------------------------------------------------- Impression  Patient is here for fetal anatomy scan.  Her high risk  problems include:  - History of deep vein thrombosis in this pregnancy at [redacted]  weeks gestation.  On ultrasound left brachial and cephalic  vein thrombosis were detected.  Patient reports she took  Lovenox  for 3 to 4 months and had discontinued on the  recommendation of her provider (vascular).  - Pregravid BMI 55.  She does not have any chronic medical   conditions including diabetes or hypertension.  Previous cesarean delivery.  Obstetrical history is significant  for a term cesarean delivery in 2013 for female infant.  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  - Insufficient fetal DNA on cell free fetal DNA screening.  Repeat myriad screening showed no increased risk for fetal  aneuploidies.  Ultrasound  We performed a fetal anatomical survey.  Fetal biometry is  consistent with the previously established dates.  Amniotic  fluid is normal and good fetal activity seen.  No markers of  aneuploidies or obvious fetal structural defects are seen.  Fetal anatomical survey was not completed because of fetal  position.  Low-lying placenta was seen.  Right uterus is empty and the  pregnancy is in the left uterus. On transabdominal scan, the  cervix looks long and closed.  Patient understands the limitations of ultrasound in detecting  fetal anomalies.  History of deep vein thrombosis  I counseled the patient that obstetricians recommend  continuation of anticoagulation through delivery.  Pregnancy  is a procoagulant condition and can increase the risk of deep  vein thrombosis.  Uterus didelphys  I reviewed the cesarean section operative note (2013) that  mentioned a small right uterus adherent to the pregnant  uterus.  Later, the operative note mentioned incision into the  right uterus.  Patient clearly mentioned that she was pregnant  in the right uterus and the incision confirms it.  Patient was counseled that the pregnancy is in the left uterus.  I reassured her that we should expect good pregnancy  outcomes.  Previous cesarean delivery  I reassured the patient of normal placental location and that  there is no evidence of previa or placenta accreta spectrum.  I discussed the benefits and risks of VBAC.  The risk of  uterine scar dehiscence is about 1%.  Cesarean deliveries  increase  the risks of hemorrhage, infection and venous  thromboembolism.  Repeat cesarean deliveries increase the  risks of placenta previa and/or placenta accreta spectrum.  Long-term complications include small bowel obstruction.  Based on [facility] calculator, the  likelihood of successful vaginal delivery is 27% (without  history of arrest in previous labor).  Patient is keen on repeat cesarean delivery.  Pregravid BMI  Grade 3 obesity is independently associated with increased  risk of stillbirth (2.5- to 3-fold), but the absolute risk is very  small.  I discussed protocol of weekly antenatal testing from  [redacted] weeks gestation until delivery. Obesity is associated with  increased risk for gestational diabetes and gestational  hypertension.  Ultrasound has limitations in the resolution of ultrasound  images and fetal anomalies may be missed.  Weight loss is  not advised in pregnancy and a weight gain of 11 to 20  pounds is reasonable.  I explained the components of antenatal testing (biophysical  profile) and that if it reassuring, the risk of stillbirth is less  than 1 per 1,000 births in one week.  Low-lying placenta  I reassured the patient that low-lying placenta usually  resolves with advancing gestation. ---------------------------------------------------------------------- Recommendations  -Fetal growth assessment and completion of anatomy in 4  weeks.  - Fetal growth assessments every 4 weeks  till delivery.  - Weekly antenatal testing from 32 to [redacted] weeks gestation until  delivery. ----------------------------------------------------------------------                 Fredia Fresh, MD Electronically Signed Final Report   01/10/2024 05:32 pm ----------------------------------------------------------------------    ASSESSMENT: History of right brachial artery DVT.  PLAN:    History of right brachial artery DVT: Patient's initial DVT diagnosed on September 30, 2023 was likely secondary to  manipulation of her vein from a difficult lab draw and IV placement.  She was placed on Lovenox  and completed over 2 months of treatment.  Patient reports she has been off Lovenox  for approximately 4 to 5 weeks now.  Repeat upper extremity ultrasound earlier this week did not reveal any evidence of DVT.   No further intervention is needed.  Patient does not require additional anticoagulation.  She does not require hypercoagulable workup at this time.  No further follow-up has been scheduled. Pregnancy: Patient reports her due date is in January 2026.  Continue follow-up with high risk pregnancy clinic as scheduled.  I spent a total of 45 minutes reviewing chart data, face-to-face evaluation with the patient, counseling and coordination of care as detailed above.  Patient expressed understanding and was in agreement with this plan. She also understands that She can call clinic at any time with any questions, concerns, or complaints.    Evalene JINNY Reusing, MD   01/17/2024 1:06 PM

## 2024-02-03 ENCOUNTER — Ambulatory Visit (INDEPENDENT_AMBULATORY_CARE_PROVIDER_SITE_OTHER): Admitting: Family Medicine

## 2024-02-03 ENCOUNTER — Encounter: Payer: Self-pay | Admitting: Family Medicine

## 2024-02-03 VITALS — BP 127/84 | HR 103 | Wt 328.0 lb

## 2024-02-03 DIAGNOSIS — O26892 Other specified pregnancy related conditions, second trimester: Secondary | ICD-10-CM | POA: Diagnosis not present

## 2024-02-03 DIAGNOSIS — Z86718 Personal history of other venous thrombosis and embolism: Secondary | ICD-10-CM

## 2024-02-03 DIAGNOSIS — O99210 Obesity complicating pregnancy, unspecified trimester: Secondary | ICD-10-CM | POA: Diagnosis not present

## 2024-02-03 DIAGNOSIS — Z98891 History of uterine scar from previous surgery: Secondary | ICD-10-CM

## 2024-02-03 DIAGNOSIS — Q5128 Other doubling of uterus, other specified: Secondary | ICD-10-CM

## 2024-02-03 DIAGNOSIS — O34592 Maternal care for other abnormalities of gravid uterus, second trimester: Secondary | ICD-10-CM | POA: Diagnosis not present

## 2024-02-03 DIAGNOSIS — O0990 Supervision of high risk pregnancy, unspecified, unspecified trimester: Secondary | ICD-10-CM

## 2024-02-03 DIAGNOSIS — Z3A23 23 weeks gestation of pregnancy: Secondary | ICD-10-CM | POA: Diagnosis not present

## 2024-02-03 DIAGNOSIS — Z6791 Unspecified blood type, Rh negative: Secondary | ICD-10-CM

## 2024-02-03 DIAGNOSIS — Z6841 Body Mass Index (BMI) 40.0 and over, adult: Secondary | ICD-10-CM

## 2024-02-03 NOTE — Progress Notes (Signed)
 PRENATAL VISIT NOTE  Subjective:  Martha Potts is a 33 y.o. G2P1001 at [redacted]w[redacted]d being seen today for ongoing prenatal care.  She is currently monitored for the following issues for this low-risk pregnancy and has Obesity in pregnancy; Post traumatic stress disorder; Chronic tension-type headache, not intractable; History of DVT (deep vein thrombosis); Supervision of high risk pregnancy, antepartum; BMI 50.0-59.9, adult (HCC); Anticoagulated; Low-lying placenta; Rh negative state in antepartum period; History of cesarean delivery; and Uterus didelphus in pregnancy on their problem list.  Patient reports no complaints.  Contractions: Irritability. Vag. Bleeding: None.  Movement: Present. Denies leaking of fluid.   The following portions of the patient's history were reviewed and updated as appropriate: allergies, current medications, past family history, past medical history, past social history, past surgical history and problem list.   Objective:    Vitals:   02/03/24 1329  BP: 127/84  Pulse: (!) 103  Weight: (!) 328 lb (148.8 kg)    Fetal Status:  Fetal Heart Rate (bpm): 152   Movement: Present    General: Alert, oriented and cooperative. Patient is in no acute distress.  Skin: Skin is warm and dry. No rash noted.   Cardiovascular: Normal heart rate noted  Respiratory: Normal respiratory effort, no problems with respiration noted  Abdomen: Soft, gravid, appropriate for gestational age.  Pain/Pressure: Present     Pelvic: Cervical exam deferred        Extremities: Normal range of motion.  Edema: None  Mental Status: Normal mood and affect. Normal behavior. Normal judgment and thought content.   Assessment and Plan:  Pregnancy: G2P1001 at 107w5d 1. [redacted] weeks gestation of pregnancy (Primary)  2. Supervision of high risk pregnancy, antepartum Up to date Feeling movement regularly FHR WNL Concerns: occasional left hip pain   3. Rh negative state in antepartum period Rhogam at  28 weeks  4. 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 Obesity in pregnancy TWG=-3 lb 6.4 oz (-1.542 kg) which is at goal for starting BMI  6. History of cesarean delivery Desires RCS with BTS-- discussed in detail today and is 110% sure she does not want pregnancy and would like sterilization. Reviewed potential for complex anatomy and desired method to remove all fallopian tubes.   7. History of DVT (deep vein thrombosis) Dx 09/30/2023 Stopped lovenox  in August and is followed up hematology who recommended non-continuation Was appropriately counseled by MFM 9/12 about how obstetrically we recommend continuation. Has now been off for nearly 2 months. LUE dopplers were performed and WNL without evidence of clot Shared decision making discussion about benefits of anticoagulation in pregnancy vs risk of no anticoagulation.  Patient desires to NOT restart lovenox  Reviewed the future clot would be mean anticoagulation for lifetime.   8. BMI 50.0-59.9, adult (HCC)   Preterm labor symptoms and general obstetric precautions including but not limited to vaginal bleeding, contractions, leaking of fluid and fetal movement were reviewed in detail with the patient. Please refer to After Visit Summary for other counseling recommendations.   Return in about 4 weeks (around 03/02/2024) for Routine prenatal care, 28 wk labs.  Future Appointments  Date Time Provider Department Center  02/10/2024  9:15 AM [facility] PROVIDER 1 [facility]  02/10/2024  9:30 AM [facility] US1 [facility]  03/03/2024  8:30 AM [facility] LAB [facility]  03/03/2024  8:55 AM Anyanwu, Gloris DELENA, MD [facility]  [facility]  03/17/2024  1:30 PM Anyanwu, Gloris LABOR, MD [facility]    Suzen Maryan Masters, MD

## 2024-02-03 NOTE — Progress Notes (Signed)
 [redacted]w[redacted]d ROB   Flu Vaccine offered: undecided  CC: increase in pressure , sharp pain unsure if BH Ctx's.  Pt notes no longer taking lovenox  shots per hematologist.

## 2024-02-07 ENCOUNTER — Other Ambulatory Visit

## 2024-02-07 ENCOUNTER — Ambulatory Visit

## 2024-02-10 ENCOUNTER — Other Ambulatory Visit: Payer: Self-pay | Admitting: *Deleted

## 2024-02-10 ENCOUNTER — Ambulatory Visit: Attending: Obstetrics and Gynecology | Admitting: Obstetrics

## 2024-02-10 ENCOUNTER — Ambulatory Visit

## 2024-02-10 VITALS — BP 118/77 | HR 89

## 2024-02-10 DIAGNOSIS — O4442 Low lying placenta NOS or without hemorrhage, second trimester: Secondary | ICD-10-CM

## 2024-02-10 DIAGNOSIS — O2232 Deep phlebothrombosis in pregnancy, second trimester: Secondary | ICD-10-CM | POA: Insufficient documentation

## 2024-02-10 DIAGNOSIS — O09812 Supervision of pregnancy resulting from assisted reproductive technology, second trimester: Secondary | ICD-10-CM

## 2024-02-10 DIAGNOSIS — Z3A24 24 weeks gestation of pregnancy: Secondary | ICD-10-CM

## 2024-02-10 DIAGNOSIS — O285 Abnormal chromosomal and genetic finding on antenatal screening of mother: Secondary | ICD-10-CM | POA: Diagnosis not present

## 2024-02-10 DIAGNOSIS — O99212 Obesity complicating pregnancy, second trimester: Secondary | ICD-10-CM

## 2024-02-10 DIAGNOSIS — O34592 Maternal care for other abnormalities of gravid uterus, second trimester: Secondary | ICD-10-CM | POA: Diagnosis not present

## 2024-02-10 DIAGNOSIS — O34219 Maternal care for unspecified type scar from previous cesarean delivery: Secondary | ICD-10-CM | POA: Diagnosis not present

## 2024-02-10 DIAGNOSIS — E669 Obesity, unspecified: Secondary | ICD-10-CM

## 2024-02-10 DIAGNOSIS — O2292 Venous complication in pregnancy, unspecified, second trimester: Secondary | ICD-10-CM

## 2024-02-10 DIAGNOSIS — Q5128 Other doubling of uterus, other specified: Secondary | ICD-10-CM

## 2024-02-10 DIAGNOSIS — Z362 Encounter for other antenatal screening follow-up: Secondary | ICD-10-CM

## 2024-02-10 DIAGNOSIS — Z7901 Long term (current) use of anticoagulants: Secondary | ICD-10-CM | POA: Insufficient documentation

## 2024-02-10 DIAGNOSIS — O0990 Supervision of high risk pregnancy, unspecified, unspecified trimester: Secondary | ICD-10-CM

## 2024-02-10 DIAGNOSIS — Z86718 Personal history of other venous thrombosis and embolism: Secondary | ICD-10-CM

## 2024-02-10 DIAGNOSIS — O99210 Obesity complicating pregnancy, unspecified trimester: Secondary | ICD-10-CM

## 2024-02-10 DIAGNOSIS — O4440 Low lying placenta NOS or without hemorrhage, unspecified trimester: Secondary | ICD-10-CM

## 2024-02-10 DIAGNOSIS — O3402 Maternal care for unspecified congenital malformation of uterus, second trimester: Secondary | ICD-10-CM

## 2024-02-10 NOTE — Progress Notes (Signed)
 MFM Consult Note  Martha Potts is currently at 24 weeks and 5 days.  Martha Potts has been followed due to maternal obesity with a BMI of 55.15.    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  Martha Potts was treated with Lovenox  for 2+ months.  Martha Potts was seen by hematology last month.  Her hematologist does not believe that Martha Potts requires any additional anticoagulation.    Martha Potts denies any problems since her last exam.  A low-lying placenta was also noted during her last ultrasound exam.  Sonographic findings Single intrauterine pregnancy at 24w 5d.  Fetal cardiac activity:  Observed and appears normal. Presentation: Breech. Fetal biometry shows the estimated fetal weight of 1 pound 13 ounces which measures at the 81st percentile. Amniotic fluid volume: Within normal limits. MVP: 4.97 cm. Placenta: Posterior, low-lying.  There are limitations of prenatal ultrasound such as the inability to detect certain abnormalities due to poor visualization. Various factors such as fetal position, gestational age and maternal body habitus may increase the difficulty in visualizing the fetal anatomy.    Martha Potts was advised that the posterior low-lying placenta appears to be resolving.  We will continue to assess the placental location during her future exams.  Due to maternal obesity, we will continue to follow her with monthly growth scans.    Weekly fetal testing will be started at 32 weeks and continued until delivery.    Delivery should occur at around 39 weeks.    As her DVT was probably the result of a difficult blood draw and IV placement and as her hematologist does not recommend any further anticoagulation, Martha Potts will not require any further anticoagulation during her pregnancy.  The increased risk of fetal malpresentation associated with a didelphys uterus was discussed.  The fetus was in the breech presentation today.  The patient has stated that  due to concerns regarding bleeding associated with her resected vaginal septum, Martha Potts will most likely elect to proceed with a repeat cesarean delivery at around 39 weeks.  A follow-up growth scan was scheduled in 4 weeks.    The patient stated that all of her questions were answered today.  A total of 20 minutes was spent counseling and coordinating the care for this patient.  Greater than 50% of the time was spent in direct face-to-face contact.

## 2024-02-13 ENCOUNTER — Inpatient Hospital Stay (HOSPITAL_COMMUNITY)
Admission: AD | Admit: 2024-02-13 | Discharge: 2024-02-14 | Disposition: A | Discharge status: Home / Self Care | Attending: Obstetrics and Gynecology | Admitting: Obstetrics and Gynecology

## 2024-02-13 ENCOUNTER — Encounter (HOSPITAL_COMMUNITY): Payer: Self-pay | Admitting: Obstetrics and Gynecology

## 2024-02-13 DIAGNOSIS — O3402 Maternal care for unspecified congenital malformation of uterus, second trimester: Secondary | ICD-10-CM | POA: Diagnosis not present

## 2024-02-13 DIAGNOSIS — Z113 Encounter for screening for infections with a predominantly sexual mode of transmission: Secondary | ICD-10-CM | POA: Diagnosis present

## 2024-02-13 DIAGNOSIS — N949 Unspecified condition associated with female genital organs and menstrual cycle: Secondary | ICD-10-CM | POA: Insufficient documentation

## 2024-02-13 DIAGNOSIS — O34211 Maternal care for low transverse scar from previous cesarean delivery: Secondary | ICD-10-CM | POA: Insufficient documentation

## 2024-02-13 DIAGNOSIS — Z23 Encounter for immunization: Secondary | ICD-10-CM | POA: Diagnosis not present

## 2024-02-13 DIAGNOSIS — R1022 Pelvic and perineal pain left side: Secondary | ICD-10-CM | POA: Diagnosis present

## 2024-02-13 DIAGNOSIS — O4452 Low lying placenta with hemorrhage, second trimester: Secondary | ICD-10-CM | POA: Diagnosis present

## 2024-02-13 DIAGNOSIS — O99212 Obesity complicating pregnancy, second trimester: Secondary | ICD-10-CM | POA: Diagnosis not present

## 2024-02-13 DIAGNOSIS — M899 Disorder of bone, unspecified: Secondary | ICD-10-CM

## 2024-02-13 DIAGNOSIS — Z3A25 25 weeks gestation of pregnancy: Secondary | ICD-10-CM | POA: Diagnosis not present

## 2024-02-13 DIAGNOSIS — R102 Pelvic and perineal pain unspecified side: Secondary | ICD-10-CM

## 2024-02-13 DIAGNOSIS — O26899 Other specified pregnancy related conditions, unspecified trimester: Secondary | ICD-10-CM | POA: Diagnosis not present

## 2024-02-13 DIAGNOSIS — Z86718 Personal history of other venous thrombosis and embolism: Secondary | ICD-10-CM | POA: Insufficient documentation

## 2024-02-13 DIAGNOSIS — Z3689 Encounter for other specified antenatal screening: Secondary | ICD-10-CM

## 2024-02-13 DIAGNOSIS — O99342 Other mental disorders complicating pregnancy, second trimester: Secondary | ICD-10-CM | POA: Diagnosis not present

## 2024-02-13 DIAGNOSIS — Z6791 Unspecified blood type, Rh negative: Secondary | ICD-10-CM | POA: Insufficient documentation

## 2024-02-13 DIAGNOSIS — F4310 Post-traumatic stress disorder, unspecified: Secondary | ICD-10-CM | POA: Insufficient documentation

## 2024-02-13 DIAGNOSIS — Q5128 Other doubling of uterus, other specified: Secondary | ICD-10-CM | POA: Insufficient documentation

## 2024-02-13 DIAGNOSIS — O4690 Antepartum hemorrhage, unspecified, unspecified trimester: Secondary | ICD-10-CM

## 2024-02-13 LAB — URINALYSIS, ROUTINE W REFLEX MICROSCOPIC
Bilirubin Urine: NEGATIVE
Glucose, UA: NEGATIVE mg/dL
Ketones, ur: NEGATIVE mg/dL
Leukocytes,Ua: NEGATIVE
Nitrite: NEGATIVE
Protein, ur: NEGATIVE mg/dL
Specific Gravity, Urine: 1.009 (ref 1.005–1.030)
pH: 7 (ref 5.0–8.0)

## 2024-02-13 LAB — TYPE AND SCREEN
ABO/RH(D): A NEG
Antibody Screen: NEGATIVE

## 2024-02-13 LAB — WET PREP, GENITAL
Clue Cells Wet Prep HPF POC: NONE SEEN
Sperm: NONE SEEN
Trich, Wet Prep: NONE SEEN
WBC, Wet Prep HPF POC: >=10 — AB (ref <10)
Yeast Wet Prep HPF POC: NONE SEEN

## 2024-02-13 MED ORDER — ACETAMINOPHEN 500 MG PO TABS
1000.0000 mg | ORAL_TABLET | Freq: Once | ORAL | Status: AC
  Administered 2024-02-13: 1000 mg via ORAL
  Filled 2024-02-13: qty 2

## 2024-02-13 MED ORDER — CYCLOBENZAPRINE HCL 5 MG PO TABS
10.0000 mg | ORAL_TABLET | Freq: Once | ORAL | Status: AC
  Administered 2024-02-13: 10 mg via ORAL
  Filled 2024-02-13: qty 2

## 2024-02-13 MED ORDER — RHO D IMMUNE GLOBULIN 1500 UNIT/2ML IJ SOSY
300.0000 ug | PREFILLED_SYRINGE | Freq: Once | INTRAMUSCULAR | Status: AC
  Administered 2024-02-14: 300 ug via INTRAMUSCULAR
  Filled 2024-02-13: qty 2

## 2024-02-13 NOTE — MAU Provider Note (Signed)
 Chief Complaint:  Vaginal Bleeding and Generalized Body Aches   HPI   Martha Potts is a 33 y.o. G2P1001 at [redacted]w[redacted]d who presents to maternity admissions reporting vaginal pain for the past 2 days.  Patient states today she had gone to the bathroom approximately 2 hours ago and when she had wiped she saw some bright red blood on the tissue.  She denies any vaginal itching, burning, or irritation.  She denies any further frank  red vaginal bleeding or abdominal pain.  Patient states she feels good fetal movements.  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, PTSD disorder  Pregnancy Course: Correne Potts  Past Medical History:  Diagnosis Date   Acute cholecystitis due to biliary calculus 04/21/2016   Deep vein phlebitis of upper extremity 09/2023   L arm   Family discord 10/05/2018   Polyp of sigmoid colon    Rectal bleeding 01/21/2023   Uterus didelphus    OB History  Gravida Para Term Preterm AB Living  2 1 1   1   SAB IAB Ectopic Multiple Live Births          # Outcome Date GA Lbr Len/2nd Weight Sex Type Anes PTL Lv  2 Current           1 Term 05/31/11    Martha Potts      Past Surgical History:  Procedure Laterality Date   CERVIX SURGERY     CESAREAN SECTION     CHOLECYSTECTOMY N/A 04/22/2016   Procedure: LAPAROSCOPIC CHOLECYSTECTOMY;  Surgeon: Carlin Pastel, MD;  Location: [facility] ORS;  Service: General;  Laterality: N/A;   COLONOSCOPY WITH PROPOFOL  N/A 08/12/2018   Procedure: COLONOSCOPY WITH PROPOFOL ;  Surgeon: Martha Keene NOVAK, MD;  Location: [facility] ENDOSCOPY;  Service: Endoscopy;  Laterality: N/A;  Urgent per Dr. Janalyn   COLONOSCOPY WITH PROPOFOL  N/A 01/21/2023   Procedure: COLONOSCOPY WITH PROPOFOL ;  Surgeon: Unk [name], MD;  [facility] END[facility];  Service: Gastroenterology;  Laterality: N/A;   Family History  Problem Relation Age of Onset    Hypertension Father    Hodgkin's lymphoma Maternal Grandmother    Lung cancer Paternal Grandfather    Cervical cancer Maternal Aunt    Social History   Tobacco Use   Smoking status: Never   Smokeless tobacco: Never  Vaping Use   Vaping status: Never Used  Substance Use Topics   Alcohol use: No   Drug use: No   No Known Allergies No medications prior to admission.    I have reviewed patient's Past Medical Hx, Surgical Hx, Family Hx, Social Hx, medications and allergies.   ROS  Pertinent items noted in HPI and remainder of comprehensive ROS otherwise negative.   PHYSICAL EXAM  No data found.   Constitutional: Well-developed, morbidly obese female in no acute distress.  Cardiovascular: normal rate & rhythm, warm and well-perfused Respiratory: normal effort, no problems with respiration noted GI: Abd soft, non-tender, gravid, limited abdominal exam secondary to increased body habitus MS: Extremities nontender, limited ROM due to body habitus and pain in the pelvis  Neurologic: Alert and oriented x 4.  Pelvic: Exam chaperoned by Channing Ill Reproducible pain when palpation on the pubic bone c/w pubic symphysis pain Speculum: No pooling, scant dark blood visualized, cervix visually closed       Fetal Tracing: NST  reactive for GA  Baseline: 135-140 Variability:moderate Accelerations: present Decelerations: absent Toco: no  ctx   Labs: Results for orders placed or performed during the hospital encounter of 02/13/24 (from the past 24 hours)  Blood transfusion report - scanned     Status: None ()   Collection Time: 02/14/24 10:02 AM   Narrative   Ordered by an unspecified provider.  Blood transfusion report - scanned     Status: None   Collection Time: 02/14/24 10:02 AM   Narrative   Ordered by an unspecified provider.    Imaging:  No results found.  MDM & MAU COURSE  MDM:  HIGH  Prenatal chart reviewed Physical exam performed with pelvic exam NST for  gestational age and fetal reassurance (reactive NST for gestational age) Wet prep (negative) UA (no evidence of UTI)  No identifiable cause for vaginal bleeding at this time.  Since patient is Rh- we will proceed with administration of RhoGAM after side effects, risks, and benefits were discussed with the patient in detail.   Pain was elicited on the pubic bone which is likely consistent with pubic symphysis pain and did responded to Tylenol /Flexeril .  Will plan to discharge patient home on Flexeril  and arrange for physical therapy and pelvic floor therapy.  Abdominal binder recommended for additional support   MAU Course: Orders Placed This Encounter  Procedures   Wet prep, genital   Urinalysis, Routine w reflex microscopic -Urine, Clean Catch   Ambulatory referral to Physical Therapy   Rh IG workup (includes ABO/Rh)   Type and screen [facility]   Blood transfusion report - scanned   Discharge patient Discharge disposition: 01-Home or Self Care; Discharge patient date: 02/14/2024   Meds ordered this encounter  Medications   rho (d) immune globulin (RHIG/RHOPHYLAC) injection 300 mcg   acetaminophen  (TYLENOL ) tablet 1,000 mg   cyclobenzaprine  (FLEXERIL ) tablet 10 mg   cyclobenzaprine  (FLEXERIL ) 10 MG tablet    Sig: Take 1 tablet (10 mg total) by mouth 2 (two) times daily as needed for muscle spasms.    Dispense:  20 tablet    Refill:  0    Supervising Provider:   PRATT, TANYA S [[id number]]      I have reviewed the patient chart and performed the physical exam . I have ordered & interpreted the lab results and reviewed and interpreted the NST Medications ordered as stated below.  A/P as described below.  Counseling and education provided and patient agreeable  with plan as described below. Verbalized understanding.    ASSESSMENT   1. Pubic bone pain   2. Vaginal bleeding during pregnancy   3. Pain of round ligament during pregnancy   4. [redacted] weeks gestation of  pregnancy   5. NST (non-stress test) reactive     PLAN  Discharge home in stable condition with return precautions.   See AVS for full description of information given to the patient including both verbal and written. Patient verbalized understanding and agrees with the plan as described above.     Follow-up Information     [facility] for [facility] Follow up.   Specialty: Obstetrics and Gynecology Why: As scheduled for ongoing prenatal care Contact information: [address] [place]  [phone]                Allergies as of 02/14/2024   No Known Allergies      Medication List     TAKE these medications    acetaminophen  325 MG tablet Commonly known as: TYLENOL   Take 650 mg by mouth every 6 (six) hours as needed.   aspirin  EC 81 MG tablet Take 1 tablet (81 mg total) by mouth daily. Swallow whole.   cyclobenzaprine  10 MG tablet Commonly known as: FLEXERIL  Take 10 mg by mouth 3 (three) times daily as needed. What changed: Another medication with the same name was added. Make sure you understand how and when to take each.   cyclobenzaprine  10 MG tablet Commonly known as: FLEXERIL  Take 1 tablet (10 mg total) by mouth 2 (two) times daily as needed for muscle spasms. What changed: You were already taking a medication with the same name, and this prescription was added. Make sure you understand how and when to take each.   enoxaparin  150 MG/ML injection Commonly known as: Lovenox  Inject 0.9 mLs (135 mg total) into the skin every 12 (twelve) hours.   prenatal multivitamin Tabs tablet Take 1 tablet by mouth daily at 12 noon.        Olam Dalton, MSN, [facility], Center for [facility]    This chart was dictated using voice recognition software, Dragon. Despite the best efforts of this provider to proofread and correct errors, errors may still occur which can  change documentation meaning.

## 2024-02-13 NOTE — MAU Note (Signed)
 Martha Potts is a 33 y.o. at [redacted]w[redacted]d here in MAU reporting occ sharp pains up R and L side of her abdomen and in her vagina. This has been present for last couple of days. Went to BR 2hours ago and when she wiped she saw bright red blood. Has low lying placenta that at last appt looks like is resolving. No recent intercourse or vag exams. Reports good FM. Preg in L uterus. Very painful in L groin when she lifts her L leg. Unsure if she is contracting.  LMP: na Onset of complaint: 2hours ago Pain score: 7 Vitals:   02/13/24 1952 02/13/24 1959  BP:  137/86  Pulse: 88   Resp: 18   Temp: 97.6 F (36.4 C)   SpO2: 98%      FHT: 155  Lab orders placed from triage: ua

## 2024-02-14 DIAGNOSIS — M899 Disorder of bone, unspecified: Secondary | ICD-10-CM

## 2024-02-14 DIAGNOSIS — R102 Pelvic and perineal pain unspecified side: Secondary | ICD-10-CM

## 2024-02-14 DIAGNOSIS — Z3689 Encounter for other specified antenatal screening: Secondary | ICD-10-CM

## 2024-02-14 DIAGNOSIS — O4690 Antepartum hemorrhage, unspecified, unspecified trimester: Secondary | ICD-10-CM

## 2024-02-14 DIAGNOSIS — O26899 Other specified pregnancy related conditions, unspecified trimester: Secondary | ICD-10-CM

## 2024-02-14 DIAGNOSIS — Z3A25 25 weeks gestation of pregnancy: Secondary | ICD-10-CM

## 2024-02-14 LAB — GC/CHLAMYDIA PROBE AMP (~~LOC~~) NOT AT ARMC
Chlamydia: NEGATIVE
Comment: NEGATIVE
Comment: NORMAL
Neisseria Gonorrhea: NEGATIVE

## 2024-02-14 MED ORDER — CYCLOBENZAPRINE HCL 10 MG PO TABS: 10.0000 mg | ORAL_TABLET | Freq: Two times a day (BID) | ORAL | 0 refills | Status: DC | PRN

## 2024-02-14 NOTE — Discharge Instructions (Addendum)
 Use a maternity support belt Physical therapy for Pubic Symphysis pain Rhogam was given today due to your blood type of A Negative

## 2024-02-15 LAB — RH IG WORKUP (INCLUDES ABO/RH)
Gestational Age(Wks): 25.1
Unit division: 0

## 2024-02-25 ENCOUNTER — Encounter (HOSPITAL_COMMUNITY): Payer: Self-pay | Admitting: Obstetrics and Gynecology

## 2024-02-25 ENCOUNTER — Inpatient Hospital Stay (HOSPITAL_COMMUNITY)

## 2024-02-25 ENCOUNTER — Telehealth: Payer: Self-pay

## 2024-02-25 ENCOUNTER — Inpatient Hospital Stay (HOSPITAL_COMMUNITY)
Admission: AD | Admit: 2024-02-25 | Discharge: 2024-02-25 | Disposition: A | Discharge status: Home / Self Care | Payer: Self-pay | Attending: Obstetrics and Gynecology | Admitting: Obstetrics and Gynecology

## 2024-02-25 ENCOUNTER — Other Ambulatory Visit: Payer: Self-pay

## 2024-02-25 DIAGNOSIS — Z86718 Personal history of other venous thrombosis and embolism: Secondary | ICD-10-CM | POA: Insufficient documentation

## 2024-02-25 DIAGNOSIS — O99212 Obesity complicating pregnancy, second trimester: Secondary | ICD-10-CM | POA: Diagnosis not present

## 2024-02-25 DIAGNOSIS — Z3A26 26 weeks gestation of pregnancy: Secondary | ICD-10-CM | POA: Insufficient documentation

## 2024-02-25 DIAGNOSIS — Z7901 Long term (current) use of anticoagulants: Secondary | ICD-10-CM | POA: Insufficient documentation

## 2024-02-25 DIAGNOSIS — N898 Other specified noninflammatory disorders of vagina: Secondary | ICD-10-CM | POA: Insufficient documentation

## 2024-02-25 DIAGNOSIS — O34219 Maternal care for unspecified type scar from previous cesarean delivery: Secondary | ICD-10-CM | POA: Insufficient documentation

## 2024-02-25 DIAGNOSIS — Z369 Encounter for antenatal screening, unspecified: Secondary | ICD-10-CM | POA: Insufficient documentation

## 2024-02-25 DIAGNOSIS — O26892 Other specified pregnancy related conditions, second trimester: Secondary | ICD-10-CM | POA: Insufficient documentation

## 2024-02-25 DIAGNOSIS — O285 Abnormal chromosomal and genetic finding on antenatal screening of mother: Secondary | ICD-10-CM | POA: Insufficient documentation

## 2024-02-25 DIAGNOSIS — O4442 Low lying placenta NOS or without hemorrhage, second trimester: Secondary | ICD-10-CM | POA: Diagnosis not present

## 2024-02-25 DIAGNOSIS — Z3492 Encounter for supervision of normal pregnancy, unspecified, second trimester: Secondary | ICD-10-CM

## 2024-02-25 DIAGNOSIS — E669 Obesity, unspecified: Secondary | ICD-10-CM

## 2024-02-25 DIAGNOSIS — O418X21 Other specified disorders of amniotic fluid and membranes, second trimester, fetus 1: Secondary | ICD-10-CM

## 2024-02-25 DIAGNOSIS — O99891 Other specified diseases and conditions complicating pregnancy: Secondary | ICD-10-CM | POA: Diagnosis not present

## 2024-02-25 LAB — URINALYSIS, ROUTINE W REFLEX MICROSCOPIC
Bilirubin Urine: NEGATIVE
Glucose, UA: NEGATIVE mg/dL
Ketones, ur: NEGATIVE mg/dL
Leukocytes,Ua: NEGATIVE
Nitrite: NEGATIVE
Protein, ur: NEGATIVE mg/dL
Specific Gravity, Urine: 1.015 (ref 1.005–1.030)
pH: 7 (ref 5.0–8.0)

## 2024-02-25 LAB — CBC
HCT: 34.1 % — ABNORMAL LOW (ref 36.0–46.0)
Hemoglobin: 11.5 g/dL — ABNORMAL LOW (ref 12.0–15.0)
MCH: 29.8 pg (ref 26.0–34.0)
MCHC: 33.7 g/dL (ref 30.0–36.0)
MCV: 88.3 fL (ref 80.0–100.0)
Platelets: 392 K/uL (ref 150–400)
RBC: 3.86 MIL/uL — ABNORMAL LOW (ref 3.87–5.11)
RDW: 13.6 % (ref 11.5–15.5)
WBC: 15.3 K/uL — ABNORMAL HIGH (ref 4.0–10.5)
nRBC: 0 % (ref 0.0–0.2)

## 2024-02-25 LAB — COMPREHENSIVE METABOLIC PANEL WITH GFR
ALT: 19 U/L (ref 0–44)
AST: 20 U/L (ref 15–41)
Albumin: 2.6 g/dL — ABNORMAL LOW (ref 3.5–5.0)
Alkaline Phosphatase: 119 U/L (ref 38–126)
Anion gap: 9 (ref 5–15)
BUN: 5 mg/dL — ABNORMAL LOW (ref 6–20)
CO2: 19 mmol/L — ABNORMAL LOW (ref 22–32)
Calcium: 8.6 mg/dL — ABNORMAL LOW (ref 8.9–10.3)
Chloride: 106 mmol/L (ref 98–111)
Creatinine, Ser: 0.37 mg/dL — ABNORMAL LOW (ref 0.44–1.00)
GFR, Estimated: >60 mL/min (ref >60)
Glucose, Bld: 76 mg/dL (ref 70–99)
Potassium: 3.9 mmol/L (ref 3.5–5.1)
Sodium: 134 mmol/L — ABNORMAL LOW (ref 135–145)
Total Bilirubin: 0.4 mg/dL (ref 0.0–1.2)
Total Protein: 6.2 g/dL — ABNORMAL LOW (ref 6.5–8.1)

## 2024-02-25 LAB — PROTEIN / CREATININE RATIO, URINE
Creatinine, Urine: 101 mg/dL
Protein Creatinine Ratio: 0.12 mg/mg{creat} (ref 0.00–0.15)
Total Protein, Urine: 12 mg/dL

## 2024-02-25 MED ORDER — LACTATED RINGERS IV BOLUS
1000.0000 mL | Freq: Once | INTRAVENOUS | Status: AC
  Administered 2024-02-25: 1000 mL via INTRAVENOUS

## 2024-02-25 MED ORDER — CEFADROXIL 500 MG PO CAPS: 500.0000 mg | ORAL_CAPSULE | Freq: Two times a day (BID) | ORAL | 0 refills | Status: DC

## 2024-02-25 MED ORDER — FLUCONAZOLE 150 MG PO TABS: ORAL_TABLET | ORAL | 0 refills | Status: DC

## 2024-02-25 NOTE — MAU Note (Signed)
 Martha Potts is a 33 y.o. at [redacted]w[redacted]d here in MAU reporting: dark brown vaginal discharge -scant amount and slight dizziness. She reports +FMs, No continuous LOF, no active VB, no blurry vision, headaches, peripheral edema, or RUQ pain, no UTI symptoms.     LMP:  Onset of complaint: by a week Pain score: 5/10 supapubic pain Vitals:   02/25/24 1211  BP: 121/76  Pulse: (!) 115  Resp: 17  Temp: 98 F (36.7 C)  SpO2: 100%     FHT: not done due to weight  Lab orders placed from triage: ua

## 2024-02-25 NOTE — Discharge Instructions (Signed)
 We are so glad you are feeling better! You have been diagnosed with a subchorionic hemorrhage of the placenta. This is very common in pregnancy and can cause unpredictable light vaginal bleeding.   Follow these instructions at home: Stay on bed rest if told to do so by your provider. Do not lift heavy items. Track and write down the number of pads you use each day and how soaked or full they are. Do not have sex until your provider says you can. Do not douche. Do not use tampons. Keep all follow-up visits. Your provider may ask you to have follow-up blood tests or ultrasound tests or both.  Come back to the ED if: You have any bright red bleeding in your vagina. You have a fever. You have very bad cramps in your stomach, back, belly, or pelvis. You pass large clots or tissue. Save any tissue for your provider to look at. You faint. You become light-headed or weak. You feel the need to be seen back in the ED

## 2024-02-25 NOTE — Telephone Encounter (Signed)
 Return call  to [redacted]w[redacted]d OB pt regarding triage vm . Pt c/o vaginal bleeding everyday and pain  Pt had MAU visit on 10/16 bleeding since wearing a pad notes +FM Stated bleeding appears brownish yet is continuous. w/pt hx I consulted with RN at pt needs to report to MAU for evaluation.

## 2024-02-25 NOTE — MAU Provider Note (Signed)
 Chief Complaint:  Vaginal Discharge   HPI  Martha Potts is a 33 y.o. G3P1011 at [redacted]w[redacted]d w/PMHx of DVT, morbid obesity, low-lying placenta, Rh-, uterus didelphys, PTSD, IBS, who presents to maternity admissions reporting persistent brown discharge that has not stopped since last seen ~2 weeks ago. Last night she noticed more than usual with a few days of lightheadedness and malaise. She denies vision changes, HA, pre-syncope, LOF, recent intercourse, CP, SOB, palpitations, or any other complaints at this time. She does report lack of appetite recently and has not been eating and drinking well. She does report positive fetal movement.   Past Medical History:  Diagnosis Date   Acute cholecystitis due to biliary calculus 04/21/2016   Deep vein phlebitis of upper extremity 09/2023   L arm   Family discord 10/05/2018   Polyp of sigmoid colon    Rectal bleeding 01/21/2023   Uterus didelphus    OB History  Gravida Para Term Preterm AB Living  3 1 1  1 1   SAB IAB Ectopic Multiple Live Births  1        # Outcome Date GA Lbr Len/2nd Weight Sex Type Anes PTL Lv  3 Current           2 SAB 2017          1 Term 05/31/11    Martha Potts      Past Surgical History:  Procedure Laterality Date   CERVIX SURGERY     CESAREAN SECTION     CHOLECYSTECTOMY N/A 04/22/2016   Procedure: LAPAROSCOPIC CHOLECYSTECTOMY;  Surgeon: Carlin Pastel, MD;  Location: [facility] ORS;  Service: General;  Laterality: N/A;   COLONOSCOPY WITH PROPOFOL  N/A 08/12/2018   Procedure: COLONOSCOPY WITH PROPOFOL ;  Surgeon: [name], MD;  Location: [facility] ENDOSCOPY;  Service: Endoscopy;  Laterality: N/A;  Urgent per Dr. Janalyn   COLONOSCOPY WITH PROPOFOL  N/A 01/21/2023   Procedure: COLONOSCOPY WITH PROPOFOL ;  Surgeon: Unk [name], MD;  Location: [facility] ENDOSCOPY;  Service: Gastroenterology;  Laterality: N/A;   Family History  Problem Relation Age of Onset   Hypertension Father    Hodgkin's lymphoma Maternal  Grandmother    Lung cancer Paternal Grandfather    Cervical cancer Maternal Aunt    Social History   Tobacco Use   Smoking status: Never   Smokeless tobacco: Never  Vaping Use   Vaping status: Never Used  Substance Use Topics   Alcohol use: No   Drug use: No   No Known Allergies No medications prior to admission.    I have reviewed patient's Past Medical Hx, Surgical Hx, Family Hx, Social Hx, medications and allergies.   ROS  Pertinent items noted in HPI and remainder of comprehensive ROS otherwise negative.   PHYSICAL EXAM  No data found.   Constitutional: Well-developed, well-nourished female in no acute distress.  Cardiovascular: normal rate & rhythm, warm and well-perfused, 2+ radial pulses Respiratory: normal effort, no problems with respiration noted, good air movement throughout all lung fields GI: Abd soft, non-tender, non-distended, gravid uterus MS: Extremities nontender, no edema, normal ROM Neurologic: Alert and oriented x 4.   MDM & MAU COURSE  MDM: moderate  MAU Course: Orders Placed This Encounter  Procedures   Culture, OB Urine   US  MFM OB LIMITED   US  MFM OB Transvaginal   Urinalysis, Routine w reflex microscopic -Urine, Clean Catch   CBC   Comprehensive metabolic panel   Protein / creatinine ratio, urine   EKG 12-Lead  EKG   Discharge patient   Meds ordered this encounter  Medications   lactated ringers  bolus 1,000 mL   cefadroxil (DURICEF) 500 MG capsule    Sig: Take 1 capsule (500 mg total) by mouth 2 (two) times daily.    Dispense:  14 capsule    Refill:  0   fluconazole  (DIFLUCAN ) 150 MG tablet    Sig: If develop yeast infection from antibiotic, take one pill once. Repeat 3 days later if symptoms not improved.    Dispense:  2 tablet    Refill:  0   ASSESSMENT   1. Subchorionic hematoma in second trimester, fetus 1 of multiple gestation   2. [redacted] weeks gestation of pregnancy   3. Movement of fetus present during pregnancy in  second trimester    33yo F G2P1 [redacted]w[redacted]d presenting for persistent brown discharge without abdominal cramping or contractions or leakage of fluid. Consistent with subchorionic hematoma found on US . Discussed unpredictability of this type of vaginal bleeding and what to expect, and return precautions. Malaise and lack of appetite most likely related to UTI. Sending home with appropriate abx. Patient verbalizes understanding and is agreeable with plan.   PLAN  Discharge home in stable condition with return precautions.  Cefadroxil 500mg  BID for 7 days and Diflucan  150mg  sent into pharmacy.     Allergies as of 02/25/2024   No Known Allergies      Medication List     TAKE these medications    acetaminophen  325 MG tablet Commonly known as: TYLENOL  Take 650 mg by mouth every 6 (six) hours as needed.   aspirin  EC 81 MG tablet Take 1 tablet (81 mg total) by mouth daily. Swallow whole.   cefadroxil 500 MG capsule Commonly known as: DURICEF Take 1 capsule (500 mg total) by mouth 2 (two) times daily.   cyclobenzaprine  10 MG tablet Commonly known as: FLEXERIL  Take 10 mg by mouth 3 (three) times daily as needed.   cyclobenzaprine  10 MG tablet Commonly known as: FLEXERIL  Take 1 tablet (10 mg total) by mouth 2 (two) times daily as needed for muscle spasms.   enoxaparin  150 MG/ML injection Commonly known as: Lovenox  Inject 0.9 mLs (135 mg total) into the skin every 12 (twelve) hours.   fluconazole  150 MG tablet Commonly known as: Diflucan  If develop yeast infection from antibiotic, take one pill once. Repeat 3 days later if symptoms not improved.   prenatal multivitamin Tabs tablet Take 1 tablet by mouth daily at 12 noon.       Martha Dixons, DO PGY-1 Family Medicine Resident [facility]

## 2024-02-26 LAB — CULTURE, OB URINE: Culture: NO GROWTH

## 2024-02-28 ENCOUNTER — Encounter: Payer: Self-pay | Admitting: Obstetrics and Gynecology

## 2024-02-28 DIAGNOSIS — O418X20 Other specified disorders of amniotic fluid and membranes, second trimester, not applicable or unspecified: Secondary | ICD-10-CM | POA: Insufficient documentation

## 2024-03-03 ENCOUNTER — Other Ambulatory Visit

## 2024-03-03 ENCOUNTER — Ambulatory Visit (INDEPENDENT_AMBULATORY_CARE_PROVIDER_SITE_OTHER): Admitting: Obstetrics & Gynecology

## 2024-03-03 VITALS — BP 113/80 | HR 92 | Wt 332.4 lb

## 2024-03-03 DIAGNOSIS — O0990 Supervision of high risk pregnancy, unspecified, unspecified trimester: Secondary | ICD-10-CM

## 2024-03-03 DIAGNOSIS — Z6791 Unspecified blood type, Rh negative: Secondary | ICD-10-CM

## 2024-03-03 DIAGNOSIS — O26899 Other specified pregnancy related conditions, unspecified trimester: Secondary | ICD-10-CM

## 2024-03-03 DIAGNOSIS — Q5128 Other doubling of uterus, other specified: Secondary | ICD-10-CM

## 2024-03-03 DIAGNOSIS — O4440 Low lying placenta NOS or without hemorrhage, unspecified trimester: Secondary | ICD-10-CM

## 2024-03-03 DIAGNOSIS — Z3A27 27 weeks gestation of pregnancy: Secondary | ICD-10-CM

## 2024-03-03 DIAGNOSIS — Z98891 History of uterine scar from previous surgery: Secondary | ICD-10-CM | POA: Diagnosis not present

## 2024-03-03 DIAGNOSIS — Z23 Encounter for immunization: Secondary | ICD-10-CM

## 2024-03-03 DIAGNOSIS — O34593 Maternal care for other abnormalities of gravid uterus, third trimester: Secondary | ICD-10-CM

## 2024-03-03 DIAGNOSIS — O418X20 Other specified disorders of amniotic fluid and membranes, second trimester, not applicable or unspecified: Secondary | ICD-10-CM

## 2024-03-03 NOTE — Progress Notes (Signed)
 PRENATAL VISIT NOTE  Subjective:  Martha Potts is a 33 y.o. G3P1011 at [redacted]w[redacted]d being seen today for ongoing prenatal care.  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  Patient reports scant brown discharge, has history of subchorionic hemorrhage. This is markedly decreased now.  Contractions: Irritability. Vag. Bleeding: Scant (nothing new since hospital visit; light brown in color).  Movement: Present. Denies leaking of fluid.   The following portions of the patient's history were reviewed and updated as appropriate: allergies, current medications, past family history, past medical history, past social history, past surgical history and problem list.   Objective:   Vitals:   03/03/24 0859  BP: 113/80  Pulse: 92  Weight: (!) 332 lb 6.4 oz (150.8 kg)    Fetal Status:  Fetal Heart Rate (bpm): 138   Movement: Present    General: Alert, oriented and cooperative. Patient is in no acute distress.  Skin: Skin is warm and dry. No rash noted.   Cardiovascular: Normal heart rate noted  Respiratory: Normal respiratory effort, no problems with respiration noted  Abdomen: Soft, gravid, appropriate for gestational age.  Pain/Pressure: Present     Pelvic: Cervical exam deferred        Extremities: Normal range of motion.     Mental Status: Normal mood and affect. Normal behavior. Normal judgment and thought content.      01/17/2024   11:29 AM  Depression screen PHQ 2/9  Decreased Interest 0  Down, Depressed,  Hopeless 0  PHQ - 2 Score 0         No data to display         US  MFM OB LIMITED Result Date: 02/25/2024 ----------------------------------------------------------------------  OBSTETRICS REPORT                       (Signed Final 02/25/2024 05:50 pm) ---------------------------------------------------------------------- Patient Info  ID #:       [id number]                          D.O.B.:  February 21, 1991 (33 yrs)(F)  Name:       Martha Potts               Visit Date: 02/25/2024 03:05 pm ---------------------------------------------------------------------- Performed By  Attending:        Steffan Keys MD         Ref. Address:     [address]                                                             [address]  Performed By:     Powell Breen BS       Secondary Phy.:   [facility] MAU/Triage                    RDMS  Referred By:  [facility]       Location[facility] ---------------------------------------------------------------------- Orders  #  Description                           Code        Ordered By  1  US  MFM OB LIMITED                     [id number]    CHARLIE PICKENS  2  US  MFM OB TRANSVAGINAL                [id number]     CHARLIE PICKENS ----------------------------------------------------------------------  #  Order #                     Accession #                Episode #  1  [id number]                   [id number]                 [id number]  2  [id number]                   [id number]                 [id number] ---------------------------------------------------------------------- Indications  Vaginal discharge during pregnancy in          O26.892  second trimester  Low lying placenta (resolved)                  O44.40  Obesity complicating pregnancy, second         O99.212  trimester (pregravid BMI 55)  Uterine abnormality during pregnancy           O34.599  (Didelphys- 1st preg in RT uterus)  Deep vein  thrombosis (DVT) (LT upper arm-      O22.30  Lovenox )  Abnormal chromosomal and genetic finding       O28.5  on antenatal screening of mother  (Insufficient fetal DNA)  Previous cesarean delivery                     O34.219  LR [facility] NIPS - Female  [redacted] weeks gestation of pregnancy                Z3A.26  Encounter for antenatal screening,             Z36.9  unspecified ---------------------------------------------------------------------- Fetal Evaluation  Num Of Fetuses:         1  Fetal Heart Rate(bpm):  141  Cardiac Activity:       Observed  Presentation:           Cephalic  Placenta:               Posterior  P. Cord Insertion:      Visualized  Amniotic Fluid  AFI FV:      Within normal limits  Largest Pocket(cm)                              4.8  Comment:    Small subchorionic hemorrhage noted 2.1 x 1.7 cm inferior              placental edge ---------------------------------------------------------------------- OB History  Blood Type:   A-  Gravidity:    2         Term:   1  Living:       1 ---------------------------------------------------------------------- Gestational Age  LMP:           28w 0d        Date:  08/13/23                  EDD:   05/19/24  Best:          nunzio 6d     Det. By:  Early Ultrasound         EDD:   05/27/24                                      (09/30/23) ---------------------------------------------------------------------- Anatomy  Cranium:               Appears normal         Stomach:                Appears normal, left                                                                        sided  Ventricles:            Appears normal         Kidneys:                Appear normal  Thoracic:              Appears normal         Bladder:                Appears normal  Diaphragm:             Appears normal ---------------------------------------------------------------------- Cervix Uterus Adnexa  Cervix  Length:              5  cm.  Measured transvaginally  Uterus   Bicornuate.  Right Ovary  Not visualized.  Left Ovary  Not visualized.  Cul De Sac  No free fluid seen.  Adnexa  No abnormality visualized ---------------------------------------------------------------------- Comments  This patient presented to the MAU due to dark brown vaginal  discharge.  She has a known low-lying placenta.  A limited ultrasound performed today shows that the fetus is  in the vertex presentation.  There was normal amniotic fluid noted.  A transvaginal ultrasound performed today showed a cervical  length of 5 cm long without any signs of funneling.  A low-lying placenta continues to be noted today.  There appears to be a small 2 cm subchorionic hematoma on  the inferior edge of the posterior placenta. ----------------------------------------------------------------------  Steffan Keys, MD Electronically Signed Final Report   02/25/2024 05:50 pm ----------------------------------------------------------------------   US  MFM OB Transvaginal Result Date: 02/25/2024 ----------------------------------------------------------------------  OBSTETRICS REPORT                       (Signed Final 02/25/2024 05:50 pm) ---------------------------------------------------------------------- Patient Info  ID #:       [id number]                          D.O.B.:  October 22, 1990 (33 yrs)(F)  Name:       Martha Potts               Visit Date: 02/25/2024 03:05 pm ---------------------------------------------------------------------- Performed By  Attending:        Steffan Keys MD         Ref. Address:     [address]                                                             [address]  Performed By:     Powell Breen BS       Secondary Phy.:   [facility] MAU/Triage                    RDMS  Referred By:      [facility]       [facility] ----------------------------------------------------------------------  Orders  #  Description                           Code        Ordered By  1  US  MFM OB LIMITED                     [id number]    CHARLIE PICKENS  2  US  MFM OB TRANSVAGINAL                [id number]     CHARLIE PICKENS ----------------------------------------------------------------------  #  Order #                     Accession #                Episode #  1  [id number]                   [id number]                 [id number]  2  [id number]                   [id number]                 [id number] ---------------------------------------------------------------------- Indications  Vaginal discharge during pregnancy in          O26.892  second trimester  Low lying placenta (resolved)                  O44.40  Obesity complicating pregnancy, second         O99.212  trimester (pregravid BMI 55)  Uterine abnormality during pregnancy           O34.599  (Didelphys- 1st preg in RT uterus)  Deep vein thrombosis (DVT) (LT upper arm-      O22.30  Lovenox )  Abnormal chromosomal and genetic finding       O28.5  on antenatal screening of mother  (Insufficient fetal DNA)  Previous cesarean delivery                     O34.219  LR Myriad NIPS - Female  [redacted] weeks gestation of pregnancy                Z3A.26  Encounter for antenatal screening,             Z36.9  unspecified ---------------------------------------------------------------------- Fetal Evaluation  Num Of Fetuses:         1  Fetal Heart Rate(bpm):  141  Cardiac Activity:       Observed  Presentation:           Cephalic  Placenta:               Posterior  P. Cord Insertion:      Visualized  Amniotic Fluid  AFI FV:      Within normal limits                              Largest Pocket(cm)                              4.8  Comment:    Small subchorionic hemorrhage noted 2.1 x 1.7 cm inferior              placental edge ---------------------------------------------------------------------- OB History  Blood Type:   A-  Gravidity:    2         Term:   1  Living:       1  ---------------------------------------------------------------------- Gestational Age  LMP:           28w 0d        Date:  08/13/23                  EDD:   05/19/24  Best:          26w 6d     Det. By:  Early Ultrasound         EDD:   05/27/24                                      (09/30/23) ---------------------------------------------------------------------- Anatomy  Cranium:               Appears normal         Stomach:                Appears normal, left  sided  Ventricles:            Appears normal         Kidneys:                Appear normal  Thoracic:              Appears normal         Bladder:                Appears normal  Diaphragm:             Appears normal ---------------------------------------------------------------------- Cervix Uterus Adnexa  Cervix  Length:              5  cm.  Measured transvaginally  Uterus  Bicornuate.  Right Ovary  Not visualized.  Left Ovary  Not visualized.  Cul De Sac  No free fluid seen.  Adnexa  No abnormality visualized ---------------------------------------------------------------------- Comments  This patient presented to the MAU due to dark brown vaginal  discharge.  She has a known low-lying placenta.  A limited ultrasound performed today shows that the fetus is  in the vertex presentation.  There was normal amniotic fluid noted.  A transvaginal ultrasound performed today showed a cervical  length of 5 cm long without any signs of funneling.  A low-lying placenta continues to be noted today.  There appears to be a small 2 cm subchorionic hematoma on  the inferior edge of the posterior placenta. ----------------------------------------------------------------------                   Steffan Keys, MD Electronically Signed Final Report   02/25/2024 05:50 pm ----------------------------------------------------------------------   US  MFM OB FOLLOW UP Result Date:  02/10/2024 ----------------------------------------------------------------------  OBSTETRICS REPORT                       (Signed Final 02/10/2024 02:50 pm) ---------------------------------------------------------------------- Patient Info  ID #:       [id number]                          D.O.B.:  Nov 25, 1990 (33 yrs)(F)  Name:       Martha Potts               Visit Date: 02/10/2024 08:18 am ---------------------------------------------------------------------- Performed By  Attending:        Steffan Keys MD         Ref. Address:     [address]                                                             [address]  Performed By:     Rumaldo Sharps RDMS      [facility]  Referred By:      [facility] ---------------------------------------------------------------------- Orders  #  Description                           Code        Ordered By  1  US  MFM OB FOLLOW UP                   [phone]    FREDIA FRESH ----------------------------------------------------------------------  #  Order #                     Accession #                Episode #  1  [id number]                   [id number]                 [id number] ---------------------------------------------------------------------- Indications  Low lying placenta                             O44.40  Obesity complicating pregnancy, second         O99.212  trimester (pregravid BMI 55)  Uterine abnormality during pregnancy           O34.599  (Didelphys- 1st preg in RT uterus)  Deep vein thrombosis (DVT) (LT upper arm-      O22.30  Lovenox )  Abnormal chromosomal and genetic finding       O28.5  on antenatal screening of mother  (Insufficient fetal DNA)  Previous cesarean delivery                     O34.219  LR Myriad NIPS - Female   Antenatal follow-up for nonvisualized fetal    Z36.2  anatomy  [redacted] weeks gestation of pregnancy                Z3A.24 ---------------------------------------------------------------------- Vital Signs  BP:          118/77 ---------------------------------------------------------------------- Fetal Evaluation  Num Of Fetuses:         1  Fetal Heart Rate(bpm):  141  Cardiac Activity:       Observed  Presentation:           Breech  Placenta:               Posterior, low-lying  P. Cord Insertion:      Not well visualized  Amniotic Fluid  AFI FV:      Within normal limits                              Largest Pocket(cm)                              4.97 ---------------------------------------------------------------------- Biometry  BPD:        62  mm     G. Age:  25w 1d         59  %    CI:        72.07   %    70 - 86                                                          FL/HC:      19.8   %    18.7 - 20.3  HC:      232.4  mm     G. Age:  25w 2d         50  %    HC/AC:      1.08        1.04 - 1.22  AC:      215.8  mm     G. Age:  26w 0d         81  %    FL/BPD:     74.0   %    71 - 87  FL:       45.9  mm     G. Age:  25w 2d         53  %    FL/AC:      21.3   %    20 - 24  LV:        3.8  mm  Est. FW:     834  gm    1 lb 13 oz      81  % ---------------------------------------------------------------------- OB History  Blood Type:   A-  Gravidity:    2         Term:   1  Living:       1 ---------------------------------------------------------------------- Gestational Age  LMP:           25w 6d        Date:  08/13/23                  EDD:   05/19/24  U/S Today:     25w 3d                                        EDD:   05/22/24  Best:          24w 5d     Det. By:  Early Ultrasound         EDD:   05/27/24                                      (09/30/23) ---------------------------------------------------------------------- Targeted Anatomy  Central Nervous System  Calvarium/Cranial V.:  Previously seen        Cereb./Vermis:           Previously seen  Cavum:                 Appears normal         Cisterna Magna:         Previously seen  Lateral Ventricles:    Appears normal         Midline Falx:  Previously seen  Choroid Plexus:        Previously seen  Spine  Cervical:              Not well visualized    Sacral:                 Not well visualized  Thoracic:              Not well visualized    Shape/Curvature:        Not well visualized  Lumbar:                Not well visualized  Head/Neck  Lips:                  Previously seen        Profile:                Previously seen  Neck:                  Previously seen        Orbits/Eyes:            Previously seen  Nuchal Fold:           Previously seen        Mandible:               Appears normal  Nasal Bone:            Previously seen        Maxilla:                Appears normal  Thorax  4 Chamber View:        Appears normal         Interventr. Septum:     Appears normal  Cardiac Rhythm:        Normal                 Cardiac Axis:           Previously seen  Cardiac Situs:         Previously seen        Diaphragm:              Not well visualized  Rt Outflow Tract:      Appears normal         3 Vessel View:          Appears normal  Lt Outflow Tract:      Not well visualized    3 V Trachea View:       Appears normal  Aortic Arch:           Not well visualized    IVC:                    Appears normal  Ductal Arch:           Not well visualized    Crossing:               Appears normal  SVC:                   Appears normal  Abdomen  Ventral Wall:          Previously seen        Lt Kidney:              Appears normal  Cord Insertion:        Previously seen  Rt Kidney:              Appears normal  Situs:                 Previously seen        Bladder:                Appears normal  Stomach:               Appears normal  Extremities  Lt Humerus:            Previously seen        Lt Femur:               Previously seen  Rt Humerus:            Previously seen        Rt Femur:                Previously seen  Lt Forearm:            Previously seen        Lt Lower Leg:           Previously seen  Rt Forearm:            Previously seen        Rt Lower Leg:           Previously seen  Lt Hand:               Previously seen        Lt Foot:                Nml foot  Rt Hand:               Previously seen        Rt Foot:                Nml foot  Other  Umbilical Cord:        Previously seen        Genitalia:              Female prev seen  Comment:     Technically difficult due to maternal habitus and fetal position. ---------------------------------------------------------------------- Cervix Uterus Adnexa  Cervix  Length:            3.8  cm.  Normal appearance by transabdominal scan.  Uterus  Uterus didelphys. Pregnancy within maternal left  uterus.  Right Ovary  Not visualized.  Left Ovary  Not visualized.  Cul De Sac  No free fluid seen.  Adnexa  No abnormality visualized ---------------------------------------------------------------------- Comments  Martha Potts is currently at 24 weeks and 5 days.  She  has been followed due to maternal obesity with a BMI of  55.15.  She also has a didelphys uterus.  She had a vaginal  septum removed following the delivery of her last child.  Her pregnancy has also been complicated by a right brachial  DVT after a difficult blood draw and IV placement.  She was  treated with Lovenox  for 2+ months.  She was seen by  hematology last month.  Her hematologist does not believe  that she requires any additional anticoagulation.  She denies any problems since her last exam.  A low-lying placenta was also noted during her last  ultrasound exam.  Sonographic findings  Single intrauterine pregnancy at 24w 5d.  Fetal cardiac activity:  Observed and appears normal.  Presentation: Breech.  Fetal  biometry shows the estimated fetal weight of 1 pound  13 ounces which measures at the 81st percentile.  Amniotic fluid volume: Within normal limits. MVP: 4.97 cm.  Placenta:  Posterior, low-lying.  There are limitations of prenatal ultrasound such as the  inability to detect certain abnormalities due to poor  visualization. Various factors such as fetal position,  gestational age and maternal body habitus may increase the  difficulty in visualizing the fetal anatomy.  She was advised that the posterior low-lying placenta  appears to be resolving.  We will continue to assess the  placental location during her future exams.  Due to maternal obesity, we will continue to follow her with  monthly growth scans.  Weekly fetal testing will be started at 32 weeks and continued  until delivery.  Delivery should occur at around 39 weeks.  As her DVT was probably the result of a difficult blood draw  and IV placement and as her hematologist does not  recommend any further anticoagulation, she will not require  any further anticoagulation during her pregnancy.  The increased risk of fetal malpresentation associated with a  didelphys uterus was discussed.  The fetus was in the breech  presentation today.  The patient has stated that due to  concerns regarding bleeding associated with her resected  vaginal septum, she will most likely elect to proceed with a  repeat cesarean delivery at around 39 weeks.  A follow-up growth scan was scheduled in 4 weeks.  The patient stated that all of her questions were answered  today.  A total of 20 minutes was spent counseling and coordinating  the care for this patient.  Greater than 50% of the time was  spent in direct face-to-face contact. ----------------------------------------------------------------------                   Steffan Keys, MD Electronically Signed Final Report   02/10/2024 02:50 pm ----------------------------------------------------------------------     Assessment and Plan:  Pregnancy: G3P1011 at [redacted]w[redacted]d 1. History of cesarean delivery 2. 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 Low-lying placenta  (resolved) Patient will be scheduled for RCS and BTS at 39 weeks. Medicaid BTS papers signed today.  4. Rh negative state in antepartum period Received Rhogam on 02/13/24. - Antibody screen  5. Subchorionic hematoma in second trimester, single or unspecified fetus Continue to monitor bleeding, precautions reviewed,  6. [redacted] weeks gestation of pregnancy 7. Supervision of high risk pregnancy, antepartum No other concerns.  Third trimester expectations reviewed and all questions answered. Preterm labor symptoms and general obstetric precautions including but not limited to vaginal bleeding, contractions, leaking of fluid and fetal movement were reviewed in detail with the patient. Please refer to After Visit Summary for other counseling recommendations.   Return in about 2 weeks (around 03/17/2024) for OFFICE OB VISIT (MD only).  Future Appointments  Date Time Provider Department Center  03/10/2024 11:15 AM [facility] PROVIDER 1 [facility]  03/10/2024 11:30 AM [facility] US2 [facility]  03/17/2024  1:30 PM Gerline Ratto, Gloris LABOR, MD [facility]  04/06/2024  9:15 AM [facility] PROVIDER 1 [facility]  04/06/2024  9:30 AM [facility] US1 [facility]  04/14/2024  1:10 PM [facility] NST [facility]  04/21/2024  1:10 PM [facility] NST [facility]    Gloris Hugger, MD

## 2024-03-03 NOTE — Patient Instructions (Signed)
 RSV Vaccination for Pregnant People  CDC recommends two ways to protect babies from getting very sick with Respiratory Syncytial Virus (RSV):  An RSV vaccination given during pregnancy  Pfizer's vaccine Verdis Frederickson) is recommended for use during pregnancy. It is given during RSV season to people who are 32 through [redacted] weeks pregnant.  Or, An RSV immunization given directly to infants and some older babies  Babies born to mothers who get RSV vaccine at least 2 weeks before delivery will have protection and, in most cases, should not need an RSV immunization later.    When is RSV season?  In most regions of the [place] RSV season starts in the fall and peaks in the winter, but the timing and severity of RSV season can vary from place to place and year to year.   The goal of maternal RSV vaccination is to protect babies from getting very sick with RSV during their first RSV season.  In most of the [place], this means maternal RSV vaccine will be given in September through January.  Who should get the maternal RSV vaccine?  People who are 85 through [redacted] weeks pregnant during September through January should get one dose of maternal RSV vaccine to protect their babies. RSV season can vary around the country.   How is the maternal RSV vaccine administered?  Maternal RSV vaccine is given as a shot into the mother's upper arm. Only a single dose (one shot) of maternal RSV vaccine is recommended.   It is not yet known whether another dose might be needed in later pregnancies.  How well does the maternal RSV vaccine work?  When someone gets RSV vaccine, their body responds by making a protein that protects against the virus that causes RSV. The process takes about 2 weeks. When a pregnant person gets RSV vaccine, their protective proteins (called antibodies) also pass to their baby. So, babies who are born at least 2 weeks after their mother gets RSV vaccine are protected  at birth, when infants are at the highest risk of severe RSV disease.   The vaccine can reduce a baby's risk of being hospitalized from RSV by 57% in the first six months after birth.  What are the possible side effects of the maternal RSV vaccine?  In the clinical trials, the side effects most often reported by pregnant people who received the maternal RSV vaccine were pain at the injection site, headache, muscle pain, and nausea.  Although not common, a dangerous high blood pressure condition called pre-eclampsia occurred in 1.8% of pregnant people who received the maternal RSV vaccine compared to 1.4% of pregnant people who received a placebo.  The clinical trials identified a small increase in the number of preterm births in vaccinated pregnant people. It is not clear if this is a true safety problem related to RSV vaccine or if this occurred for reasons unrelated to vaccination.  To reduce the potential risk of preterm birth and complications from RSV disease, FDA approved the maternal RSV vaccine for use during weeks 32 through 84 of pregnancy while additional studies are conducted.  FDA is requiring the manufacturer to do additional studies that will look more closely at the potential risk of preterm births and pregnancy-related high blood pressure issues in mothers, including pre-eclampsia.  Severe allergic reactions to vaccines are rare but can happen after any vaccine and can be life-threatening. If you see signs of a severe allergic reaction after vaccination (hives, swelling of the face  and throat, difficulty breathing, a fast heartbeat, dizziness, or weakness), seek immediate medical care by calling 911.  As with any medicine or vaccine there is a very remote chance of the vaccine causing other serious injury or death after vaccination.  Adverse events following vaccination should be reported to the Vaccine Adverse Event Reporting System (VAERS), even if it's not clear that the vaccine  caused the adverse event. You or your doctor can report an adverse event to [facility] and FDA through VAERS. If you need further assistance reporting to VAERS, please email info@[email or web address] [email or web address] or call 5067167565.  If you have any questions about side effects from the maternal RSV vaccine, talk with your healthcare provider.  Do I need a prescription for a maternal RSV vaccine?  Until the vaccine available in the office, you will need a prescription to take to a local pharmacy that is providing the vaccine.   How do I pay for the maternal RSV vaccine?  Most private health insurance plans cover the maternal RSV vaccine, but there may be a cost to you depending on your plan.  Contact your insurer to find out.  Medicaid Beginning January 28, 2022, most people with coverage from [facility] and [employer] [facility]) will be guaranteed coverage of all vaccines recommended by the Advisory Committee on Immunization Practice at no cost to them.   Source: [facility] for Immunization and Respiratory Diseases

## 2024-03-03 NOTE — Progress Notes (Signed)
 Patient states that she is still having some light brown scant bleeding that is not new from her MAU  visit.

## 2024-03-06 LAB — CBC
Hematocrit: 34.6 % (ref 34.0–46.6)
Hemoglobin: 11.5 g/dL (ref 11.1–15.9)
MCH: 30.7 pg (ref 26.6–33.0)
MCHC: 33.2 g/dL (ref 31.5–35.7)
MCV: 92 fL (ref 79–97)
Platelets: 392 x10E3/uL (ref 150–450)
RBC: 3.75 x10E6/uL — ABNORMAL LOW (ref 3.77–5.28)
RDW: 13.2 % (ref 11.7–15.4)
WBC: 17.7 x10E3/uL — ABNORMAL HIGH (ref 3.4–10.8)

## 2024-03-06 LAB — GLUCOSE TOLERANCE, 2 HOURS W/ 1HR
Glucose, 1 hour: 201 mg/dL — ABNORMAL HIGH (ref 70–179)
Glucose, 2 hour: 124 mg/dL (ref 70–152)
Glucose, Fasting: 92 mg/dL — ABNORMAL HIGH (ref 70–91)

## 2024-03-06 LAB — AB SCR+ANTIBODY ID

## 2024-03-06 LAB — ANTIBODY SCREEN

## 2024-03-06 LAB — HIV ANTIBODY (ROUTINE TESTING W REFLEX): HIV Screen 4th Generation wRfx: NONREACTIVE

## 2024-03-06 LAB — RPR: RPR Ser Ql: NONREACTIVE

## 2024-03-09 DIAGNOSIS — O24410 Gestational diabetes mellitus in pregnancy, diet controlled: Secondary | ICD-10-CM | POA: Insufficient documentation

## 2024-03-10 ENCOUNTER — Ambulatory Visit (HOSPITAL_BASED_OUTPATIENT_CLINIC_OR_DEPARTMENT_OTHER)

## 2024-03-10 ENCOUNTER — Ambulatory Visit: Payer: Self-pay | Admitting: Obstetrics & Gynecology

## 2024-03-10 ENCOUNTER — Ambulatory Visit: Attending: Maternal & Fetal Medicine | Admitting: Obstetrics

## 2024-03-10 ENCOUNTER — Other Ambulatory Visit: Payer: Self-pay | Admitting: *Deleted

## 2024-03-10 VITALS — BP 120/75

## 2024-03-10 DIAGNOSIS — Z86718 Personal history of other venous thrombosis and embolism: Secondary | ICD-10-CM

## 2024-03-10 DIAGNOSIS — E669 Obesity, unspecified: Secondary | ICD-10-CM | POA: Diagnosis not present

## 2024-03-10 DIAGNOSIS — O360130 Maternal care for anti-D [Rh] antibodies, third trimester, not applicable or unspecified: Secondary | ICD-10-CM | POA: Insufficient documentation

## 2024-03-10 DIAGNOSIS — O24419 Gestational diabetes mellitus in pregnancy, unspecified control: Secondary | ICD-10-CM

## 2024-03-10 DIAGNOSIS — Q5128 Other doubling of uterus, other specified: Secondary | ICD-10-CM

## 2024-03-10 DIAGNOSIS — O34593 Maternal care for other abnormalities of gravid uterus, third trimester: Secondary | ICD-10-CM | POA: Diagnosis not present

## 2024-03-10 DIAGNOSIS — O4440 Low lying placenta NOS or without hemorrhage, unspecified trimester: Secondary | ICD-10-CM

## 2024-03-10 DIAGNOSIS — O99213 Obesity complicating pregnancy, third trimester: Secondary | ICD-10-CM | POA: Insufficient documentation

## 2024-03-10 DIAGNOSIS — O0990 Supervision of high risk pregnancy, unspecified, unspecified trimester: Secondary | ICD-10-CM

## 2024-03-10 DIAGNOSIS — O34219 Maternal care for unspecified type scar from previous cesarean delivery: Secondary | ICD-10-CM | POA: Insufficient documentation

## 2024-03-10 DIAGNOSIS — O4443 Low lying placenta NOS or without hemorrhage, third trimester: Secondary | ICD-10-CM

## 2024-03-10 DIAGNOSIS — O24410 Gestational diabetes mellitus in pregnancy, diet controlled: Secondary | ICD-10-CM

## 2024-03-10 DIAGNOSIS — Z3A28 28 weeks gestation of pregnancy: Secondary | ICD-10-CM

## 2024-03-10 DIAGNOSIS — Z7901 Long term (current) use of anticoagulants: Secondary | ICD-10-CM | POA: Insufficient documentation

## 2024-03-10 DIAGNOSIS — Z362 Encounter for other antenatal screening follow-up: Secondary | ICD-10-CM | POA: Diagnosis present

## 2024-03-10 DIAGNOSIS — O285 Abnormal chromosomal and genetic finding on antenatal screening of mother: Secondary | ICD-10-CM | POA: Insufficient documentation

## 2024-03-10 DIAGNOSIS — O99210 Obesity complicating pregnancy, unspecified trimester: Secondary | ICD-10-CM

## 2024-03-10 DIAGNOSIS — O2233 Deep phlebothrombosis in pregnancy, third trimester: Secondary | ICD-10-CM | POA: Diagnosis not present

## 2024-03-10 MED ORDER — ACCU-CHEK GUIDE TEST VI STRP: 1.0000 | ORAL_STRIP | Freq: Four times a day (QID) | 12 refills | Status: DC

## 2024-03-10 MED ORDER — ACCU-CHEK SOFTCLIX LANCETS MISC: 12 refills | Status: DC

## 2024-03-10 MED ORDER — ACCU-CHEK GUIDE W/DEVICE KIT: 1.0000 | PACK | Freq: Four times a day (QID) | 0 refills | Status: DC

## 2024-03-10 NOTE — Progress Notes (Signed)
   Patient information  Patient Name: Martha Potts  Patient MRN:   [id number]  Referring practice: MFM Referring Provider: [facility]  Problem List   Patient Active Problem List   Diagnosis Date Noted   Gestational diabetes, diet controlled 03/09/2024   Subchorionic hematoma in second trimester 02/28/2024   Supervision of high risk pregnancy, antepartum 01/13/2024   BMI 50.0-59.9, adult (HCC) 01/13/2024   Anticoagulated 01/13/2024   Low-lying placenta 01/13/2024   Rh negative state in antepartum period 01/13/2024   History of cesarean delivery 01/13/2024   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 G3P1011 at [redacted]w[redacted]d here for ultrasound and consultation. Martha Potts is here for her US  and consult.  The patient reports very debilitating pelvic pain that is started this pregnancy.  She reports that she knows she has gained approximately 120 pound since her previous pregnancy and that may be contributing to some of her symptoms but she does feel like something is misaligned near her pubic bone.  I discussed the possibility of pubic symphysis dysfunction.  I encouraged her to see a chiropractor or physical therapist.  The patient reports that she was told she does not need anticoagulation since her DVT was traumatic and the inciting event is no longer present.  The patient had time to ask questions that were answered to her satisfaction.  She verbalized understanding and agrees to proceed with the plan below.  Recommendations -Encouraged the patient to seek PT or chiropractic support -Serial growth ultrasounds every 4-6 weeks until delivery -Antenatal testing to start around 32 weeks    Review of Systems: A review of systems was performed and was negative except per HPI   Vitals and Physical Exam    03/10/2024   11:21 AM 03/03/2024    8:59 AM 02/25/2024    4:35 PM  Vitals with BMI  Weight  332 lbs 6 oz   BMI  55.31   Systolic 120 113 875  Diastolic 75 80 72  Pulse  92 111  Sitting comfortably on the sonogram table Nonlabored breathing Normal rate and rhythm Abdomen is nontender  Past pregnancies OB History  Gravida Para Term Preterm AB Living  3 1 1  1 1   SAB IAB Ectopic Multiple Live Births  1        # Outcome Date GA Lbr Len/2nd Weight Sex Type Anes PTL Lv  3 Current           2 SAB 2017          1 Term 05/31/11    M [id number]        I spent 30 minutes reviewing the patients chart, including labs and images as well as counseling the patient about her medical conditions. Greater than 50% of the time was spent in direct face-to-face patient counseling.  [name]  MFM, [facility]   03/10/2024  12:03 PM

## 2024-03-11 ENCOUNTER — Encounter: Attending: Obstetrics & Gynecology | Admitting: Dietician

## 2024-03-11 DIAGNOSIS — O24419 Gestational diabetes mellitus in pregnancy, unspecified control: Secondary | ICD-10-CM | POA: Insufficient documentation

## 2024-03-11 NOTE — Progress Notes (Signed)
 Patient was seen on 03/11/2024 for Gestational Diabetes self-management class at the [facility]. The following learning objectives were met by the patient during this group class of 6. Class start: 0905 Class end: 1050  States the definition of Gestational Diabetes States why dietary management is important in controlling blood glucose Describes the effects each nutrient has on blood glucose levels Demonstrates ability to create a balanced meal plan Demonstrates carbohydrate counting  States when to check blood glucose levels Demonstrates proper blood glucose monitoring techniques States the effect of stress and exercise on blood glucose levels States the importance of limiting caffeine  and abstaining from alcohol and smoking  Accu Chek - Patient has a meter prior to visit. Patient is instructed to begin testing pre breakfast and 2 hours after each meal. Blood glucose today in class 96 mg/dL, reported as fasting per Pt  Patient instructed to monitor glucose levels:  QID FBS: 60 - <95 1 hour: <140 2 hour: <120  Patient received handouts:  Nutrition Diabetes and Pregnancy Carbohydrate Counting List Blood glucose log Snack ideas for diabetes during pregnancy Plate Planner  Patient will be seen for follow-up as needed.

## 2024-03-17 ENCOUNTER — Encounter: Payer: Self-pay | Admitting: Obstetrics & Gynecology

## 2024-03-17 ENCOUNTER — Ambulatory Visit (INDEPENDENT_AMBULATORY_CARE_PROVIDER_SITE_OTHER): Admitting: Obstetrics & Gynecology

## 2024-03-17 VITALS — BP 132/92 | HR 80 | Wt 331.0 lb

## 2024-03-17 DIAGNOSIS — Z86718 Personal history of other venous thrombosis and embolism: Secondary | ICD-10-CM

## 2024-03-17 DIAGNOSIS — O24410 Gestational diabetes mellitus in pregnancy, diet controlled: Secondary | ICD-10-CM | POA: Diagnosis not present

## 2024-03-17 DIAGNOSIS — O0990 Supervision of high risk pregnancy, unspecified, unspecified trimester: Secondary | ICD-10-CM

## 2024-03-17 DIAGNOSIS — Z23 Encounter for immunization: Secondary | ICD-10-CM

## 2024-03-17 DIAGNOSIS — Z3A29 29 weeks gestation of pregnancy: Secondary | ICD-10-CM

## 2024-03-17 DIAGNOSIS — Z6791 Unspecified blood type, Rh negative: Secondary | ICD-10-CM

## 2024-03-17 DIAGNOSIS — Z98891 History of uterine scar from previous surgery: Secondary | ICD-10-CM

## 2024-03-17 DIAGNOSIS — O26899 Other specified pregnancy related conditions, unspecified trimester: Secondary | ICD-10-CM

## 2024-03-17 DIAGNOSIS — O4440 Low lying placenta NOS or without hemorrhage, unspecified trimester: Secondary | ICD-10-CM

## 2024-03-17 DIAGNOSIS — R030 Elevated blood-pressure reading, without diagnosis of hypertension: Secondary | ICD-10-CM

## 2024-03-17 DIAGNOSIS — O99210 Obesity complicating pregnancy, unspecified trimester: Secondary | ICD-10-CM | POA: Diagnosis not present

## 2024-03-17 NOTE — Progress Notes (Signed)
 PRENATAL VISIT NOTE  Subjective:  Martha Potts is a 33 y.o. G3P1011 at [redacted]w[redacted]d being seen today for ongoing prenatal care.  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  Patient reports no complaints.  Contractions: Irritability. Vag. Bleeding: None.  Movement: Present. Denies leaking of fluid.  Patient denies any headaches, visual symptoms, RUQ/epigastric pain or other concerning symptoms.   The following portions of the patient's history were reviewed and updated as appropriate: allergies, current medications, past family history, past medical history, past social history, past surgical history and problem list.   Objective:   Vitals:   03/17/24 1337 03/17/24 1416  BP: (!) 139/91 (!) 132/92  Pulse: 80   Weight: (!) 331 lb (150.1 kg)     Fetal Status:  Fetal Heart Rate (bpm): 146   Movement: Present    General: Alert, oriented and cooperative. Patient is in no acute distress.  Skin: Skin is warm and dry. No rash noted.   Cardiovascular: Normal heart rate noted  Respiratory: Normal respiratory effort, no problems with respiration noted  Abdomen: Soft, gravid, appropriate for gestational age.  Pain/Pressure: Present     Pelvic: Cervical exam deferred        Extremities: Normal range of motion.  Edema: Trace  Mental Status: Normal mood and affect. Normal behavior. Normal judgment and thought content.      01/17/2024   11:29 AM  Depression screen PHQ 2/9   Decreased Interest 0  Down, Depressed, Hopeless 0  PHQ - 2 Score 0         No data to display          Assessment and Plan:  Pregnancy: G3P1011 at [redacted]w[redacted]d 1. Elevated blood pressure reading This is her first instance of elevated BP, DBP slightly elevated.  No symptoms.  Will check labs.  Continue ldASA. Will recheck BP next week. If still elevated, will meet criteria for GHTN vs preeclampsia and will need weekly antenatal testing starting the following week (32 weeks). Discussed all this with patient, preeclampsia precautions reviewed.  Discussed that if she is diagnosed with any hypertensive disorder, this will impact timing of delivery. - CBCs - Comprehensive metabolic panel with GFR - Protein / creatinine ratio, urine  2. Diet controlled gestational diabetes mellitus (GDM) in third trimester (Primary) Had issues with insurance, did not get supplies until a few days ago.  Reviewed the few blood sugars, fastings 80-90s, one was 110.  Normal postprandials. Need to evaluate more values, will check again next visit. Continue diet and exercise control for now.   3. History of DVT (deep vein thrombosis) Completed Lovenox  in August 2025 as per Heme recommendations.  4. Maternal morbid obesity, antepartum (HCC) TWG -6.4 oz, current BMI 55.  5. Low-lying placenta  Resolved on recent scan.  6. History of cesarean delivery Desires RCS+BTS, timing to be determined  7. Rh negative state in antepartum period Already received Rhogam.  8. Flu vaccine need - Flu vaccine trivalent PF, 6mos and older(Flulaval,Afluria,Fluarix,Fluzone) given today.  9. [redacted] weeks gestation of pregnancy 10. Supervision of high risk pregnancy, antepartum No other  concerns.  Third trimester expectations reviewed and all questions answered. Preterm labor symptoms and general obstetric precautions including but not limited to vaginal bleeding, contractions, leaking of fluid and fetal movement were reviewed in  detail with the patient. Please refer to After Visit Summary for other counseling recommendations.   Return in about 2 weeks (around 03/31/2024) for OFFICE OB VISIT (MD only)   Needs RN visit for BP check  in one week.  Future Appointments  Date Time Provider Department Center  04/06/2024  9:15 AM [facility] PROVIDER 1 [facility]  04/06/2024  9:30 AM [facility] US1 [facility]  04/14/2024  1:10 PM [facility] NST [facility]  04/21/2024  1:10 PM [facility] NST [facility]    Gloris Hugger, MD

## 2024-03-18 LAB — CBC
Hematocrit: 36.8 % (ref 34.0–46.6)
Hemoglobin: 12.3 g/dL (ref 11.1–15.9)
MCH: 30.1 pg (ref 26.6–33.0)
MCHC: 33.4 g/dL (ref 31.5–35.7)
MCV: 90 fL (ref 79–97)
Platelets: 448 x10E3/uL (ref 150–450)
RBC: 4.09 x10E6/uL (ref 3.77–5.28)
RDW: 12.8 % (ref 11.7–15.4)
WBC: 16.9 x10E3/uL — ABNORMAL HIGH (ref 3.4–10.8)

## 2024-03-18 LAB — COMPREHENSIVE METABOLIC PANEL WITH GFR
ALT: 13 IU/L (ref 0–32)
AST: 19 IU/L (ref 0–40)
Albumin: 4 g/dL (ref 3.9–4.9)
Alkaline Phosphatase: 165 IU/L — ABNORMAL HIGH (ref 41–116)
BUN/Creatinine Ratio: 17 (ref 9–23)
BUN: 8 mg/dL (ref 6–20)
Bilirubin Total: 0.3 mg/dL (ref 0.0–1.2)
CO2: 20 mmol/L (ref 20–29)
Calcium: 9.8 mg/dL (ref 8.7–10.2)
Chloride: 101 mmol/L (ref 96–106)
Creatinine, Ser: 0.47 mg/dL — ABNORMAL LOW (ref 0.57–1.00)
Globulin, Total: 2.6 g/dL (ref 1.5–4.5)
Glucose: 100 mg/dL — ABNORMAL HIGH (ref 70–99)
Potassium: 4.4 mmol/L (ref 3.5–5.2)
Sodium: 136 mmol/L (ref 134–144)
Total Protein: 6.6 g/dL (ref 6.0–8.5)
eGFR: 130 mL/min/1.73 (ref >59)

## 2024-03-18 LAB — PROTEIN / CREATININE RATIO, URINE
Creatinine, Urine: 144.2 mg/dL
Protein, Ur: 21 mg/dL
Protein/Creat Ratio: 146 mg/g{creat} (ref 0–200)

## 2024-03-19 ENCOUNTER — Ambulatory Visit: Payer: Self-pay | Admitting: Obstetrics & Gynecology

## 2024-03-24 ENCOUNTER — Ambulatory Visit: Admitting: *Deleted

## 2024-03-24 VITALS — BP 118/81

## 2024-03-24 DIAGNOSIS — O0993 Supervision of high risk pregnancy, unspecified, third trimester: Secondary | ICD-10-CM

## 2024-03-24 DIAGNOSIS — O0990 Supervision of high risk pregnancy, unspecified, unspecified trimester: Secondary | ICD-10-CM

## 2024-03-30 NOTE — Progress Notes (Unsigned)
 Subjective:  Martha Potts is a 33 y.o. female here for BP check.  Hypertension ROS: no TIA's, no chest pain on exertion, no dyspnea on exertion, and no swelling of ankles.   Objective:  BP 118/81   LMP 08/13/2023   Appearance alert, well appearing, and in no distress.  Assessment:   Blood Pressure today in office stable.   Plan:  Follow up as needed

## 2024-04-02 ENCOUNTER — Inpatient Hospital Stay (HOSPITAL_COMMUNITY)
Admission: AD | Admit: 2024-04-02 | Discharge: 2024-04-02 | Disposition: A | Discharge status: Home / Self Care | Attending: Obstetrics & Gynecology | Admitting: Obstetrics & Gynecology

## 2024-04-02 ENCOUNTER — Encounter (HOSPITAL_COMMUNITY): Payer: Self-pay | Admitting: Obstetrics & Gynecology

## 2024-04-02 DIAGNOSIS — O99891 Other specified diseases and conditions complicating pregnancy: Secondary | ICD-10-CM

## 2024-04-02 DIAGNOSIS — Z3A32 32 weeks gestation of pregnancy: Secondary | ICD-10-CM | POA: Diagnosis not present

## 2024-04-02 DIAGNOSIS — M549 Dorsalgia, unspecified: Secondary | ICD-10-CM | POA: Diagnosis not present

## 2024-04-02 DIAGNOSIS — R300 Dysuria: Secondary | ICD-10-CM

## 2024-04-02 LAB — COMPREHENSIVE METABOLIC PANEL WITH GFR
ALT: 16 U/L (ref 0–44)
AST: 20 U/L (ref 15–41)
Albumin: 2.6 g/dL — ABNORMAL LOW (ref 3.5–5.0)
Alkaline Phosphatase: 121 U/L (ref 38–126)
Anion gap: 11 (ref 5–15)
BUN: 6 mg/dL (ref 6–20)
CO2: 18 mmol/L — ABNORMAL LOW (ref 22–32)
Calcium: 9 mg/dL (ref 8.9–10.3)
Chloride: 106 mmol/L (ref 98–111)
Creatinine, Ser: 0.54 mg/dL (ref 0.44–1.00)
GFR, Estimated: >60 mL/min (ref >60)
Glucose, Bld: 109 mg/dL — ABNORMAL HIGH (ref 70–99)
Potassium: 4 mmol/L (ref 3.5–5.1)
Sodium: 135 mmol/L (ref 135–145)
Total Bilirubin: 0.3 mg/dL (ref 0.0–1.2)
Total Protein: 6.4 g/dL — ABNORMAL LOW (ref 6.5–8.1)

## 2024-04-02 LAB — CBC
HCT: 33.6 % — ABNORMAL LOW (ref 36.0–46.0)
Hemoglobin: 11.6 g/dL — ABNORMAL LOW (ref 12.0–15.0)
MCH: 30.2 pg (ref 26.0–34.0)
MCHC: 34.5 g/dL (ref 30.0–36.0)
MCV: 87.5 fL (ref 80.0–100.0)
Platelets: 417 K/uL — ABNORMAL HIGH (ref 150–400)
RBC: 3.84 MIL/uL — ABNORMAL LOW (ref 3.87–5.11)
RDW: 13.4 % (ref 11.5–15.5)
WBC: 18.6 K/uL — ABNORMAL HIGH (ref 4.0–10.5)
nRBC: 0 % (ref 0.0–0.2)

## 2024-04-02 LAB — URINALYSIS, ROUTINE W REFLEX MICROSCOPIC
Bilirubin Urine: NEGATIVE
Glucose, UA: NEGATIVE mg/dL
Hgb urine dipstick: NEGATIVE
Ketones, ur: NEGATIVE mg/dL
Leukocytes,Ua: NEGATIVE
Nitrite: NEGATIVE
Protein, ur: NEGATIVE mg/dL
Specific Gravity, Urine: 1.01 (ref 1.005–1.030)
pH: 6 (ref 5.0–8.0)

## 2024-04-02 LAB — GC/CHLAMYDIA PROBE AMP (~~LOC~~) NOT AT ARMC
Chlamydia: NEGATIVE
Comment: NEGATIVE
Comment: NORMAL
Neisseria Gonorrhea: NEGATIVE

## 2024-04-02 LAB — PROTEIN / CREATININE RATIO, URINE
Creatinine, Urine: 63 mg/dL
Total Protein, Urine: <6 mg/dL

## 2024-04-02 LAB — POCT FERN TEST: POCT Fern Test: NEGATIVE

## 2024-04-02 LAB — WET PREP, GENITAL
Clue Cells Wet Prep HPF POC: NONE SEEN
Sperm: NONE SEEN
Trich, Wet Prep: NONE SEEN
WBC, Wet Prep HPF POC: >=10 — AB (ref <10)
Yeast Wet Prep HPF POC: NONE SEEN

## 2024-04-02 MED ORDER — ACETAMINOPHEN 500 MG PO TABS
1000.0000 mg | ORAL_TABLET | Freq: Once | ORAL | Status: AC
  Administered 2024-04-02: 1000 mg via ORAL
  Filled 2024-04-02: qty 2

## 2024-04-02 MED ORDER — CYCLOBENZAPRINE HCL 5 MG PO TABS
5.0000 mg | ORAL_TABLET | ORAL | Status: AC
  Administered 2024-04-02: 5 mg via ORAL
  Filled 2024-04-02: qty 1

## 2024-04-02 NOTE — MAU Provider Note (Signed)
 History     CSN: [id number]  Arrival date and time: 04/02/24 0146 First Provider Initiated Contact with Patient   Chief Complaint  Patient presents with   Back Pain   Abdominal Pain   Urinary Frequency    HPI Martha Potts is a 33 y.o. G3P1011 at [redacted]w[redacted]d, 05/27/2024, by Ultrasound, who presents to the Maternity Assessment Unit for multiple complaints.   Patient reports frontal HA, rated 4/10, with black spots in her vision. The HA is intermittent but has occurred daily this week.  She endorses lower abdominal pain that may be BH ctx, but she is unsure. She also endorses back pain. She has been overall very uncomfortable for weeks, tearful, feeling the pain is taking a toll on her mental health. She has a maternity belt, Tylenol , and Flexeril  10mg  at home.   Regarding urinary frequency, patient reports burning, even at rest and before micturition. She feels generally uncomfortable in the vulva, states she has always been sensitive. Denies any new bath/hygiene products. Denies f/c, congestion, cough, sick symptoms to suggest URI. Denies bodyaches, states each pain feels distinct from other.  ROS Contractions: Yes  Fetal Movement: Yes  Vaginal bleeding: No  LOF: Yes    Past Medical History:  Diagnosis Date   Acute cholecystitis due to biliary calculus 04/21/2016   Deep vein phlebitis of upper extremity 09/2023   L arm   Family discord 10/05/2018   Polyp of sigmoid colon    Rectal bleeding 01/21/2023   Uterus didelphus    Past Surgical History:  Procedure Laterality Date   CERVIX SURGERY     CESAREAN SECTION     CHOLECYSTECTOMY N/A 04/22/2016   Procedure: LAPAROSCOPIC CHOLECYSTECTOMY;  Surgeon: Carlin Pastel, MD;  Location: [facility] ORS;  Service: General;  Laterality: N/A;   COLONOSCOPY WITH PROPOFOL  N/A 08/12/2018   Procedure: COLONOSCOPY WITH PROPOFOL ;  Surgeon: [name], MD;  Location: [facility] ENDOSCOPY;  Service: Endoscopy;  Laterality: N/A;  Urgent per Dr.  Janalyn   COLONOSCOPY WITH PROPOFOL  N/A 01/21/2023   Procedure: COLONOSCOPY WITH PROPOFOL ;  Surgeon: Unk [name], MD;  Location[facility] ENDOSCOPY;  Service: Gastroenterology;  Laterality: N/A;   No Known Allergies  Physical Exam  BP 131/87   Pulse 99   Temp (!) 97.5 F (36.4 C) (Oral)   Resp 17   Ht 5' 5 (1.651 m)   Wt (!) 150.4 kg   LMP 08/13/2023   SpO2 100%   BMI 55.16 kg/m   Gen: alert, no acute distress CV: regular rate Resp: nonlabored Abd: tender to palpation at the fundus but not over lower quadrants, gravid GU: SCE very uncomfortable. Mild erythema noted, but no obvious rash, excoriation, or abrasion.   Cervical Exam Dilation: Closed Exam by:: Danny, MD  FHT Baseline: 130 bpm Variability: Good {> 6 bpm) Accelerations: Reactive Decelerations: Absent Uterine activity: None  Labs --/--/A NEG (10/16 2306)   Results for orders placed or performed during the hospital encounter of 04/02/24 (from the past 24 hours)  CBC     Status: Abnormal   Collection Time: 04/02/24  2:06 AM  Result Value Ref Range   WBC 18.6 (H) 4.0 - 10.5 K/uL   RBC 3.84 (L) 3.87 - 5.11 MIL/uL   Hemoglobin 11.6 (L) 12.0 - 15.0 g/dL   HCT 66.3 (L) 63.9 - 53.9 %   MCV 87.5 80.0 - 100.0 fL   MCH 30.2 26.0 - 34.0 pg   MCHC 34.5 30.0 - 36.0 g/dL   RDW 13.4  11.5 - 15.5 %   Platelets 417 (H) 150 - 400 K/uL   nRBC 0.0 0.0 - 0.2 %  Comprehensive metabolic panel with GFR     Status: Abnormal   Collection Time: 04/02/24  2:06 AM  Result Value Ref Range   Sodium 135 135 - 145 mmol/L   Potassium 4.0 3.5 - 5.1 mmol/L   Chloride 106 98 - 111 mmol/L   CO2 18 (L) 22 - 32 mmol/L   Glucose, Bld 109 (H) 70 - 99 mg/dL   BUN 6 6 - 20 mg/dL   Creatinine, Ser 9.45 0.44 - 1.00 mg/dL   Calcium 9.0 8.9 - 89.6 mg/dL   Total Protein 6.4 (L) 6.5 - 8.1 g/dL   Albumin 2.6 (L) 3.5 - 5.0 g/dL   AST 20 15 - 41 U/L   ALT 16 0 - 44 U/L   Alkaline Phosphatase 121 38 - 126 U/L   Total Bilirubin 0.3 0.0 - 1.2  mg/dL   GFR, Estimated >39 >39 mL/min   Anion gap 11 5 - 15  Protein / creatinine ratio, urine     Status: None   Collection Time: 04/02/24  2:27 AM  Result Value Ref Range   Creatinine, Urine 63 mg/dL   Total Protein, Urine <6 mg/dL   Protein Creatinine Ratio        0.00 - 0.15 mg/mg[Cre]  Urinalysis, Routine w reflex microscopic -Urine, Clean Catch     Status: Abnormal   Collection Time: 04/02/24  2:27 AM  Result Value Ref Range   Color, Urine YELLOW YELLOW   APPearance CLEAR CLEAR   Specific Gravity, Urine 1.010 1.005 - 1.030   pH 6.0 5.0 - 8.0   Glucose, UA NEGATIVE NEGATIVE mg/dL   Hgb urine dipstick NEGATIVE NEGATIVE   Bilirubin Urine NEGATIVE NEGATIVE   Ketones, ur NEGATIVE NEGATIVE mg/dL   Protein, ur NEGATIVE NEGATIVE mg/dL   Nitrite NEGATIVE NEGATIVE   Leukocytes,Ua NEGATIVE NEGATIVE   RBC / HPF 0-5 0 - 5 RBC/hpf   WBC, UA 0-5 0 - 5 WBC/hpf   Bacteria, UA RARE (A) NONE SEEN   Squamous Epithelial / HPF 0-5 0 - 5 /HPF   Mucus PRESENT   Wet prep, genital     Status: Abnormal   Collection Time: 04/02/24  5:33 AM   Specimen: PATH Cytology Cervicovaginal Ancillary Only  Result Value Ref Range   Yeast Wet Prep HPF POC NONE SEEN NONE SEEN   Trich, Wet Prep NONE SEEN NONE SEEN   Clue Cells Wet Prep HPF POC NONE SEEN NONE SEEN   WBC, Wet Prep HPF POC >=10 (A) <10   Sperm NONE SEEN   POCT fern test     Status: None   Collection Time: 04/02/24  5:59 AM  Result Value Ref Range   POCT Fern Test Negative = intact amniotic membranes     Assessment and Plan  MDM Martha Potts is a 33 y.o. G3P1011 at [redacted]w[redacted]d, 05/27/2024, by Ultrasound, who presents to the MAU for multiple complaints noted above. Ddx: headache includes but is not limited to: preeclampsia, tension headache, cluster, trauma, concussion, migraine, CVA/SAH, viral syndrome, acute angle closure glaucoma, CO toxicity, temporal arteritis, meningitis, methanol use, lower abdominal pain includes but is not limited to:  round ligament pain, ectopic pregnancy, torsion, UTI, pyelonephritis, PID, cervicitis, chorioamnionitis, appendicitis, diverticulitis, constipation, nephrolithiasis, inguinal hernia, small bowel obstruction, and back pain includes but is not limited to: nephrolithiasis, constipation, acute back strain, postpartum back pain, degenerative  spine disease, disc herniation, epidural abscess, cauda equina, AAA, spine fracture.  Orders Placed This Encounter  Procedures   Wet prep, genital   CBC   Comprehensive metabolic panel with GFR   Protein / creatinine ratio, urine   Urinalysis, Routine w reflex microscopic -Urine, Clean Catch   Measure blood pressure   Meds ordered this encounter  Medications   acetaminophen  (TYLENOL ) tablet 1,000 mg   cyclobenzaprine  (FLEXERIL ) tablet 5 mg    Little relief with acetaminophen  and Flexeril . Declines oxycodone .  UA with rare bacteria, no leuks or nitrites. Ucx added given severity of patient's discomfort. D/w patient trying peribottle rinse with urination to help discern internal vs external discomforts to possibly help identify dx.  Wet prep and fern negative BP unremarkable, HA unlikely pre-eclamptic  1. [redacted] weeks gestation of pregnancy (Primary) - Discharge patient  2. Back pain in pregnancy  3. Dysuria  Results pending at the time of DC: GC/CT, Ucx Dispo: DC home in stable condition with return precautions discussed and included in AVS.    Martha Maier, DO [facility] for [facility]

## 2024-04-02 NOTE — MAU Note (Signed)
..  Martha Potts is a 33 y.o. at [redacted]w[redacted]d here in MAU reporting: mid back pain and abdominal pain that began Monday evening.  Took tylenol  the other day for the pain but it did not help.  Urinary frequency and pain when she voids. Has to wear a pad for leaking urine.  Elevated blood pressure at home 150/93 and 145/91, reports seeing spots earlier today but none now, reports headache that started an hour ago.  Denies vaginal bleeding or leaking of fluid. +FM.  Pain score: back pain 5/10; abdominal pain 5/10; headache 4/10 Vitals:   04/02/24 0202  BP: 128/87  Pulse: (!) 106  Resp: 17  Temp: (!) 97.5 F (36.4 C)  SpO2: 100%     FHT: will attempt in room Lab orders placed from triage:  UA

## 2024-04-03 LAB — CULTURE, OB URINE: Culture: NO GROWTH

## 2024-04-06 ENCOUNTER — Ambulatory Visit: Attending: Obstetrics | Admitting: Obstetrics

## 2024-04-06 ENCOUNTER — Ambulatory Visit

## 2024-04-06 ENCOUNTER — Other Ambulatory Visit: Payer: Self-pay | Admitting: Obstetrics

## 2024-04-06 ENCOUNTER — Other Ambulatory Visit: Payer: Self-pay | Admitting: *Deleted

## 2024-04-06 ENCOUNTER — Ambulatory Visit: Admitting: *Deleted

## 2024-04-06 VITALS — BP 121/69 | HR 88

## 2024-04-06 DIAGNOSIS — Z86718 Personal history of other venous thrombosis and embolism: Secondary | ICD-10-CM

## 2024-04-06 DIAGNOSIS — O99213 Obesity complicating pregnancy, third trimester: Secondary | ICD-10-CM

## 2024-04-06 DIAGNOSIS — O0990 Supervision of high risk pregnancy, unspecified, unspecified trimester: Secondary | ICD-10-CM

## 2024-04-06 DIAGNOSIS — O24419 Gestational diabetes mellitus in pregnancy, unspecified control: Secondary | ICD-10-CM

## 2024-04-06 DIAGNOSIS — O24410 Gestational diabetes mellitus in pregnancy, diet controlled: Secondary | ICD-10-CM

## 2024-04-06 DIAGNOSIS — O34593 Maternal care for other abnormalities of gravid uterus, third trimester: Secondary | ICD-10-CM

## 2024-04-06 DIAGNOSIS — O99210 Obesity complicating pregnancy, unspecified trimester: Secondary | ICD-10-CM

## 2024-04-06 DIAGNOSIS — O4440 Low lying placenta NOS or without hemorrhage, unspecified trimester: Secondary | ICD-10-CM

## 2024-04-06 DIAGNOSIS — Z3A32 32 weeks gestation of pregnancy: Secondary | ICD-10-CM

## 2024-04-06 NOTE — Progress Notes (Unsigned)
 Patient reports that she checks blood glucose at home and readings are as follows: Fasting levels range from 90 to 98, and two hour post-meal reading ranges from 98.to 122.

## 2024-04-06 NOTE — Progress Notes (Unsigned)
 MFM Consult Note  Martha Potts is currently at [redacted]w[redacted]d. She has been followed due to maternal obesity with a BMI of 55.2 and diet-controlled gestational diabetes.    She reports that the majority of her fingerstick values have been within normal limits.    She denies any problems since her last exam.  Her blood pressure today was 121/69.  Sonographic findings Single intrauterine pregnancy at 32w 5d. Fetal cardiac activity: Observed. Presentation: Cephalic. Fetal biometry shows the estimated fetal weight of 5 lb 6 oz,  2426g (89%). Amniotic fluid: Within normal limits. AFI: 17.89 cm.  MVP: 6.93 cm. Placenta: Posterior. BPP: 8/10 with a reactive NST. She received a -2 for fetal breathing movements that did not meet criteria.  Maternal obesity and gestational diabetes  We will continue weekly fetal testing until delivery.  The patient was advised that we will aim for a repeat cesarean delivery at around 39 weeks.  However should the overall EFW be greater than the 90th percentile at her next growth scan in 4 weeks, delivery may be considered at between 37 to 38 weeks.  She already has weekly NSTs scheduled in your office for the next 2 weeks.    She will return to our office in 3 weeks for a BPP.  The patient stated that all of her questions were answered.   A total of 20 minutes was spent counseling and coordinating the care for this patient.  Greater than 50% of the time was spent in direct face-to-face contact.

## 2024-04-06 NOTE — Procedures (Signed)
 PATSEY PITSTICK 01-16-1991 [redacted]w[redacted]d  Fetus A Non-Stress Test Interpretation for 04/06/24  Indication: Gestational Diabetes medication controlled and M.O.  Fetal Heart Rate A Mode: External Baseline Rate (A): 135 bpm Variability: Moderate Accelerations: 15 x 15 Decelerations: None Multiple birth?: No  Uterine Activity Mode: Toco Contraction Frequency (min): none Resting Tone Palpated: Relaxed  Interpretation (Fetal Testing) Nonstress Test Interpretation: Reactive Comments: Tracing reviewed byDr. Ileana

## 2024-04-14 ENCOUNTER — Other Ambulatory Visit: Payer: Self-pay

## 2024-04-14 ENCOUNTER — Ambulatory Visit: Admitting: *Deleted

## 2024-04-14 VITALS — BP 126/84 | HR 93 | Wt 329.0 lb

## 2024-04-14 DIAGNOSIS — O0990 Supervision of high risk pregnancy, unspecified, unspecified trimester: Secondary | ICD-10-CM

## 2024-04-14 MED ORDER — FAMOTIDINE 20 MG PO TABS: 20.0000 mg | ORAL_TABLET | Freq: Two times a day (BID) | ORAL | 3 refills | Status: AC

## 2024-04-14 MED ORDER — CYCLOBENZAPRINE HCL 10 MG PO TABS: 10.0000 mg | ORAL_TABLET | Freq: Three times a day (TID) | ORAL | 2 refills | Status: AC | PRN

## 2024-04-14 NOTE — Progress Notes (Signed)

## 2024-04-19 ENCOUNTER — Encounter (HOSPITAL_COMMUNITY): Payer: Self-pay | Admitting: Obstetrics and Gynecology

## 2024-04-19 ENCOUNTER — Other Ambulatory Visit: Payer: Self-pay

## 2024-04-19 ENCOUNTER — Inpatient Hospital Stay (HOSPITAL_COMMUNITY)
Admission: AD | Admit: 2024-04-19 | Discharge: 2024-04-19 | Disposition: A | Discharge status: Home / Self Care | Source: Ambulatory Visit | Attending: Obstetrics and Gynecology | Admitting: Obstetrics and Gynecology

## 2024-04-19 DIAGNOSIS — O4703 False labor before 37 completed weeks of gestation, third trimester: Secondary | ICD-10-CM | POA: Diagnosis not present

## 2024-04-19 DIAGNOSIS — O479 False labor, unspecified: Secondary | ICD-10-CM

## 2024-04-19 DIAGNOSIS — R519 Headache, unspecified: Secondary | ICD-10-CM

## 2024-04-19 DIAGNOSIS — O26893 Other specified pregnancy related conditions, third trimester: Secondary | ICD-10-CM | POA: Diagnosis not present

## 2024-04-19 DIAGNOSIS — Z3A34 34 weeks gestation of pregnancy: Secondary | ICD-10-CM

## 2024-04-19 DIAGNOSIS — R109 Unspecified abdominal pain: Secondary | ICD-10-CM | POA: Diagnosis present

## 2024-04-19 LAB — COMPREHENSIVE METABOLIC PANEL WITH GFR
ALT: 13 U/L (ref 0–44)
AST: 19 U/L (ref 15–41)
Albumin: 3.4 g/dL — ABNORMAL LOW (ref 3.5–5.0)
Alkaline Phosphatase: 163 U/L — ABNORMAL HIGH (ref 38–126)
Anion gap: 11 (ref 5–15)
BUN: 5 mg/dL — ABNORMAL LOW (ref 6–20)
CO2: 21 mmol/L — ABNORMAL LOW (ref 22–32)
Calcium: 9.1 mg/dL (ref 8.9–10.3)
Chloride: 105 mmol/L (ref 98–111)
Creatinine, Ser: 0.49 mg/dL (ref 0.44–1.00)
GFR, Estimated: >60 mL/min
Glucose, Bld: 88 mg/dL (ref 70–99)
Potassium: 4.1 mmol/L (ref 3.5–5.1)
Sodium: 136 mmol/L (ref 135–145)
Total Bilirubin: 0.3 mg/dL (ref 0.0–1.2)
Total Protein: 6.5 g/dL (ref 6.5–8.1)

## 2024-04-19 LAB — CBC WITH DIFFERENTIAL/PLATELET
Abs Immature Granulocytes: 0.07 K/uL (ref 0.00–0.07)
Basophils Absolute: 0 K/uL (ref 0.0–0.1)
Basophils Relative: 0 %
Eosinophils Absolute: 0 K/uL (ref 0.0–0.5)
Eosinophils Relative: 0 %
HCT: 34 % — ABNORMAL LOW (ref 36.0–46.0)
Hemoglobin: 11.8 g/dL — ABNORMAL LOW (ref 12.0–15.0)
Immature Granulocytes: 1 %
Lymphocytes Relative: 20 %
Lymphs Abs: 2.9 K/uL (ref 0.7–4.0)
MCH: 30.9 pg (ref 26.0–34.0)
MCHC: 34.7 g/dL (ref 30.0–36.0)
MCV: 89 fL (ref 80.0–100.0)
Monocytes Absolute: 0.9 K/uL (ref 0.1–1.0)
Monocytes Relative: 6 %
Neutro Abs: 10.5 K/uL — ABNORMAL HIGH (ref 1.7–7.7)
Neutrophils Relative %: 73 %
Platelets: 404 K/uL — ABNORMAL HIGH (ref 150–400)
RBC: 3.82 MIL/uL — ABNORMAL LOW (ref 3.87–5.11)
RDW: 13.3 % (ref 11.5–15.5)
WBC: 14.3 K/uL — ABNORMAL HIGH (ref 4.0–10.5)
nRBC: 0 % (ref 0.0–0.2)

## 2024-04-19 LAB — PROTEIN / CREATININE RATIO, URINE
Creatinine, Urine: 82 mg/dL
Protein Creatinine Ratio: 0.1 mg/mg
Total Protein, Urine: 10 mg/dL

## 2024-04-19 MED ORDER — CYCLOBENZAPRINE HCL 5 MG PO TABS: 10.0000 mg | ORAL_TABLET | Freq: Once | ORAL | Status: DC

## 2024-04-19 MED ORDER — METOCLOPRAMIDE HCL 10 MG PO TABS
10.0000 mg | ORAL_TABLET | Freq: Once | ORAL | Status: DC
  Filled 2024-04-19: qty 1

## 2024-04-19 MED ORDER — MORPHINE SULFATE (PF) 4 MG/ML IV SOLN
4.0000 mg | Freq: Once | INTRAVENOUS | Status: AC
  Administered 2024-04-19: 4 mg via INTRAVENOUS
  Filled 2024-04-19: qty 1

## 2024-04-19 MED ORDER — METOCLOPRAMIDE HCL 10 MG PO TABS: 10.0000 mg | ORAL_TABLET | Freq: Four times a day (QID) | ORAL | 0 refills | Status: DC | PRN

## 2024-04-19 MED ORDER — ACETAMINOPHEN-CAFFEINE 500-65 MG PO TABS
2.0000 | ORAL_TABLET | Freq: Once | ORAL | Status: DC
  Filled 2024-04-19: qty 2

## 2024-04-19 MED ORDER — MORPHINE SULFATE (PF) 4 MG/ML IV SOLN
4.0000 mg | Freq: Once | INTRAVENOUS | Status: AC
  Administered 2024-04-19: 4 mg via SUBCUTANEOUS
  Filled 2024-04-19: qty 1

## 2024-04-19 MED ORDER — LACTATED RINGERS IV BOLUS
1000.0000 mL | Freq: Once | INTRAVENOUS | Status: AC
  Administered 2024-04-19: 1000 mL via INTRAVENOUS

## 2024-04-19 NOTE — MAU Provider Note (Signed)
 Chief Complaint:  Abdominal Pain and Headache   HPI   None     Martha Potts is a 33 y.o. G3P1011 at [redacted]w[redacted]d who presents to maternity admissions reporting elevated blood pressure. She reports her BP at home was elevated last night and again this morning, readings 130s/105. She was checking her blood pressure because she had intense upper and lower abdominal pain. She has chronic lower abdominal pain but it has worsened quite a bit the past few weeks.  She reports a headache today. It was reduced with Tylenol  5 hours ago but is not completely gone. She states her vision has been getting blurrier this week, and her hands feel swollen. Denies vaginal bleeding, leaking of fluid, contractions, RUQ pain. She reports feeling fetal movement but it is not consistent which is stressful for her.  Additional history obtained from partner  Pregnancy Course: Receives care at [facility] . Prenatal records reviewed.  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  Past Medical History:  Diagnosis Date   Acute cholecystitis due to biliary calculus 04/21/2016   Deep vein phlebitis of upper extremity 09/2023   L arm   Family discord 10/05/2018   Gestational diabetes    Polyp of sigmoid colon    Rectal bleeding 01/21/2023   Uterus didelphus    OB History  Gravida Para Term Preterm AB Living  3 1 1  1 1   SAB IAB Ectopic Multiple Live Births  1        # Outcome Date GA Lbr Len/2nd Weight Sex Type Anes PTL Lv  3 Current           2 SAB 2017          1 Term 05/31/11    [name]      Past Surgical History:  Procedure Laterality Date   CERVIX SURGERY     CESAREAN SECTION     CHOLECYSTECTOMY N/A 04/22/2016   Procedure: LAPAROSCOPIC CHOLECYSTECTOMY;  Surgeon: Carlin Pastel, MD;  Location: [facility] ORS;  Service: General;  Laterality: N/A;   COLONOSCOPY WITH PROPOFOL   N/A 08/12/2018   Procedure: COLONOSCOPY WITH PROPOFOL ;  Surgeon: [name], MD;  Location: [facility] ENDOSCOPY;  Service: Endoscopy;  Laterality: N/A;  Urgent per Dr. Janalyn   COLONOSCOPY WITH PROPOFOL  N/A 01/21/2023   Procedure: COLONOSCOPY WITH PROPOFOL ;  Surgeon: Unk [name], MD;  Location: [facility] ENDOSCOPY;  Service: Gastroenterology;  Laterality: N/A;   Family History  Problem Relation Age of Onset   Stroke Father    Heart disease Father    Hypertension Father    Hypertension Sister    Cancer Maternal Aunt    Cervical cancer Maternal Aunt    Hypertension Paternal Aunt    Cancer Maternal Grandmother    Hodgkin's lymphoma Maternal Grandmother    Heart disease Paternal Grandfather    Cancer Paternal Grandfather    Lung cancer Paternal Grandfather    Diabetes Other    Social History[1] Allergies[2] Medications Prior to Admission  Medication Sig Dispense Refill Last Dose/Taking   acetaminophen  (TYLENOL ) 325 MG tablet Take 650 mg by mouth every 6 (six) hours as needed.   04/19/2024 at 12:00 PM   Accu-Chek Softclix Lancets lancets Use as instructed 100 each 12    aspirin  EC 81 MG tablet Take 1 tablet (81 mg total) by mouth daily. Swallow whole. 90 tablet 5    Blood Glucose  Monitoring Suppl (ACCU-CHEK GUIDE) w/Device KIT 1 Device by Does not apply route 4 (four) times daily. 1 kit 0    cyclobenzaprine  (FLEXERIL ) 10 MG tablet Take 1 tablet (10 mg total) by mouth 2 (two) times daily as needed for muscle spasms. 20 tablet 0    cyclobenzaprine  (FLEXERIL ) 10 MG tablet Take 1 tablet (10 mg total) by mouth 3 (three) times daily as needed for muscle spasms. 30 tablet 2    famotidine  (PEPCID ) 20 MG tablet Take 1 tablet (20 mg total) by mouth 2 (two) times daily. 60 tablet 3    glucose blood (ACCU-CHEK GUIDE TEST) test strip 1 each by Other route 4 (four) times daily. Use as instructed 100 each 12    Prenatal Vit-Fe Fumarate-FA (PRENATAL MULTIVITAMIN) TABS tablet Take 1 tablet by  mouth daily at 12 noon.       I have reviewed patient's Past Medical Hx, Surgical Hx, Family Hx, Social Hx, medications and allergies.   ROS  Pertinent items noted in HPI and remainder of comprehensive ROS otherwise negative.   PHYSICAL EXAM  Patient Vitals for the past 24 hrs:  BP Temp Temp src Pulse Resp SpO2 Height Weight  04/19/24 1801 107/62 -- -- 98 -- 99 % -- --  04/19/24 1748 114/63 -- -- (!) 110 -- 100 % -- --  04/19/24 1727 120/74 97.7 F (36.5 C) Oral 89 18 98 % 5' 5 (1.651 m) (!) 151.8 kg    Constitutional: Well-developed, well-nourished female in no acute distress. Patient is tearful during history taking. HEENT: atraumatic, normocephalic. Neck has normal ROM. EOM intact. Cardiovascular: normal rate & rhythm, warm and well-perfused Respiratory: normal effort, no problems with respiration noted GI: Abd soft, non-tender, non-distended MSK: Extremities nontender, no edema, normal ROM Skin: warm and dry. Acyanotic, no jaundice or pallor. Neurologic: Alert and oriented x 4. No abnormal coordination. Psychiatric: Normal mood. Speech not slurred, not rapid/pressured. Patient is cooperative. Cervical exam chaperoned by Griselda Punter RN Dilation: Fingertip Effacement (%): Thick Cervical Position: Posterior Exam by:: Kimarie Coor, PA  Fetal Tracing: Baseline FHR: 135 per minute Fetal heart variability: moderate Fetal Heart Rate accelerations: yes Fetal Heart Rate decelerations: none Fetal Non-stress Test: Category I (reactive) Toco: uterine contractions every 3-5 minutes  Labs: Results for orders placed or performed during the hospital encounter of 04/19/24 (from the past 24 hours)  Comprehensive metabolic panel with GFR     Status: Abnormal   Collection Time: 04/19/24  5:34 PM  Result Value Ref Range   Sodium 136 135 - 145 mmol/L   Potassium 4.1 3.5 - 5.1 mmol/L   Chloride 105 98 - 111 mmol/L   CO2 21 (L) 22 - 32 mmol/L   Glucose, Bld 88 70 - 99 mg/dL   BUN 5 (L)  6 - 20 mg/dL   Creatinine, Ser 9.50 0.44 - 1.00 mg/dL   Calcium 9.1 8.9 - 89.6 mg/dL   Total Protein 6.5 6.5 - 8.1 g/dL   Albumin 3.4 (L) 3.5 - 5.0 g/dL   AST 19 15 - 41 U/L   ALT 13 0 - 44 U/L   Alkaline Phosphatase 163 (H) 38 - 126 U/L   Total Bilirubin 0.3 0.0 - 1.2 mg/dL   GFR, Estimated >39 >39 mL/min   Anion gap 11 5 - 15  CBC with Differential/Platelet     Status: Abnormal   Collection Time: 04/19/24  5:34 PM  Result Value Ref Range   WBC 14.3 (H) 4.0 - 10.5 K/uL  RBC 3.82 (L) 3.87 - 5.11 MIL/uL   Hemoglobin 11.8 (L) 12.0 - 15.0 g/dL   HCT 65.9 (L) 63.9 - 53.9 %   MCV 89.0 80.0 - 100.0 fL   MCH 30.9 26.0 - 34.0 pg   MCHC 34.7 30.0 - 36.0 g/dL   RDW 86.6 88.4 - 84.4 %   Platelets 404 (H) 150 - 400 K/uL   nRBC 0.0 0.0 - 0.2 %   Neutrophils Relative % 73 %   Neutro Abs 10.5 (H) 1.7 - 7.7 K/uL   Lymphocytes Relative 20 %   Lymphs Abs 2.9 0.7 - 4.0 K/uL   Monocytes Relative 6 %   Monocytes Absolute 0.9 0.1 - 1.0 K/uL   Eosinophils Relative 0 %   Eosinophils Absolute 0.0 0.0 - 0.5 K/uL   Basophils Relative 0 %   Basophils Absolute 0.0 0.0 - 0.1 K/uL   Immature Granulocytes 1 %   Abs Immature Granulocytes 0.07 0.00 - 0.07 K/uL  Protein / creatinine ratio, urine     Status: None   Collection Time: 04/19/24  6:58 PM  Result Value Ref Range   Creatinine, Urine 82 mg/dL   Total Protein, Urine 10 mg/dL   Protein Creatinine Ratio 0.1 <0.2 mg/mg    Imaging:  No results found.  MDM & MAU COURSE  MDM: High  MAU Course: -Vital signs within normal limits. Normotensive, but must rule out preeclampsia given report of elevated blood pressures at home. -CMP, CBC, urine protein/creatinine ratio to rule out preeclampsia.  -Contractions on monitor, will start IV fluids and check cervix. Morphine  for pain relief. -CBC stable. No thrombocytopenia.  -CMP within normal limits for pregnancy. -Reactive NST. -Patient has felt fetal movement: 14 fetal movements felt during first hour  of monitoring. -No cervical dilation on exam. -Pain has improved with IV fluids and morphine . -urine protein/creatinine ratio within normal limits.  Differential diagnosis considered for headache includes but is not limited to: preeclampsia, tension headache, cluster, trauma, concussion, migraine, CVA/SAH, viral syndrome Differential diagnosis considered for elevated blood pressure includes but is not limited to: high risk conditions like preeclampsia or gestational hypertension; chronic hypertension; transient spurious elevated blood pressure   Orders Placed This Encounter  Procedures   Comprehensive metabolic panel with GFR   Protein / creatinine ratio, urine   CBC with Differential/Platelet   Discharge patient   Meds ordered this encounter  Medications   DISCONTD: metoCLOPramide  (REGLAN ) tablet 10 mg   DISCONTD: acetaminophen -caffeine  (EXCEDRIN TENSION HEADACHE) 500-65 MG per tablet 2 tablet   DISCONTD: cyclobenzaprine  (FLEXERIL ) tablet 10 mg   lactated ringers  bolus 1,000 mL   morphine  (PF) 4 MG/ML injection 4 mg   metoCLOPramide  (REGLAN ) 10 MG tablet    Sig: Take 1 tablet (10 mg total) by mouth every 6 (six) hours as needed (headache and/or nausea).    Dispense:  30 tablet    Refill:  0    ASSESSMENT   1. Headache in pregnancy, antepartum, third trimester   2. Uterine contractions   3. [redacted] weeks gestation of pregnancy     PLAN  Discharge home in stable condition with preterm labor precautions.  Tylenol , Reglan , and Flexeril  prn for headache. Continue Tylenol  and Flexeril  for pain management at home.    Follow-up Information     [facility] Follow up.   Specialty: Obstetrics and Gynecology Why: As scheduled for ongoing prenatal care Contact information: [address] [place]  [phone]  Allergies as of 04/19/2024   No Known Allergies      Medication List      TAKE these medications    Accu-Chek Guide Test test strip Generic drug: glucose blood 1 each by Other route 4 (four) times daily. Use as instructed   Accu-Chek Guide w/Device Kit 1 Device by Does not apply route 4 (four) times daily.   Accu-Chek Softclix Lancets lancets Use as instructed   acetaminophen  325 MG tablet Commonly known as: TYLENOL  Take 650 mg by mouth every 6 (six) hours as needed.   aspirin  EC 81 MG tablet Take 1 tablet (81 mg total) by mouth daily. Swallow whole.   cyclobenzaprine  10 MG tablet Commonly known as: FLEXERIL  Take 1 tablet (10 mg total) by mouth 2 (two) times daily as needed for muscle spasms.   cyclobenzaprine  10 MG tablet Commonly known as: FLEXERIL  Take 1 tablet (10 mg total) by mouth 3 (three) times daily as needed for muscle spasms.   famotidine  20 MG tablet Commonly known as: PEPCID  Take 1 tablet (20 mg total) by mouth 2 (two) times daily.   metoCLOPramide  10 MG tablet Commonly known as: REGLAN  Take 1 tablet (10 mg total) by mouth every 6 (six) hours as needed (headache and/or nausea).   prenatal multivitamin Tabs tablet Take 1 tablet by mouth daily at 12 noon.        Joesph DELENA Sear, PA      [1]  Social History Tobacco Use   Smoking status: Never   Smokeless tobacco: Never  Vaping Use   Vaping status: Never Used  Substance Use Topics   Alcohol use: No   Drug use: No  [2] No Known Allergies

## 2024-04-19 NOTE — MAU Note (Signed)
 Martha Potts is a 33 y.o. at [redacted]w[redacted]d here in MAU reporting: elevated BP last night and this am. Reports DFM, feeling movement here and there but not consistent. States she feels tingly in her arms and hands and also has a headache. States her hands feel very swollen. Reports blurry vision increase in the last week. Denies any CTXS, LOF or VB. Took tylenol  5 hours ago with some relief.  States she has constant chronic lower abdominal pain.  Pain score: HA 4  abd 7 Vitals:   04/19/24 1727  BP: 120/74  Pulse: 89  Resp: 18  Temp: 97.7 F (36.5 C)  SpO2: 98%     QYU:juuzfeuzi, will obtain once in room Lab orders placed from triage:

## 2024-04-19 NOTE — Discharge Instructions (Signed)
 Reasons to return to MAU at [facility]: Less than 36 weeks: Contractions feels like menstrual cramps. You should go to the hospital if you have more than 6 contractions in an hour, even after you have rested and drank at least 16 ounces of water.  More than 36 weeks: You begin to have strong, frequent contractions 5 minutes apart or less, each last 1 minute, these have been going on for 1-2 hours, and you cannot walk or talk during them. Your water breaks.  Sometimes it is a big gush of fluid. However, many times it may it may be much more subtle. You should go to the hospital if you have a constant leakage of fluid from your vagina, enough to soak a pad when you are walking around.  You have vaginal bleeding.  It is normal to have a small amount of spotting if your cervix was checked. If you have bleeding requiring the use of a pad, go to the hospital. You don't feel your baby moving like normal.  If you think that you babys movement is decreased, eat a snack and rest on your left side in a quiet room for one hour. If you have not felt the baby move more than 6 times in an hour GO TO THE HOSPITAL.     HEADACHE: If you get a headache at home, I recommend doing the following: 1) Drink 64 oz of water. Make sure you have something to eat. 2) Take 1000mg  tylenol  (2 extra-strength pills) AND metoclopramide  (Reglan ) 10 mg. Alternatively, you can take 2 tablets of Excedrin-Tension (NOT Excedrin migraine) and Reglan  10 mg. Look for TENSION headache formulation. The main ingredients should be Acetaminophen  (same as Tylenol ) and Caffeine ; should NOT have Aspirin  in the formulation  3) If you need to you can also take Benadryl . This will likely make you very sleepy. Please take a nap as sleep can help headaches go away.   If your headache does not improve after trying these, please go to the MAU.  PELVIC/ABDOMINAL PAIN: Up to 80 percent of women experience groin pain at some  point during pregnancy, mostly in that final trimester when stress on the pelvic region is especially intense. Your increasingly heavy baby is burrowing deeper into your pelvis in preparation for birth, and their head is now pressing hard against your bladder, rectum, hips and pelvic bones. The result is an ever-increasing stress on the joints, muscles and organs in your pelvis and back. Soak in a warm tub (not too hot) Tylenol  1000 mg by mouth every 6-8 hrs as needed for pain Slow position changes Laying on the affected side Wear a maternity support belt during the day. Take it off while sleeping. Apply a heating pad to your lower back for 20 minutes at a time, taking at least a 20-minute break before applying again. Massage: Starting at the middle of your pubic bone, trace little circles in a wide U from your pubic bone to your hip bones on both sides.  Then starting just above your pubic bone, press in and down, alternating sides to create a gentle rocking of your uterus back and forth.  Move your hands up the sides of your belly and back down. Do this 3-5 times upon waking and before bed.  Stretches: Get on hands and knees and alternate arching your back deeply while inhaling, and then rounding your back while exhaling. Modified runners lunge:  Sit on a chair with half of your  bottom on the chair and half off.  Sit up tall, plant your front foot, and stretch your other foot out behind you.  Breathe deeply for 5 breaths and then do the other side. Chiropractor or massage therapy: [facility] in [place] specializes in pregnancy care. You can visit their website at: [email or web address] to schedule a new patient chiropractic treatment (1 hour) appointment. Please let us  know if you have any other questions or concerns.   BACK PAIN: Rest Use ice/heat/warm bath Increase PO fluids, aim for 80-100 ounces of fluid intake each day Tylenol  is safe to take for pain.  Do not take more than 4000 mg total in 24 hours. Pregnancy support belt Pregnancy Yoga: There are free options through YouTube and you can purchase DVDs easily.  Float therapy: This involves soaking in a warm bath of magnesium to help relax muscles. Local places that have this option are [facility] in [place] or [facility] in [facility] Massage therapy: This is safe in pregnancy. Just assure your therapist is trained in prenatal massage. Some local options include [employer] and [facility]rop[facility]ic care: This involves re-aligning the bones and muscles of the body. You want to make sure the practitioner has training in pregnancy ([employer] Method).  A local option is [facility] [employer] and [employer]. [phone] [email or web address] and there are many other chiropractic offices that do adjustments in pregnancy

## 2024-04-21 ENCOUNTER — Ambulatory Visit (INDEPENDENT_AMBULATORY_CARE_PROVIDER_SITE_OTHER): Admitting: *Deleted

## 2024-04-21 ENCOUNTER — Ambulatory Visit (INDEPENDENT_AMBULATORY_CARE_PROVIDER_SITE_OTHER): Admitting: Family Medicine

## 2024-04-21 ENCOUNTER — Ambulatory Visit: Payer: Self-pay

## 2024-04-21 VITALS — BP 136/99 | HR 87 | Wt 342.0 lb

## 2024-04-21 VITALS — BP 123/84 | HR 84

## 2024-04-21 DIAGNOSIS — O34593 Maternal care for other abnormalities of gravid uterus, third trimester: Secondary | ICD-10-CM

## 2024-04-21 DIAGNOSIS — Z6791 Unspecified blood type, Rh negative: Secondary | ICD-10-CM | POA: Diagnosis not present

## 2024-04-21 DIAGNOSIS — O26893 Other specified pregnancy related conditions, third trimester: Secondary | ICD-10-CM

## 2024-04-21 DIAGNOSIS — O4443 Low lying placenta NOS or without hemorrhage, third trimester: Secondary | ICD-10-CM

## 2024-04-21 DIAGNOSIS — Z3A34 34 weeks gestation of pregnancy: Secondary | ICD-10-CM | POA: Diagnosis not present

## 2024-04-21 DIAGNOSIS — O4440 Low lying placenta NOS or without hemorrhage, unspecified trimester: Secondary | ICD-10-CM

## 2024-04-21 DIAGNOSIS — O0990 Supervision of high risk pregnancy, unspecified, unspecified trimester: Secondary | ICD-10-CM

## 2024-04-21 DIAGNOSIS — Q5128 Other doubling of uterus, other specified: Secondary | ICD-10-CM

## 2024-04-21 DIAGNOSIS — O99213 Obesity complicating pregnancy, third trimester: Secondary | ICD-10-CM

## 2024-04-21 DIAGNOSIS — O0993 Supervision of high risk pregnancy, unspecified, third trimester: Secondary | ICD-10-CM | POA: Diagnosis not present

## 2024-04-21 DIAGNOSIS — O24410 Gestational diabetes mellitus in pregnancy, diet controlled: Secondary | ICD-10-CM

## 2024-04-21 DIAGNOSIS — Z86718 Personal history of other venous thrombosis and embolism: Secondary | ICD-10-CM | POA: Diagnosis not present

## 2024-04-21 DIAGNOSIS — Z98891 History of uterine scar from previous surgery: Secondary | ICD-10-CM

## 2024-04-21 DIAGNOSIS — O26899 Other specified pregnancy related conditions, unspecified trimester: Secondary | ICD-10-CM

## 2024-04-21 DIAGNOSIS — K5901 Slow transit constipation: Secondary | ICD-10-CM

## 2024-04-21 MED ORDER — FLEET ENEMA RE ENEM: 1.0000 | ENEMA | Freq: Once | RECTAL | 0 refills | Status: AC

## 2024-04-21 MED ORDER — DOCUSATE SODIUM 100 MG PO CAPS: 100.0000 mg | ORAL_CAPSULE | Freq: Two times a day (BID) | ORAL | 2 refills | Status: AC | PRN

## 2024-04-21 NOTE — Progress Notes (Signed)
 "  PRENATAL VISIT NOTE  Subjective:  Martha Potts is a 33 y.o. G3P1011 at [redacted]w[redacted]d being seen today for ongoing prenatal care.  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  Patient reports backache and abdominal pain and constipation, poor sleep.  Contractions: Irregular. Vag. Bleeding: None.  Movement: Present. Denies leaking of fluid.   The following portions of the patient's history were reviewed and updated as appropriate: allergies, current medications, past family history, past medical history, past social history, past surgical history and problem list.   Objective:   Vitals:   04/21/24 1403  BP: (!) 136/99  Pulse: 87  Weight: (!) 342 lb (155.1 kg)    Fetal Status:  Fetal Heart Rate (bpm): NST   Movement: Present    General: Alert, oriented and cooperative. Patient is in no acute distress.  Skin: Skin is warm and dry. No rash noted.   Cardiovascular: Normal heart rate noted  Respiratory: Normal respiratory effort, no problems with respiration noted  Abdomen: Soft, gravid, appropriate for gestational age.  Pain/Pressure: Present     Pelvic: Cervical exam performed in the presence of a chaperone        Extremities: Normal range of motion.     Mental Status: Normal mood and affect. Normal behavior. Normal judgment and thought content.      01/17/2024   11:29 AM  Depression screen PHQ 2/9  Decreased Interest 0  Down, Depressed, Hopeless 0  PHQ - 2 Score 0          No data to display          Assessment and Plan:  Pregnancy: G3P1011 at [redacted]w[redacted]d 1. Diet controlled gestational diabetes mellitus (GDM) in third trimester (Primary) EFW is 89%, on diet NST:  Baseline: 135 bpm, Variability: Good {> 6 bpm), Accelerations: Reactive, and Decelerations: Absent Report CBGs are in range. 1 value of 150 mg.  2. 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 Supervision of high risk pregnancy, antepartum BP is elevated on first check but back to baseline. Weight gain is significant this week.  4. Rh negative state in antepartum period S/p rhogam and pp if indicated   5. Low-lying placenta Resolved  6. History of cesarean delivery For RCS and BTL--scheduled at 38 weeks, given co-morbidities, but if > 90% at next weight check, will move to 37 weeks - Ambulatory Referral For Surgery Scheduling  7. History of DVT (deep vein thrombosis) Considering 6 weeks of pp lovenox   8. Slow transit constipation Increase miralax  to bid, add colace and enema. Hopefully, this helps with some of the pain. - docusate sodium  (COLACE) 100 MG capsule; Take 1 capsule (100 mg total) by mouth 2 (two) times daily as needed.  Dispense: 30 capsule; Refill: 2 - sodium phosphate  (FLEET) ENEM; Place 133 mLs (1 enema total) rectally once for 1 dose.  Dispense: 133 mL; Refill: 0  9. [redacted] weeks gestation of pregnancy   Preterm labor symptoms and general obstetric precautions including but not limited to vaginal bleeding,  contractions, leaking of fluid and fetal movement were reviewed in detail with the patient. Please refer to After Visit Summary for other counseling recommendations.   Return in 2 weeks (on 05/05/2024).  Future Appointments  Date Time Provider Department Center  04/28/2024  1:10 PM [facility] NST [facility]  05/05/2024  1:10 PM Anyanwu, Gloris LABOR, MD [facility]  05/12/2024  2:10 PM [facility] NST [facility]  05/12/2024  2:30 PM Anyanwu, Gloris LABOR, MD [facility]    Glenys GORMAN Birk, MD  "

## 2024-04-21 NOTE — Progress Notes (Signed)

## 2024-04-28 ENCOUNTER — Ambulatory Visit: Admitting: *Deleted

## 2024-04-28 ENCOUNTER — Other Ambulatory Visit: Payer: Self-pay

## 2024-04-28 VITALS — BP 143/98 | HR 88 | Wt 346.0 lb

## 2024-04-28 DIAGNOSIS — O99213 Obesity complicating pregnancy, third trimester: Secondary | ICD-10-CM

## 2024-04-28 DIAGNOSIS — O24410 Gestational diabetes mellitus in pregnancy, diet controlled: Secondary | ICD-10-CM

## 2024-04-28 DIAGNOSIS — O0990 Supervision of high risk pregnancy, unspecified, unspecified trimester: Secondary | ICD-10-CM

## 2024-04-28 DIAGNOSIS — R030 Elevated blood-pressure reading, without diagnosis of hypertension: Secondary | ICD-10-CM

## 2024-04-28 DIAGNOSIS — O0993 Supervision of high risk pregnancy, unspecified, third trimester: Secondary | ICD-10-CM | POA: Diagnosis not present

## 2024-04-28 NOTE — Patient Instructions (Signed)
"   NESREEN ALBANO  04/28/2024   Your procedure is scheduled on:  05/13/2024  Arrive at 1130 at [employer] on [address] at [facility]  and [address]. You are invited to use the FREE valet parking or use the Visitor's parking deck.  Pick up the phone at the desk and dial [phone].  Call this number if you have problems the morning of surgery: [phone]  Remember:   Do not eat food:(After Midnight) Desps de medianoche.  You may drink clear liquids until  ___0930__.  Clear liquids means a liquid you can see thru.  It can have color such as Cola or Kool aid.  Tea is OK and coffee as long as no milk or creamer of any kind.  Take these medicines the morning of surgery with A SIP OF WATER:  none   Do not wear jewelry, make-up or nail polish.  Do not wear lotions, powders, or perfumes. Do not wear deodorant.  Do not shave 48 hours prior to surgery.  Do not bring valuables to the hospital.  [facility] is not   responsible for any belongings or valuables brought to the hospital.  Contacts, dentures or bridgework may not be worn into surgery.  Leave suitcase in the car. After surgery it may be brought to your room.  For patients admitted to the hospital, checkout time is 11:00 AM the day of              discharge.      Please read over the following fact sheets that you were given:     Preparing for Surgery   "

## 2024-04-28 NOTE — Progress Notes (Signed)
 Patient informed that the ultrasound is considered a limited obstetric ultrasound and is not intended to be a complete ultrasound exam.  Patient also informed that the ultrasound is not being completed with the intent of assessing for fetal or placental anomalies or any pelvic abnormalities. Explained that the purpose of today's ultrasound is to assess for fetal well being.  Patient acknowledges the purpose of the exam and the limitations of the study.        Wanda Buckles, RN   Pt had elevated BP's today, went ahead and collected lab work today. MD informed

## 2024-04-29 ENCOUNTER — Telehealth (HOSPITAL_COMMUNITY): Payer: Self-pay | Admitting: *Deleted

## 2024-04-29 ENCOUNTER — Encounter (HOSPITAL_COMMUNITY): Payer: Self-pay

## 2024-04-29 ENCOUNTER — Telehealth: Payer: Self-pay | Admitting: *Deleted

## 2024-04-29 LAB — COMPREHENSIVE METABOLIC PANEL WITH GFR
ALT: 11 IU/L (ref 0–32)
AST: 17 IU/L (ref 0–40)
Albumin: 3.5 g/dL — ABNORMAL LOW (ref 3.9–4.9)
Alkaline Phosphatase: 193 IU/L — ABNORMAL HIGH (ref 41–116)
BUN/Creatinine Ratio: 13 (ref 9–23)
BUN: 6 mg/dL (ref 6–20)
Bilirubin Total: 0.3 mg/dL (ref 0.0–1.2)
CO2: 19 mmol/L — ABNORMAL LOW (ref 20–29)
Calcium: 9.1 mg/dL (ref 8.7–10.2)
Chloride: 102 mmol/L (ref 96–106)
Creatinine, Ser: 0.47 mg/dL — ABNORMAL LOW (ref 0.57–1.00)
Globulin, Total: 2.6 g/dL (ref 1.5–4.5)
Glucose: 84 mg/dL (ref 70–99)
Potassium: 4.3 mmol/L (ref 3.5–5.2)
Sodium: 137 mmol/L (ref 134–144)
Total Protein: 6.1 g/dL (ref 6.0–8.5)
eGFR: 130 mL/min/1.73

## 2024-04-29 LAB — CBC
Hematocrit: 37.7 % (ref 34.0–46.6)
Hemoglobin: 12.6 g/dL (ref 11.1–15.9)
MCH: 30.5 pg (ref 26.6–33.0)
MCHC: 33.4 g/dL (ref 31.5–35.7)
MCV: 91 fL (ref 79–97)
Platelets: 407 x10E3/uL (ref 150–450)
RBC: 4.13 x10E6/uL (ref 3.77–5.28)
RDW: 12.7 % (ref 11.7–15.4)
WBC: 14.2 x10E3/uL — ABNORMAL HIGH (ref 3.4–10.8)

## 2024-04-29 LAB — PROTEIN / CREATININE RATIO, URINE
Creatinine, Urine: 65.3 mg/dL
Protein, Ur: 8.5 mg/dL
Protein/Creat Ratio: 130 mg/g{creat} (ref 0–200)

## 2024-04-29 NOTE — Telephone Encounter (Signed)
 Preadmission screen

## 2024-04-29 NOTE — Telephone Encounter (Signed)
 Returned pts voicemail regarding her blood pressures. Pt states they have been anywhere from 130-150's over 90-100. Denies any other concerns. Discussed with pt that we should have her come in for a BP check on Friday when we are back open and if at any point if she has any severe high blood pressure she should go to the hospital. Pt verbalizes and understands. Appt made for 05/01/24 at 10:15

## 2024-05-01 ENCOUNTER — Ambulatory Visit: Admitting: *Deleted

## 2024-05-01 ENCOUNTER — Inpatient Hospital Stay (HOSPITAL_COMMUNITY)
Admission: AD | Admit: 2024-05-01 | Discharge: 2024-05-01 | Disposition: A | Discharge status: Home / Self Care | Attending: Obstetrics and Gynecology | Admitting: Obstetrics and Gynecology

## 2024-05-01 ENCOUNTER — Encounter (HOSPITAL_COMMUNITY): Payer: Self-pay | Admitting: Obstetrics and Gynecology

## 2024-05-01 VITALS — BP 137/92 | HR 90

## 2024-05-01 DIAGNOSIS — O24410 Gestational diabetes mellitus in pregnancy, diet controlled: Secondary | ICD-10-CM | POA: Diagnosis not present

## 2024-05-01 DIAGNOSIS — O34211 Maternal care for low transverse scar from previous cesarean delivery: Secondary | ICD-10-CM | POA: Diagnosis not present

## 2024-05-01 DIAGNOSIS — R1011 Right upper quadrant pain: Secondary | ICD-10-CM | POA: Insufficient documentation

## 2024-05-01 DIAGNOSIS — I10 Essential (primary) hypertension: Secondary | ICD-10-CM | POA: Diagnosis present

## 2024-05-01 DIAGNOSIS — R202 Paresthesia of skin: Secondary | ICD-10-CM

## 2024-05-01 DIAGNOSIS — O0993 Supervision of high risk pregnancy, unspecified, third trimester: Secondary | ICD-10-CM

## 2024-05-01 DIAGNOSIS — Z3A36 36 weeks gestation of pregnancy: Secondary | ICD-10-CM

## 2024-05-01 DIAGNOSIS — R519 Headache, unspecified: Secondary | ICD-10-CM | POA: Diagnosis present

## 2024-05-01 DIAGNOSIS — O0990 Supervision of high risk pregnancy, unspecified, unspecified trimester: Secondary | ICD-10-CM

## 2024-05-01 DIAGNOSIS — R200 Anesthesia of skin: Secondary | ICD-10-CM | POA: Diagnosis not present

## 2024-05-01 DIAGNOSIS — O133 Gestational [pregnancy-induced] hypertension without significant proteinuria, third trimester: Secondary | ICD-10-CM | POA: Diagnosis not present

## 2024-05-01 LAB — CBC
HCT: 33.7 % — ABNORMAL LOW (ref 36.0–46.0)
Hemoglobin: 11.7 g/dL — ABNORMAL LOW (ref 12.0–15.0)
MCH: 30.5 pg (ref 26.0–34.0)
MCHC: 34.7 g/dL (ref 30.0–36.0)
MCV: 87.8 fL (ref 80.0–100.0)
Platelets: 402 K/uL — ABNORMAL HIGH (ref 150–400)
RBC: 3.84 MIL/uL — ABNORMAL LOW (ref 3.87–5.11)
RDW: 13.3 % (ref 11.5–15.5)
WBC: 14.5 K/uL — ABNORMAL HIGH (ref 4.0–10.5)
nRBC: 0 % (ref 0.0–0.2)

## 2024-05-01 LAB — COMPREHENSIVE METABOLIC PANEL WITH GFR
ALT: 11 U/L (ref 0–44)
AST: 21 U/L (ref 15–41)
Albumin: 3.3 g/dL — ABNORMAL LOW (ref 3.5–5.0)
Alkaline Phosphatase: 181 U/L — ABNORMAL HIGH (ref 38–126)
Anion gap: 13 (ref 5–15)
BUN: 10 mg/dL (ref 6–20)
CO2: 19 mmol/L — ABNORMAL LOW (ref 22–32)
Calcium: 9.2 mg/dL (ref 8.9–10.3)
Chloride: 104 mmol/L (ref 98–111)
Creatinine, Ser: 0.49 mg/dL (ref 0.44–1.00)
GFR, Estimated: >60 mL/min
Glucose, Bld: 95 mg/dL (ref 70–99)
Potassium: 4.2 mmol/L (ref 3.5–5.1)
Sodium: 136 mmol/L (ref 135–145)
Total Bilirubin: 0.3 mg/dL (ref 0.0–1.2)
Total Protein: 6.6 g/dL (ref 6.5–8.1)

## 2024-05-01 LAB — PROTEIN / CREATININE RATIO, URINE
Creatinine, Urine: 32 mg/dL
Total Protein, Urine: <6 mg/dL

## 2024-05-01 MED ORDER — LIDOCAINE VISCOUS HCL 2 % MT SOLN
15.0000 mL | Freq: Once | OROMUCOSAL | Status: AC
  Administered 2024-05-01: 15 mL via ORAL
  Filled 2024-05-01: qty 15

## 2024-05-01 MED ORDER — ALUM & MAG HYDROXIDE-SIMETH 200-200-20 MG/5ML PO SUSP
30.0000 mL | Freq: Once | ORAL | Status: AC
  Administered 2024-05-01: 30 mL via ORAL
  Filled 2024-05-01: qty 30

## 2024-05-01 MED ORDER — LACTATED RINGERS IV BOLUS
1000.0000 mL | Freq: Once | INTRAVENOUS | Status: AC
  Administered 2024-05-01: 1000 mL via INTRAVENOUS

## 2024-05-01 MED ORDER — FAMOTIDINE IN NACL 20-0.9 MG/50ML-% IV SOLN
20.0000 mg | Freq: Once | INTRAVENOUS | Status: AC
  Administered 2024-05-01: 20 mg via INTRAVENOUS
  Filled 2024-05-01: qty 50

## 2024-05-01 MED ORDER — METOCLOPRAMIDE HCL 5 MG/ML IJ SOLN
10.0000 mg | Freq: Once | INTRAMUSCULAR | Status: AC
  Administered 2024-05-01: 10 mg via INTRAVENOUS
  Filled 2024-05-01: qty 2

## 2024-05-01 MED ORDER — ACETAMINOPHEN-CAFFEINE 500-65 MG PO TABS
2.0000 | ORAL_TABLET | Freq: Once | ORAL | Status: AC
  Administered 2024-05-01: 2 via ORAL
  Filled 2024-05-01: qty 2

## 2024-05-01 NOTE — Progress Notes (Signed)
 Subjective:  Martha Potts is a 34 y.o. female here for BP check.   Hypertension ROS: Patient states she was woken up at 5am with a HA, took 2 extra strength Tylenol  and HA os still there. Pt also complains up RUQ/epigastric pain.   Objective:  BP (!) 137/92   Pulse 90   LMP 08/13/2023   Appearance alert, well appearing, and in no distress. General exam BP noted to be elevated today in office.    Assessment:   Blood Pressure needs further observation.   Plan:  Advised pt that due to her HA not resolving along with her RUQ pain, she needs togo to MAU to be evaluated Will let MAU providers know as well.   Wanda Buckles, RN

## 2024-05-01 NOTE — MAU Provider Note (Addendum)
 Chief Complaint:  Abdominal Pain and Hypertension   HPI   Event Date/Time   First Provider Initiated Contact with Patient 05/01/24 1309      Martha Potts is a 34 y.o. G3P1011 at [redacted]w[redacted]d who presents to maternity admissions for evaluation of elevated blood pressure in office. She was sent to MAU from office for r/o Pre-E. She reports RUQ pain, dizziness, numbness/tingling in both hands and a headache. She rates her headache 8/10. She states she took two Tylenol  (200 mg) this morning approximately around 0900. Denies fever/chills, vaginal bleeding, LOF, and contractions.   Pregnancy Course: GDM-diet controlled and gHTN this pregnancy. She has a cesarean delivery w/BTL scheduled for 05/13/2024.   Past Medical History:  Diagnosis Date   Acute cholecystitis due to biliary calculus 04/21/2016   Deep vein phlebitis of upper extremity 09/2023   L arm   Family discord 10/05/2018   Gestational diabetes    Polyp of sigmoid colon    Rectal bleeding 01/21/2023   Uterus didelphus    OB History  Gravida Para Term Preterm AB Living  3 1 1  1 1   SAB IAB Ectopic Multiple Live Births  1        # Outcome Date GA Lbr Len/2nd Weight Sex Type Anes PTL Lv  3 Current           2 SAB 2017          1 Term 05/31/11    [name]      Past Surgical History:  Procedure Laterality Date   CERVIX SURGERY     CESAREAN SECTION     CHOLECYSTECTOMY N/A 04/22/2016   Procedure: LAPAROSCOPIC CHOLECYSTECTOMY;  Surgeon: Carlin Pastel, MD;  Location: [facility] ORS;  Service: General;  Laterality: N/A;   COLONOSCOPY WITH PROPOFOL  N/A 08/12/2018   Procedure: COLONOSCOPY WITH PROPOFOL ;  Surgeon: [name], MD;  Location: [facility] ENDOSCOPY;  Service: Endoscopy;  Laterality: N/A;  Urgent per Dr. Janalyn   COLONOSCOPY WITH PROPOFOL  N/A 01/21/2023   Procedure: COLONOSCOPY WITH PROPOFOL ;  Surgeon: Unk [name], MD;  Location: [facility] ENDOSCOPY;  Service: Gastroenterology;  Laterality: N/A;   Family History   Problem Relation Age of Onset   Stroke Father    Heart disease Father    Hypertension Father    Hypertension Sister    Cancer Maternal Aunt    Cervical cancer Maternal Aunt    Hypertension Paternal Aunt    Cancer Maternal Grandmother    Hodgkin's lymphoma Maternal Grandmother    Heart disease Paternal Grandfather    Cancer Paternal Grandfather    Lung cancer Paternal Grandfather    Diabetes Other    Social History[1] Allergies[2] Medications Prior to Admission  Medication Sig Dispense Refill Last Dose/Taking   acetaminophen  (TYLENOL ) 325 MG tablet Take 650 mg by mouth every 6 (six) hours as needed.   05/01/2024 at  9:00 AM   aspirin  EC 81 MG tablet Take 1 tablet (81 mg total) by mouth daily. Swallow whole. 90 tablet 5 04/30/2024   cyclobenzaprine  (FLEXERIL ) 10 MG tablet Take 1 tablet (10 mg total) by mouth 2 (two) times daily as needed for muscle spasms. 20 tablet 0 Past Week   Prenatal Vit-Fe Fumarate-FA (PRENATAL MULTIVITAMIN) TABS tablet Take 1 tablet by mouth daily at 12 noon.   04/30/2024   Accu-Chek Softclix Lancets lancets Use as instructed 100 each 12    Blood Glucose Monitoring Suppl (ACCU-CHEK GUIDE) w/Device KIT 1 Device by Does not apply route 4 (four)  times daily. 1 kit 0    cyclobenzaprine  (FLEXERIL ) 10 MG tablet Take 1 tablet (10 mg total) by mouth 3 (three) times daily as needed for muscle spasms. 30 tablet 2    docusate sodium  (COLACE) 100 MG capsule Take 1 capsule (100 mg total) by mouth 2 (two) times daily as needed. 30 capsule 2 More than a month   famotidine  (PEPCID ) 20 MG tablet Take 1 tablet (20 mg total) by mouth 2 (two) times daily. 60 tablet 3 More than a month   glucose blood (ACCU-CHEK GUIDE TEST) test strip 1 each by Other route 4 (four) times daily. Use as instructed 100 each 12    metoCLOPramide  (REGLAN ) 10 MG tablet Take 1 tablet (10 mg total) by mouth every 6 (six) hours as needed (headache and/or nausea). 30 tablet 0     I have reviewed patient's Past  Medical Hx, Surgical Hx, Family Hx, Social Hx, medications and allergies.   ROS  Review of Systems  Constitutional: Negative.   Eyes:        Per patient, reports seeing spots  Cardiovascular:  Negative for leg swelling.  Gastrointestinal:  Positive for abdominal pain (intermmitent RUQ pain). Negative for nausea and vomiting.  Neurological:  Positive for dizziness and headaches.  Psychiatric/Behavioral: Negative.       PHYSICAL EXAM  Patient Vitals for the past 24 hrs:  BP Temp Temp src Pulse Resp SpO2 Height Weight  05/01/24 1522 123/77 -- -- 83 -- -- -- --  05/01/24 1355 -- -- -- -- -- 99 % -- --  05/01/24 1350 -- -- -- -- -- 99 % -- --  05/01/24 1346 125/88 -- -- (!) 106 -- -- -- --  05/01/24 1345 -- -- -- -- -- 99 % -- --  05/01/24 1340 -- -- -- -- -- 99 % -- --  05/01/24 1335 -- -- -- -- -- 99 % -- --  05/01/24 1331 (!) 128/94 -- -- (!) 101 -- -- -- --  05/01/24 1330 -- -- -- -- -- 99 % -- --  05/01/24 1325 -- -- -- -- -- 99 % -- --  05/01/24 1320 -- -- -- -- -- 100 % -- --  05/01/24 1316 134/75 -- -- (!) 107 -- -- -- --  05/01/24 1315 -- -- -- -- -- 98 % -- --  05/01/24 1314 -- -- -- -- -- 98 % -- --  05/01/24 1310 -- -- -- -- -- 97 % -- --  05/01/24 1305 -- -- -- -- -- 99 % -- --  05/01/24 1301 124/83 -- -- 96 -- -- -- --  05/01/24 1300 -- -- -- -- -- 98 % -- --  05/01/24 1255 -- -- -- -- -- 98 % -- --  05/01/24 1250 -- -- -- -- -- 98 % -- --  05/01/24 1248 121/89 -- -- 100 -- -- -- --  05/01/24 1245 -- -- -- -- -- 99 % -- --  05/01/24 1240 -- -- -- -- -- 99 % -- --  05/01/24 1235 -- -- -- -- -- 99 % -- --  05/01/24 1234 -- -- -- -- -- 99 % -- --  05/01/24 1231 109/79 -- -- 98 -- -- -- --  05/01/24 1230 -- -- -- -- -- 98 % -- --  05/01/24 1225 -- -- -- -- -- 99 % -- --  05/01/24 1214 139/88 (!) 97.5 F (36.4 C) Oral (!) 103 20 -- 5' 5 (1.651 m) (!) 154.2 kg  Constitutional: Well-developed, well-nourished female in no acute distress.  Cardiovascular: normal  rate & rhythm Respiratory: normal effort, no problems with respiration noted GI: Abd soft, non-tender, non-distended, gravid MS: Extremities nontender, no edema, normal ROM Neurologic: Alert and oriented x 4.  Pelvic: Deferred      NST:  Baseline: 140 bpm Variability: Moderate  Accelerations: 15 x 15, reactive  Decelerations: None Toco: occassional x 4, mild    Labs: Results for orders placed or performed during the hospital encounter of 05/01/24 (from the past 24 hours)  CBC     Status: Abnormal   Collection Time: 05/01/24 12:10 PM  Result Value Ref Range   WBC 14.5 (H) 4.0 - 10.5 K/uL   RBC 3.84 (L) 3.87 - 5.11 MIL/uL   Hemoglobin 11.7 (L) 12.0 - 15.0 g/dL   HCT 66.2 (L) 63.9 - 53.9 %   MCV 87.8 80.0 - 100.0 fL   MCH 30.5 26.0 - 34.0 pg   MCHC 34.7 30.0 - 36.0 g/dL   RDW 86.6 88.4 - 84.4 %   Platelets 402 (H) 150 - 400 K/uL   nRBC 0.0 0.0 - 0.2 %  Comprehensive metabolic panel with GFR     Status: Abnormal   Collection Time: 05/01/24 12:10 PM  Result Value Ref Range   Sodium 136 135 - 145 mmol/L   Potassium 4.2 3.5 - 5.1 mmol/L   Chloride 104 98 - 111 mmol/L   CO2 19 (L) 22 - 32 mmol/L   Glucose, Bld 95 70 - 99 mg/dL   BUN 10 6 - 20 mg/dL   Creatinine, Ser 9.50 0.44 - 1.00 mg/dL   Calcium 9.2 8.9 - 89.6 mg/dL   Total Protein 6.6 6.5 - 8.1 g/dL   Albumin 3.3 (L) 3.5 - 5.0 g/dL   AST 21 15 - 41 U/L   ALT 11 0 - 44 U/L   Alkaline Phosphatase 181 (H) 38 - 126 U/L   Total Bilirubin 0.3 0.0 - 1.2 mg/dL   GFR, Estimated >39 >39 mL/min   Anion gap 13 5 - 15  Protein / creatinine ratio, urine     Status: None   Collection Time: 05/01/24 12:12 PM  Result Value Ref Range   Creatinine, Urine 32 mg/dL   Total Protein, Urine <6 mg/dL   Protein Creatinine Ratio NOT CALCULATED <0.2 mg/mg    Imaging:  No results found.  MDM & MAU COURSE  MDM: Moderate   MAU Course: 1309: Assessment  - P/C ratio, CMP, and CBC collected.   - Excedrin Tension, Maalox, and lidocaine  (GI  cocktail) attempted to be given.                Incomplete due to patient vomiting.   - NST completed.   - 1 L LR bolus, IVPB Pepcid  20 mg, and IV Reglan  10 mg given.  1320: Reassessment  - Patient no longer has a headache and is feeling better.   - Lab results discussed with patient. Patient has no further questions.  Orders Placed This Encounter  Procedures   CBC   Comprehensive metabolic panel with GFR   Protein / creatinine ratio, urine   Fetal non-stress test   Discharge patient Discharge disposition: 01-Home or Self Care; Discharge patient date: 05/01/2024   Meds ordered this encounter  Medications   AND Linked Order Group    alum & mag hydroxide-simeth (MAALOX/MYLANTA) 200-200-20 MG/5ML suspension 30 mL    lidocaine  (XYLOCAINE ) 2 % viscous mouth solution 15 mL  acetaminophen -caffeine  (EXCEDRIN TENSION HEADACHE) 500-65 MG per tablet 2 tablet   metoCLOPramide  (REGLAN ) injection 10 mg   famotidine  (PEPCID ) IVPB 20 mg premix   lactated ringers  bolus 1,000 mL    ASSESSMENT   1. Gestational hypertension, third trimester   2. [redacted] weeks gestation of pregnancy   3. RUQ pain   4. Numbness and tingling in both hands    PLAN  Gestational hypertension, third trimester/ R/O Pre-E - Labs WNL. Lab results discussed with patient.  - Blood pressure in normotensive (109-139/75-94) in MAU (05/02/2023). Multiple    elevated blood pressure outpatient (see progress notes).   - Message sent to move up cesarean delivery from 05/13/2024 to the week of                05/06/2024.  - Denies dizziness and headache after medication given in MAU.   - Pre-E warning signs discussed with patient which includes headaches that                do not improve with Tylenol , worsening RUQ pain, visual disturbances, and                swelling in lower extremities.   - OTC Tylenol  (1,000 mg q 6 hrs) recommended to patient to take in efforts to                help manage headaches.   2. [redacted] weeks gestation of  pregnancy - NST Reactive.   3. RUQ Pain  - Patient no longer has RUQ pain and is feeling better after GI cocktail.   4. Numbness and tingling in both hands  - Exercises discussed with patient to help manage numbness and tingling in                both hands. Patient verbalized understanding and had no further questions.    - Discharge home in stable condition with strict return precautions.     Allergies as of 05/01/2024   No Known Allergies      Medication List     TAKE these medications    Accu-Chek Guide Test test strip Generic drug: glucose blood 1 each by Other route 4 (four) times daily. Use as instructed   Accu-Chek Guide w/Device Kit 1 Device by Does not apply route 4 (four) times daily.   Accu-Chek Softclix Lancets lancets Use as instructed   acetaminophen  325 MG tablet Commonly known as: TYLENOL  Take 650 mg by mouth every 6 (six) hours as needed.   aspirin  EC 81 MG tablet Take 1 tablet (81 mg total) by mouth daily. Swallow whole.   cyclobenzaprine  10 MG tablet Commonly known as: FLEXERIL  Take 1 tablet (10 mg total) by mouth 2 (two) times daily as needed for muscle spasms.   cyclobenzaprine  10 MG tablet Commonly known as: FLEXERIL  Take 1 tablet (10 mg total) by mouth 3 (three) times daily as needed for muscle spasms.   docusate sodium  100 MG capsule Commonly known as: COLACE Take 1 capsule (100 mg total) by mouth 2 (two) times daily as needed.   famotidine  20 MG tablet Commonly known as: PEPCID  Take 1 tablet (20 mg total) by mouth 2 (two) times daily.   metoCLOPramide  10 MG tablet Commonly known as: REGLAN  Take 1 tablet (10 mg total) by mouth every 6 (six) hours as needed (headache and/or nausea).   prenatal multivitamin Tabs tablet Take 1 tablet by mouth daily at 12 noon.  Lamar Meter, CNM, MSN Certified Nurse Midwife, [facility]         [1]  Social History Tobacco Use   Smoking status: Never   Smokeless  tobacco: Never  Vaping Use   Vaping status: Never Used  Substance Use Topics   Alcohol use: No   Drug use: No  [2] No Known Allergies

## 2024-05-01 NOTE — MAU Note (Signed)
 Martha Potts is a 34 y.o. at [redacted]w[redacted]d here in MAU reporting: sent from office, R/O Pre-E. Pt also complaining of right upper quad abd pain that comes and goes. Headache pain score of 8, that won't go away. Baby doesn't move as much as normal. No vag bleeding, no loss of fluid.   Vitals:   05/01/24 1214  BP: 139/88  Pulse: (!) 103  Resp: 20  Temp: (!) 97.5 F (36.4 C)

## 2024-05-04 ENCOUNTER — Encounter: Payer: Self-pay | Admitting: Obstetrics & Gynecology

## 2024-05-04 ENCOUNTER — Encounter (HOSPITAL_COMMUNITY): Payer: Self-pay

## 2024-05-04 ENCOUNTER — Encounter: Payer: Self-pay | Admitting: Family Medicine

## 2024-05-05 ENCOUNTER — Encounter: Admitting: Obstetrics & Gynecology

## 2024-05-05 ENCOUNTER — Encounter (HOSPITAL_COMMUNITY): Payer: Self-pay

## 2024-05-05 NOTE — Anesthesia Preprocedure Evaluation (Signed)
"                                    Anesthesia Evaluation  Patient identified by MRN, date of birth, ID band Patient awake    Reviewed: Allergy & Precautions, NPO status , Patient's Chart, lab work & pertinent test results  History of Anesthesia Complications Negative for: history of anesthetic complications  Airway Mallampati: II  TM Distance: >3 FB Neck ROM: Full    Dental no notable dental hx.    Pulmonary neg pulmonary ROS   Pulmonary exam normal        Cardiovascular hypertension, Normal cardiovascular exam     Neuro/Psych  Headaches  Anxiety        GI/Hepatic Neg liver ROS,GERD  Medicated,,  Endo/Other  diabetes, Gestational  Class 4 obesity (BMI 57)  Renal/GU negative Renal ROS     Musculoskeletal negative musculoskeletal ROS (+)    Abdominal   Peds  Hematology negative hematology ROS (+)   Anesthesia Other Findings   Reproductive/Obstetrics (+) Pregnancy                              Anesthesia Physical Anesthesia Plan  ASA: 3  Anesthesia Plan: Spinal   Post-op Pain Management: Ofirmev  IV (intra-op)*   Induction:   PONV Risk Score and Plan: 4 or greater and Treatment may vary due to age or medical condition, Ondansetron  and Dexamethasone   Airway Management Planned: Natural Airway  Additional Equipment: None  Intra-op Plan:   Post-operative Plan:   Informed Consent: I have reviewed the patients History and Physical, chart, labs and discussed the procedure including the risks, benefits and alternatives for the proposed anesthesia with the patient or authorized representative who has indicated his/her understanding and acceptance.       Plan Discussed with: CRNA  Anesthesia Plan Comments:          Anesthesia Quick Evaluation  "

## 2024-05-06 ENCOUNTER — Inpatient Hospital Stay (HOSPITAL_COMMUNITY)
Admission: RE | Admit: 2024-05-06 | Discharge: 2024-05-08 | DRG: 784 | Disposition: A | Discharge status: Home / Self Care | Payer: Self-pay | Attending: Family Medicine | Admitting: Family Medicine

## 2024-05-06 ENCOUNTER — Encounter (HOSPITAL_COMMUNITY)
Admission: RE | Disposition: A | Discharge status: Home / Self Care | Payer: Self-pay | Source: Home / Self Care | Attending: Family Medicine

## 2024-05-06 ENCOUNTER — Encounter (HOSPITAL_COMMUNITY): Payer: Self-pay | Admitting: Family Medicine

## 2024-05-06 ENCOUNTER — Inpatient Hospital Stay (HOSPITAL_COMMUNITY): Payer: Self-pay | Admitting: Anesthesiology

## 2024-05-06 ENCOUNTER — Other Ambulatory Visit: Payer: Self-pay

## 2024-05-06 DIAGNOSIS — O4440 Low lying placenta NOS or without hemorrhage, unspecified trimester: Secondary | ICD-10-CM | POA: Diagnosis present

## 2024-05-06 DIAGNOSIS — O34211 Maternal care for low transverse scar from previous cesarean delivery: Principal | ICD-10-CM | POA: Diagnosis present

## 2024-05-06 DIAGNOSIS — Z3A37 37 weeks gestation of pregnancy: Secondary | ICD-10-CM

## 2024-05-06 DIAGNOSIS — O26893 Other specified pregnancy related conditions, third trimester: Secondary | ICD-10-CM | POA: Diagnosis present

## 2024-05-06 DIAGNOSIS — O24410 Gestational diabetes mellitus in pregnancy, diet controlled: Principal | ICD-10-CM | POA: Diagnosis present

## 2024-05-06 DIAGNOSIS — O99214 Obesity complicating childbirth: Secondary | ICD-10-CM | POA: Diagnosis present

## 2024-05-06 DIAGNOSIS — O3403 Maternal care for unspecified congenital malformation of uterus, third trimester: Secondary | ICD-10-CM | POA: Diagnosis present

## 2024-05-06 DIAGNOSIS — Z8249 Family history of ischemic heart disease and other diseases of the circulatory system: Secondary | ICD-10-CM | POA: Diagnosis not present

## 2024-05-06 DIAGNOSIS — O24420 Gestational diabetes mellitus in childbirth, diet controlled: Secondary | ICD-10-CM | POA: Diagnosis present

## 2024-05-06 DIAGNOSIS — D6869 Other thrombophilia: Secondary | ICD-10-CM | POA: Diagnosis present

## 2024-05-06 DIAGNOSIS — O9902 Anemia complicating childbirth: Secondary | ICD-10-CM | POA: Diagnosis present

## 2024-05-06 DIAGNOSIS — O34593 Maternal care for other abnormalities of gravid uterus, third trimester: Secondary | ICD-10-CM

## 2024-05-06 DIAGNOSIS — Z86718 Personal history of other venous thrombosis and embolism: Secondary | ICD-10-CM

## 2024-05-06 DIAGNOSIS — Z302 Encounter for sterilization: Secondary | ICD-10-CM

## 2024-05-06 DIAGNOSIS — Z6841 Body Mass Index (BMI) 40.0 and over, adult: Secondary | ICD-10-CM

## 2024-05-06 DIAGNOSIS — O134 Gestational [pregnancy-induced] hypertension without significant proteinuria, complicating childbirth: Secondary | ICD-10-CM | POA: Diagnosis present

## 2024-05-06 DIAGNOSIS — K219 Gastro-esophageal reflux disease without esophagitis: Secondary | ICD-10-CM | POA: Diagnosis present

## 2024-05-06 DIAGNOSIS — O9912 Other diseases of the blood and blood-forming organs and certain disorders involving the immune mechanism complicating childbirth: Secondary | ICD-10-CM | POA: Diagnosis present

## 2024-05-06 DIAGNOSIS — Q5128 Other doubling of uterus, other specified: Secondary | ICD-10-CM | POA: Diagnosis not present

## 2024-05-06 DIAGNOSIS — O133 Gestational [pregnancy-induced] hypertension without significant proteinuria, third trimester: Secondary | ICD-10-CM | POA: Diagnosis present

## 2024-05-06 DIAGNOSIS — Z6791 Unspecified blood type, Rh negative: Secondary | ICD-10-CM | POA: Diagnosis not present

## 2024-05-06 DIAGNOSIS — O9962 Diseases of the digestive system complicating childbirth: Secondary | ICD-10-CM | POA: Diagnosis present

## 2024-05-06 DIAGNOSIS — Z3490 Encounter for supervision of normal pregnancy, unspecified, unspecified trimester: Secondary | ICD-10-CM

## 2024-05-06 DIAGNOSIS — O0990 Supervision of high risk pregnancy, unspecified, unspecified trimester: Secondary | ICD-10-CM

## 2024-05-06 DIAGNOSIS — Z833 Family history of diabetes mellitus: Secondary | ICD-10-CM | POA: Diagnosis not present

## 2024-05-06 DIAGNOSIS — F4310 Post-traumatic stress disorder, unspecified: Secondary | ICD-10-CM | POA: Diagnosis present

## 2024-05-06 DIAGNOSIS — O139 Gestational [pregnancy-induced] hypertension without significant proteinuria, unspecified trimester: Secondary | ICD-10-CM | POA: Diagnosis present

## 2024-05-06 DIAGNOSIS — Z98891 History of uterine scar from previous surgery: Secondary | ICD-10-CM

## 2024-05-06 LAB — TYPE AND SCREEN
ABO/RH(D): A NEG
Antibody Screen: NEGATIVE

## 2024-05-06 LAB — COMPREHENSIVE METABOLIC PANEL WITH GFR
ALT: 9 U/L (ref 0–44)
AST: 20 U/L (ref 15–41)
Albumin: 3.6 g/dL (ref 3.5–5.0)
Alkaline Phosphatase: 201 U/L — ABNORMAL HIGH (ref 38–126)
Anion gap: 13 (ref 5–15)
BUN: 9 mg/dL (ref 6–20)
CO2: 20 mmol/L — ABNORMAL LOW (ref 22–32)
Calcium: 9.3 mg/dL (ref 8.9–10.3)
Chloride: 103 mmol/L (ref 98–111)
Creatinine, Ser: 0.5 mg/dL (ref 0.44–1.00)
GFR, Estimated: >60 mL/min
Glucose, Bld: 94 mg/dL (ref 70–99)
Potassium: 4.1 mmol/L (ref 3.5–5.1)
Sodium: 136 mmol/L (ref 135–145)
Total Bilirubin: 0.3 mg/dL (ref 0.0–1.2)
Total Protein: 7 g/dL (ref 6.5–8.1)

## 2024-05-06 LAB — CBC
HCT: 36.5 % (ref 36.0–46.0)
Hemoglobin: 12.5 g/dL (ref 12.0–15.0)
MCH: 30.2 pg (ref 26.0–34.0)
MCHC: 34.2 g/dL (ref 30.0–36.0)
MCV: 88.2 fL (ref 80.0–100.0)
Platelets: 428 K/uL — ABNORMAL HIGH (ref 150–400)
RBC: 4.14 MIL/uL (ref 3.87–5.11)
RDW: 13.2 % (ref 11.5–15.5)
WBC: 16.3 K/uL — ABNORMAL HIGH (ref 4.0–10.5)
nRBC: 0 % (ref 0.0–0.2)

## 2024-05-06 LAB — SYPHILIS: RPR W/REFLEX TO RPR TITER AND TREPONEMAL ANTIBODIES, TRADITIONAL SCREENING AND DIAGNOSIS ALGORITHM: RPR Ser Ql: NONREACTIVE

## 2024-05-06 LAB — GLUCOSE, CAPILLARY: Glucose-Capillary: 103 mg/dL — ABNORMAL HIGH (ref 70–99)

## 2024-05-06 MED ORDER — POVIDONE-IODINE 10 % EX SWAB
2.0000 | Freq: Once | CUTANEOUS | Status: AC
  Administered 2024-05-06: 2 via TOPICAL

## 2024-05-06 MED ORDER — SODIUM CHLORIDE 0.9% FLUSH: 3.0000 mL | INTRAVENOUS | Status: DC | PRN

## 2024-05-06 MED ORDER — SOD CITRATE-CITRIC ACID 500-334 MG/5ML PO SOLN
30.0000 mL | ORAL | Status: AC
  Administered 2024-05-06: 30 mL via ORAL

## 2024-05-06 MED ORDER — SODIUM CHLORIDE 0.9 % IR SOLN
Status: DC | PRN
  Administered 2024-05-06: 1

## 2024-05-06 MED ORDER — ONDANSETRON HCL 4 MG/2ML IJ SOLN: 4.0000 mg | Freq: Three times a day (TID) | INTRAMUSCULAR | Status: DC | PRN

## 2024-05-06 MED ORDER — STERILE WATER FOR IRRIGATION IR SOLN
Status: DC | PRN
  Administered 2024-05-06: 1

## 2024-05-06 MED ORDER — ACETAMINOPHEN 10 MG/ML IV SOLN
INTRAVENOUS | Status: DC | PRN
  Administered 2024-05-06: 1000 mg via INTRAVENOUS

## 2024-05-06 MED ORDER — TRANEXAMIC ACID-NACL 1000-0.7 MG/100ML-% IV SOLN
INTRAVENOUS | Status: AC
  Filled 2024-05-06: qty 100

## 2024-05-06 MED ORDER — TRANEXAMIC ACID-NACL 1000-0.7 MG/100ML-% IV SOLN
1000.0000 mg | Freq: Once | INTRAVENOUS | Status: AC
  Administered 2024-05-06: 1000 mg via INTRAVENOUS

## 2024-05-06 MED ORDER — CEFAZOLIN SODIUM-DEXTROSE 3-4 GM/150ML-% IV SOLN
INTRAVENOUS | Status: AC
  Filled 2024-05-06: qty 150

## 2024-05-06 MED ORDER — FENTANYL CITRATE (PF) 100 MCG/2ML IJ SOLN
INTRAMUSCULAR | Status: AC
  Filled 2024-05-06: qty 2

## 2024-05-06 MED ORDER — DIPHENHYDRAMINE HCL 25 MG PO CAPS: 25.0000 mg | ORAL_CAPSULE | ORAL | Status: DC | PRN

## 2024-05-06 MED ORDER — SOD CITRATE-CITRIC ACID 500-334 MG/5ML PO SOLN
ORAL | Status: AC
  Filled 2024-05-06: qty 30

## 2024-05-06 MED ORDER — DIPHENHYDRAMINE HCL 25 MG PO CAPS: 25.0000 mg | ORAL_CAPSULE | Freq: Four times a day (QID) | ORAL | Status: DC | PRN

## 2024-05-06 MED ORDER — OXYTOCIN-SODIUM CHLORIDE 30-0.9 UT/500ML-% IV SOLN
2.5000 [IU]/h | INTRAVENOUS | Status: AC
  Administered 2024-05-06: 2.5 [IU]/h via INTRAVENOUS

## 2024-05-06 MED ORDER — MENTHOL 3 MG MT LOZG: 1.0000 | LOZENGE | OROMUCOSAL | Status: DC | PRN

## 2024-05-06 MED ORDER — KETOROLAC TROMETHAMINE 30 MG/ML IJ SOLN: 30.0000 mg | Freq: Four times a day (QID) | INTRAMUSCULAR | Status: AC | PRN

## 2024-05-06 MED ORDER — FAMOTIDINE 20 MG PO TABS: 20.0000 mg | ORAL_TABLET | Freq: Every day | ORAL | Status: DC | PRN

## 2024-05-06 MED ORDER — KETOROLAC TROMETHAMINE 30 MG/ML IJ SOLN
INTRAMUSCULAR | Status: AC
  Filled 2024-05-06: qty 1

## 2024-05-06 MED ORDER — KETOROLAC TROMETHAMINE 30 MG/ML IJ SOLN
30.0000 mg | Freq: Four times a day (QID) | INTRAMUSCULAR | Status: AC
  Administered 2024-05-06 – 2024-05-07 (×4): 30 mg via INTRAVENOUS
  Filled 2024-05-06 (×4): qty 1

## 2024-05-06 MED ORDER — PRENATAL MULTIVITAMIN CH
1.0000 | ORAL_TABLET | Freq: Every day | ORAL | Status: DC
  Administered 2024-05-07 – 2024-05-08 (×2): 1 via ORAL
  Filled 2024-05-06 (×2): qty 1

## 2024-05-06 MED ORDER — FUROSEMIDE 20 MG PO TABS
20.0000 mg | ORAL_TABLET | Freq: Every day | ORAL | Status: DC
  Administered 2024-05-07 – 2024-05-08 (×2): 20 mg via ORAL
  Filled 2024-05-06 (×2): qty 1

## 2024-05-06 MED ORDER — LACTATED RINGERS IV SOLN: INTRAVENOUS | Status: DC

## 2024-05-06 MED ORDER — DEXAMETHASONE SOD PHOSPHATE PF 10 MG/ML IJ SOLN
INTRAMUSCULAR | Status: AC
  Filled 2024-05-06: qty 1

## 2024-05-06 MED ORDER — DROPERIDOL 2.5 MG/ML IJ SOLN: 0.6250 mg | Freq: Once | INTRAMUSCULAR | Status: DC | PRN

## 2024-05-06 MED ORDER — OXYTOCIN-SODIUM CHLORIDE 30-0.9 UT/500ML-% IV SOLN
INTRAVENOUS | Status: DC | PRN
  Administered 2024-05-06: 999 mL/h via INTRAVENOUS

## 2024-05-06 MED ORDER — COCONUT OIL OIL: 1.0000 | TOPICAL_OIL | Status: DC | PRN

## 2024-05-06 MED ORDER — IBUPROFEN 600 MG PO TABS
600.0000 mg | ORAL_TABLET | Freq: Four times a day (QID) | ORAL | Status: DC
  Administered 2024-05-07 – 2024-05-08 (×3): 600 mg via ORAL
  Filled 2024-05-06 (×3): qty 1

## 2024-05-06 MED ORDER — MORPHINE SULFATE (PF) 0.5 MG/ML IJ SOLN
INTRAMUSCULAR | Status: DC | PRN
  Administered 2024-05-06: 150 ug via INTRATHECAL

## 2024-05-06 MED ORDER — BUPIVACAINE IN DEXTROSE 0.75-8.25 % IT SOLN
INTRATHECAL | Status: DC | PRN
  Administered 2024-05-06: 1.6 mL via INTRATHECAL

## 2024-05-06 MED ORDER — NALOXONE HCL 4 MG/10ML IJ SOLN: 1.0000 ug/kg/h | INTRAVENOUS | Status: DC | PRN

## 2024-05-06 MED ORDER — DEXMEDETOMIDINE HCL IN NACL 80 MCG/20ML IV SOLN
INTRAVENOUS | Status: DC | PRN
  Administered 2024-05-06: 4 ug via INTRAVENOUS

## 2024-05-06 MED ORDER — CEFAZOLIN SODIUM-DEXTROSE 3-4 GM/150ML-% IV SOLN
3.0000 g | INTRAVENOUS | Status: AC
  Administered 2024-05-06: 3 g via INTRAVENOUS

## 2024-05-06 MED ORDER — POTASSIUM CHLORIDE CRYS ER 20 MEQ PO TBCR
20.0000 meq | EXTENDED_RELEASE_TABLET | Freq: Every day | ORAL | Status: DC
  Administered 2024-05-07 – 2024-05-08 (×2): 20 meq via ORAL
  Filled 2024-05-06 (×2): qty 1

## 2024-05-06 MED ORDER — DIBUCAINE (PERIANAL) 1 % EX OINT: 1.0000 | TOPICAL_OINTMENT | CUTANEOUS | Status: DC | PRN

## 2024-05-06 MED ORDER — DEXAMETHASONE SOD PHOSPHATE PF 10 MG/ML IJ SOLN
INTRAMUSCULAR | Status: DC | PRN
  Administered 2024-05-06: 10 mg via INTRAVENOUS

## 2024-05-06 MED ORDER — ENOXAPARIN SODIUM 80 MG/0.8ML IJ SOSY: 75.0000 mg | PREFILLED_SYRINGE | INTRAMUSCULAR | Status: DC

## 2024-05-06 MED ORDER — PHENYLEPHRINE HCL-NACL 20-0.9 MG/250ML-% IV SOLN
INTRAVENOUS | Status: DC | PRN
  Administered 2024-05-06: 60 ug/min via INTRAVENOUS

## 2024-05-06 MED ORDER — OXYTOCIN-SODIUM CHLORIDE 30-0.9 UT/500ML-% IV SOLN
INTRAVENOUS | Status: AC
  Filled 2024-05-06: qty 500

## 2024-05-06 MED ORDER — KETOROLAC TROMETHAMINE 30 MG/ML IJ SOLN
30.0000 mg | Freq: Four times a day (QID) | INTRAMUSCULAR | Status: AC | PRN
  Administered 2024-05-07: 30 mg via INTRAVENOUS
  Filled 2024-05-06: qty 1

## 2024-05-06 MED ORDER — WITCH HAZEL-GLYCERIN EX PADS: 1.0000 | MEDICATED_PAD | CUTANEOUS | Status: DC | PRN

## 2024-05-06 MED ORDER — SIMETHICONE 80 MG PO CHEW: 80.0000 mg | CHEWABLE_TABLET | ORAL | Status: DC | PRN

## 2024-05-06 MED ORDER — SENNOSIDES-DOCUSATE SODIUM 8.6-50 MG PO TABS
2.0000 | ORAL_TABLET | ORAL | Status: DC
  Administered 2024-05-06 – 2024-05-07 (×2): 2 via ORAL
  Filled 2024-05-06 (×2): qty 2

## 2024-05-06 MED ORDER — KETOROLAC TROMETHAMINE 30 MG/ML IJ SOLN
INTRAMUSCULAR | Status: DC | PRN
  Administered 2024-05-06: 30 mg via INTRAVENOUS

## 2024-05-06 MED ORDER — KETOROLAC TROMETHAMINE 30 MG/ML IJ SOLN: 30.0000 mg | Freq: Once | INTRAMUSCULAR | Status: DC

## 2024-05-06 MED ORDER — MORPHINE SULFATE (PF) 0.5 MG/ML IJ SOLN
INTRAMUSCULAR | Status: AC
  Filled 2024-05-06: qty 10

## 2024-05-06 MED ORDER — SIMETHICONE 80 MG PO CHEW
80.0000 mg | CHEWABLE_TABLET | Freq: Three times a day (TID) | ORAL | Status: DC
  Administered 2024-05-07 – 2024-05-08 (×3): 80 mg via ORAL
  Filled 2024-05-06 (×5): qty 1

## 2024-05-06 MED ORDER — ONDANSETRON HCL 4 MG/2ML IJ SOLN
INTRAMUSCULAR | Status: DC | PRN
  Administered 2024-05-06: 4 mg via INTRAVENOUS

## 2024-05-06 MED ORDER — NALOXONE HCL 0.4 MG/ML IJ SOLN: 0.4000 mg | INTRAMUSCULAR | Status: DC | PRN

## 2024-05-06 MED ORDER — DIPHENHYDRAMINE HCL 50 MG/ML IJ SOLN: 12.5000 mg | INTRAMUSCULAR | Status: DC | PRN

## 2024-05-06 MED ORDER — APIXABAN 2.5 MG PO TABS
2.5000 mg | ORAL_TABLET | Freq: Two times a day (BID) | ORAL | Status: DC
  Administered 2024-05-07: 2.5 mg via ORAL
  Filled 2024-05-06 (×2): qty 1

## 2024-05-06 MED ORDER — HYDROCODONE-ACETAMINOPHEN 5-325 MG PO TABS: 1.0000 | ORAL_TABLET | ORAL | Status: DC | PRN

## 2024-05-06 MED ORDER — FENTANYL CITRATE (PF) 100 MCG/2ML IJ SOLN
INTRAMUSCULAR | Status: DC | PRN
  Administered 2024-05-06: 15 ug via INTRATHECAL

## 2024-05-06 MED ORDER — ACETAMINOPHEN 500 MG PO TABS
1000.0000 mg | ORAL_TABLET | Freq: Four times a day (QID) | ORAL | Status: AC
  Administered 2024-05-06 – 2024-05-07 (×4): 1000 mg via ORAL
  Filled 2024-05-06 (×4): qty 2

## 2024-05-06 MED ORDER — ONDANSETRON HCL 4 MG/2ML IJ SOLN
INTRAMUSCULAR | Status: AC
  Filled 2024-05-06: qty 2

## 2024-05-06 MED ORDER — FENTANYL CITRATE (PF) 100 MCG/2ML IJ SOLN
25.0000 ug | INTRAMUSCULAR | Status: DC | PRN
  Administered 2024-05-06: 50 ug via INTRAVENOUS

## 2024-05-06 MED ORDER — PHENYLEPHRINE HCL-NACL 20-0.9 MG/250ML-% IV SOLN
INTRAVENOUS | Status: AC
  Filled 2024-05-06: qty 250

## 2024-05-06 NOTE — Lactation Note (Signed)
 This note was copied from a baby's chart. Lactation Consultation Note  Patient Name: Martha Potts Unijb'd Date: 05/06/2024 Age:32 hours Reason for consult: Initial assessment;1st time breastfeeding;Early term 37-38.6wks;Maternal endocrine disorder;Breastfeeding assistance  P2- MOB would like to offer breast and formula. Infant was due for a feeding, but when LC entered the room, MGM had a bottle of formula in infant's mouth. Infant had only eaten 2 mL, so we decided to latch. LC placed infant on the left breast in the football hold. LC noted that infant has a lingual frenulum that extends close to the tip of the tongue. Infant has great movement of the tongue and LC does not believe that this should cause any feeding issues. LC still provided the physician resource just in case. After a few attempts, infant latched to the left breast with a deep latch and flanged lips. Infant started out chomping, but then switched to nutritive sucking. Infant nursed for 7 minutes with stimulation after falling off smiling. LC did not believe that infant needed any formula, but MOB wanted infant to take at least 5 mL. MGM gave infant more formula until she hit 5 mL. LC encouraged breast first, then formula. LC sent [facility] referral and had FOB order flange inserts appropriate for MOB.  LC reviewed the first 24 hr birthday nap, day 2 cluster feeding, feeding infant on cue 8-12x in 24 hrs, not allowing infant to go over 3 hrs without a feeding, CDC milk storage guidelines, LC services handout and engorgement/breast care. LC encouraged MOB to call for further assistance as needed.  Maternal Data Has patient been taught Hand Expression?: Yes Does the patient have breastfeeding experience prior to this delivery?: No (first child would not latch)  Feeding Mother's Current Feeding Choice: Breast Milk and Formula Nipple Type: Slow - flow  LATCH Score Latch: Repeated attempts needed to sustain latch, nipple held in  mouth throughout feeding, stimulation needed to elicit sucking reflex.  Audible Swallowing: A few with stimulation  Type of Nipple: Everted at rest and after stimulation  Comfort (Breast/Nipple): Soft / non-tender  Hold (Positioning): Full assist, staff holds infant at breast  LATCH Score: 6   Lactation Tools Discussed/Used Pump Education: Milk Storage  Interventions Interventions: Breast feeding basics reviewed;Assisted with latch;Hand express;Breast compression;Adjust position;Support pillows;Position options;Education;LC Services brochure  Discharge Discharge Education: Engorgement and breast care;Warning signs for feeding baby Pump: Referral sent for [facility] Pump  Consult Status Consult Status: Follow-up Date: 05/07/24 Follow-up type: In-patient    Recardo Hoit BS, IBCLC 05/06/2024, 6:36 PM

## 2024-05-06 NOTE — H&P (Signed)
 LABOR AND DELIVERY ADMISSION HISTORY AND PHYSICAL NOTE  Martha Potts is a 34 y.o. female G25P1011 with IUP at [redacted]w[redacted]d presenting for scheduled repeat cesarean.   Patient reports the fetal movement as active. Patient reports uterine contraction  activity as irregular. Patient reports  vaginal bleeding as none. Patient describes fluid per vagina as None.   Patient denies headache, vision changes, chest pain, shortness of breath, right upper quadrant pain, or LE edema.  She plans on bottle feeding feeding. Her contraception plan is: bilateral tubal ligation.  Prenatal History/Complications: PNC at [facility]:  @[redacted]w[redacted]d , CWD, normal anatomy, cephalic presentation, posterior placenta, 89%ile, EFW 2426g  Pregnancy complications:  Patient Active Problem List   Diagnosis Date Noted   Gestational hypertension, third trimester 05/01/2024   Gestational diabetes, diet controlled 03/09/2024   Subchorionic hematoma in second trimester 02/28/2024   Supervision of high risk pregnancy, antepartum 01/13/2024   BMI 50.0-59.9, adult (HCC) 01/13/2024   Anticoagulated 01/13/2024   Low-lying placenta 01/13/2024   Rh negative state in antepartum period 01/13/2024   History of cesarean delivery 01/13/2024   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/13/2024   History of DVT (deep vein thrombosis) 11/05/2023   Chronic tension-type headache, not intractable 08/25/2019   Post traumatic stress disorder 07/30/2018   Maternal morbid obesity, antepartum (HCC) 07/25/2018    Past Medical History: Past Medical History:  Diagnosis Date   Acute cholecystitis due to biliary calculus 04/21/2016   Deep vein phlebitis of upper extremity 09/2023   L arm   Family discord 10/05/2018   Gestational diabetes    Polyp of sigmoid colon    Pregnancy induced hypertension    Rectal bleeding 01/21/2023   Uterus didelphus     Past Surgical History: Past Surgical History:  Procedure Laterality Date   CERVIX SURGERY      CESAREAN SECTION     CHOLECYSTECTOMY N/A 04/22/2016   Procedure: LAPAROSCOPIC CHOLECYSTECTOMY;  Surgeon: Carlin Pastel, MD;  Location: [facility] ORS;  Service: General;  Laterality: N/A;   COLONOSCOPY WITH PROPOFOL  N/A 08/12/2018   Procedure: COLONOSCOPY WITH PROPOFOL ;  Surgeon: [name], MD;  Location: [facility] ENDOSCOPY;  Service: Endoscopy;  Laterality: N/A;  Urgent per Dr. Janalyn   COLONOSCOPY WITH PROPOFOL  N/A 01/21/2023   Procedure: COLONOSCOPY WITH PROPOFOL ;  Surgeon: Unk [name], MD;  Location: [facility] ENDOSCOPY;  Service: Gastroenterology;  Laterality: N/A;    Obstetrical History: OB History     Gravida  3   Para  1   Term  1   Preterm      AB  1   Living  1      SAB  1   IAB      Ectopic      Multiple      Live Births              Social History: Social History   Socioeconomic History   Marital status: Single    Spouse name: Not on file   Number of children: Not on file   Years of education: Not on file   Highest education level: Not on file  Occupational History   Not on file  Tobacco Use   Smoking status: Never   Smokeless tobacco: Never  Vaping Use   Vaping status: Never Used  Substance and Sexual Activity   Alcohol use: No   Drug use: No   Sexual activity: Yes  Other Topics Concern   Not on file  Social History Narrative  Not on file   Social Drivers of Health   Tobacco Use: Low Risk (05/06/2024)   Patient History    Smoking Tobacco Use: Never    Smokeless Tobacco Use: Never    Passive Exposure: Not on file  Financial Resource Strain: Not on file  Food Insecurity: No Food Insecurity (05/06/2024)   Epic    Worried About Programme Researcher, Broadcasting/film/video in the Last Year: Never true    Ran Out of Food in the Last Year: Never true  Transportation Needs: No Transportation Needs (05/06/2024)   Epic    Lack of Transportation (Medical): No    Lack of Transportation (Non-Medical): No  Physical Activity: Not on file  Stress: Not on  file  Social Connections: Not on file  Depression (PHQ2-9): Low Risk (01/17/2024)   Depression (PHQ2-9)    PHQ-2 Score: 0  Alcohol Screen: Not on file  Housing: Unknown (05/06/2024)   Epic    Unable to Pay for Housing in the Last Year: No    Number of Times Moved in the Last Year: Not on file    Homeless in the Last Year: No  Utilities: Not At Risk (05/06/2024)   Epic    Threatened with loss of utilities: No  Health Literacy: Low Risk (09/26/2023)   Received from [facility]   Health Literacy    : Never    Family History: Family History  Problem Relation Age of Onset   Stroke Father    Heart disease Father    Hypertension Father    Hypertension Sister    Cancer Maternal Aunt    Cervical cancer Maternal Aunt    Hypertension Paternal Aunt    Cancer Maternal Grandmother    Hodgkin's lymphoma Maternal Grandmother    Heart disease Paternal Grandfather    Cancer Paternal Grandfather    Lung cancer Paternal Grandfather    Diabetes Other     Allergies: Allergies[1]  Medications Prior to Admission  Medication Sig Dispense Refill Last Dose/Taking   acetaminophen  (TYLENOL ) 500 MG tablet Take 1,000 mg by mouth at bedtime as needed for mild pain (pain score 1-3) or moderate pain (pain score 4-6).   Taking As Needed   aspirin  EC 81 MG tablet Take 1 tablet (81 mg total) by mouth daily. Swallow whole. 90 tablet 5 Past Week   cyclobenzaprine  (FLEXERIL ) 10 MG tablet Take 1 tablet (10 mg total) by mouth 3 (three) times daily as needed for muscle spasms. (Patient taking differently: Take 10 mg by mouth See admin instructions. 1-2 times daily as needed) 30 tablet 2 Past Week   famotidine  (PEPCID ) 20 MG tablet Take 1 tablet (20 mg total) by mouth 2 (two) times daily. (Patient taking differently: Take 20 mg by mouth daily as needed for heartburn.) 60 tablet 3 Past Week   Prenatal Vit-Fe Fumarate-FA (PRENATAL MULTIVITAMIN) TABS tablet Take 1 tablet by mouth daily.   05/05/2024   Accu-Chek  Softclix Lancets lancets Use as instructed 100 each 12    Blood Glucose Monitoring Suppl (ACCU-CHEK GUIDE) w/Device KIT 1 Device by Does not apply route 4 (four) times daily. 1 kit 0    docusate sodium  (COLACE) 100 MG capsule Take 1 capsule (100 mg total) by mouth 2 (two) times daily as needed. (Patient not taking: Reported on 05/05/2024) 30 capsule 2 Not Taking   glucose blood (ACCU-CHEK GUIDE TEST) test strip 1 each by Other route 4 (four) times daily. Use as instructed 100 each 12    metoCLOPramide  (  REGLAN ) 10 MG tablet Take 1 tablet (10 mg total) by mouth every 6 (six) hours as needed (headache and/or nausea). (Patient not taking: Reported on 05/05/2024) 30 tablet 0 Not Taking     Review of Systems  All systems reviewed and negative except as stated in HPI  Physical Exam BP (!) 131/93   Pulse 97   Temp 97.7 F (36.5 C) (Oral)   Resp 18   Ht 5' 5 (1.651 m)   Wt (!) 152.2 kg   LMP 08/13/2023   SpO2 95%   BMI 55.85 kg/m   Physical Exam Constitutional:      General: She is not in acute distress.    Appearance: Normal appearance. She is not ill-appearing.  HENT:     Head: Atraumatic.  Eyes:     General: No scleral icterus.    Conjunctiva/sclera: Conjunctivae normal.  Pulmonary:     Effort: Pulmonary effort is normal.  Skin:    General: Skin is warm and dry.     Coloration: Skin is not jaundiced or pale.  Neurological:     Mental Status: She is alert.     Coordination: Coordination normal.  Psychiatric:        Mood and Affect: Mood normal.        Behavior: Behavior normal.     FHR: 175 bpm by doppler  Prenatal labs: ABO, Rh: --/--/A NEG (10/16 2306) Antibody: Comment, See Final Results (11/04 0845) Rubella: Immune (06/17 0000) RPR: Non Reactive (11/04 0845)  HBsAg: Negative (06/17 0000)  HIV: Non Reactive (11/04 0845)  GC/Chlamydia:  Neisseria Gonorrhea  Date Value Ref Range Status  04/02/2024 Negative  Final   Chlamydia  Date Value Ref Range Status   04/02/2024 Negative  Final   GBS:    Prenatal Transfer Tool  Maternal Diabetes: Yes:  Diabetes Type:  Diet controlled Genetic Screening: Normal Maternal Ultrasounds/Referrals: Normal Fetal Ultrasounds or other Referrals:  None Maternal Substance Abuse:  No Significant Maternal Medications:  None Significant Maternal Lab Results: None  Results for orders placed or performed during the hospital encounter of 05/06/24 (from the past 24 hours)  Glucose, capillary   Collection Time: 05/06/24 11:13 AM  Result Value Ref Range   Glucose-Capillary 103 (H) 70 - 99 mg/dL    Assessment: Martha Potts is a 34 y.o. G3P1011 at [redacted]w[redacted]d here for scheduled cesarean section.  #Repeat Low Transverse Cesarean and Bilateral Tubal Ligation via Bilateral salpingectomy  The risks of cesarean section were discussed with the patient including but were not limited to: bleeding which may require transfusion or reoperation; infection which may require antibiotics; injury to bowel, bladder, ureters or other surrounding organs; injury to the fetus; need for additional procedures including hysterectomy in the event of a life-threatening hemorrhage; placental abnormalities wth subsequent pregnancies, incisional problems, thromboembolic phenomenon and other postoperative/anesthesia complications.  Patient also desires permanent sterilization.  Other reversible forms of contraception were discussed with patient; she declines all other modalities. Risks of procedure discussed with patient including but not limited to: risk of regret, permanence of method, bleeding, infection, injury to surrounding organs and need for additional procedures.  Failure risk of about 1% with increased risk of ectopic gestation if pregnancy occurs was also discussed with patient.  Also discussed possibility of post-tubal pain syndrome. The patient concurred with the proposed plan, giving informed written consent for the procedures.  Patient has  been NPO since last night she will remain NPO for procedure. Anesthesia and OR aware.  Preoperative  prophylactic antibiotics and SCDs ordered on call to the OR.   #Anesthesia: spinal #FWB: FHR 175 bpm by doppler #GBS/ID: Unknown #MOF: bottle feeding #MOC: bilateral tubal ligation #Circ: n/a  #Gestational HTN: mild range BP's on admission, labs pending  #Rh negative: rhogam eval PP  #History of DVT: not on lovenox  during pregnancy, plan for 6 wk PP course, would prefer DOAC, will discuss with pharmacy  #GDMA1: PP 2hr GTT  #Uterine didelphus  Martha CHRISTELLA Carolus, MD/MPH Attending Family Medicine Physician, [facility] for [facility], [facility]  05/06/2024, 11:45 AM     [1] No Known Allergies

## 2024-05-06 NOTE — Lactation Note (Signed)
 This note was copied from a baby's chart. Lactation Consultation Note  Patient Name: Martha Potts Unijb'd Date: 05/06/2024 Age:32 hours Reason for consult: Follow-up assessment;Mother's request;1st time breastfeeding;Early term 37-38.6wks;Breastfeeding assistance;Maternal endocrine disorder  P2- MOB called out for latching assistance. LC assisted with placing infant on the right breast in the football hold. MOB needed LC to hold infant during the entire feeding attempt because she was laid too far back and couldn't position more upright due to incision pain (per MOB). MOB stroked her nipple from infant's nose to chin to stimulate infant. A few times we were able to latch infant, but she would fall asleep immediately. After 10 minutes of attempting, MOB encouraged FOB to formula feed infant. LC reviewed how to pace feed with FOB because this is his first child and he requested assistance. LC praised FOB for his quick learning. LC provided MOB with a manual pump (18mm flange) and had her pump for stimulation when infant is too tired to latch. LC encouraged MOB to call for further assistance as needed.  Maternal Data Has patient been taught Hand Expression?: Yes Does the patient have breastfeeding experience prior to this delivery?: No  Feeding Mother's Current Feeding Choice: Breast Milk and Formula Nipple Type: Slow - flow  LATCH Score Latch: Too sleepy or reluctant, no latch achieved, no sucking elicited.  Audible Swallowing: None  Type of Nipple: Everted at rest and after stimulation  Comfort (Breast/Nipple): Soft / non-tender  Hold (Positioning): Full assist, staff holds infant at breast  LATCH Score: 4   Lactation Tools Discussed/Used Tools: Pump;Flanges Flange Size: 18 Breast pump type: Manual Pump Education: Setup, frequency, and cleaning;Milk Storage Reason for Pumping: MOB request for stimulation Pumping frequency: 15-20 min every 3  hrs  Interventions Interventions: Breast feeding basics reviewed;Assisted with latch;Breast compression;Adjust position;Support pillows;Position options;Hand pump;Education;LC Services brochure;Pace feeding  Discharge Discharge Education: Engorgement and breast care;Warning signs for feeding baby Pump: Referral sent for [facility] Pump  Consult Status Consult Status: Follow-up Date: 05/07/24 Follow-up type: In-patient    Recardo Hoit BS, IBCLC 05/06/2024, 10:20 PM

## 2024-05-06 NOTE — Op Note (Signed)
 Cesarean Section Operative Note   Patient: Martha Potts  Date of Procedure: 05/06/2024  Procedure: Repeat Low Transverse Cesarean and Bilateral Tubal Ligation via Bilateral partial salpingectomy   Indications: patient declines vag del attempt, elective repeat, previous uterine incision: low transverse and undesired fertility  Pre-operative Diagnosis: repeat cesarean section; Undesired fertility.   Post-operative Diagnosis: Same  TOLAC Candidate: n/a  Surgeon: Surgeons and Role:    * Lola Donnice HERO, MD - Primary    * Danny Geralds, DO - MAINE Fellow  An experienced assistant was required given the standard of surgical care given the complexity of the case.  This assistant was needed for exposure, dissection, suctioning, retraction, instrument exchange, assisting with delivery with administration of fundal pressure, and for overall help during the procedure.   Anesthesia: spinal  Anesthesiologist: Howze, Kathryn E, MD   Antibiotics: Cefazolin    Estimated Blood Loss: 334 ml   Total IV Fluids: 2200 ml  Urine Output: 250 cc OF clear urine  Specimens: bilateral fallopian tubes to pathology   Complications: no complications   Indications: Martha Potts is a 34 y.o. [id number] with an IUP [redacted]w[redacted]d presenting for scheduled cesarean secondary to the indications listed above.  The risks of cesarean section were discussed with the patient including but were not limited to: bleeding which may require transfusion or reoperation; infection which may require antibiotics; injury to bowel, bladder, ureters or other surrounding organs; injury to the fetus; need for additional procedures including hysterectomy in the event of a life-threatening hemorrhage; placental abnormalities wth subsequent pregnancies, incisional problems, thromboembolic phenomenon and other postoperative/anesthesia complications.  Patient also desires permanent sterilization.  Other reversible forms of contraception were  discussed with patient; she declines all other modalities. Risks of procedure discussed with patient including but not limited to: risk of regret, permanence of method, bleeding, infection, injury to surrounding organs and need for additional procedures.  Failure risk of about 1% with increased risk of ectopic gestation if pregnancy occurs was also discussed with patient.  Also discussed possibility of post-tubal pain syndrome. The patient concurred with the proposed plan, giving informed written consent for the procedures.  Patient has been NPO since last night she will remain NPO for procedure. Anesthesia and OR aware.  Preoperative prophylactic antibiotics and SCDs ordered on call to the OR.   Findings: Viable infant in cephalic presentation, no nuchal cord present. Apgars 8, 9. Weight 3030 g. Clear amniotic fluid. Normal placenta, three vessel cord. 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 Normal bilateral ovaries. Minimal adhesive disease, primarily some thick bands in the peritoneum that were taken down easily with bovie.  Procedure Details: A Time Out was held and the above information confirmed. The patient received intravenous antibiotics and had sequential compression devices applied to her lower extremities preoperatively. The patient was taken back to the operative suite where spinal anesthesia was administered. After induction of anesthesia, the patient was draped and prepped in the usual sterile manner and placed in a dorsal supine position with a leftward tilt. A low transverse skin incision was made with scalpel and carried down through the subcutaneous tissue to the fascia. Fascial incision was made and extended transversely. The fascia was separated from the underlying rectus tissue superiorly and inferiorly. The rectus muscles were separated in the midline  bluntly and the peritoneum was entered bluntly. An Alexis retractor was placed to aid in  visualization of the uterus. A bladder flap was not developed. A low transverse uterine incision was made. The infant was successfully delivered from cephalic presentation with the aid of Kiwi vacuum with one pull and no pop offs, the umbilical cord was clamped after 1 minute. Cord ph was not sent, and cord blood was obtained for evaluation. The placenta was removed Intact and appeared normal. The uterine incision was closed with a single layer running unlocked suture of 0-Monocryl. Overall, excellent hemostasis was noted.  Attention was then turned to the fallopian tubes. Unfortunately there was significant adhesions of bilateral tubes to the uterine body and ovaries which made total salpingectomy unwise. The left Fallopian tube was identified and then traced to it's fimbriae. Using the Ligasure device and taking care to avoid large vascular structures, the left fallopian tube was removed sequentially starting at the cornua where the most mobility was possible. Approximately 5 cm of tube was removed with excellent hemostasis noted, after which it was not safe to continue removing further tube due to adhesions. Attention was then turned to the right fallopian tube, and after confirmation of identification by tracing the tube out to the fimbriae, the same procedure was then performed with again ~5 cm of tube removed before it became impossible to remove more safely, with excellent hemostasis noted.  The abdomen and pelvis were cleared of all clot and debris and the Thersia was removed. Hemostasis was confirmed on all surfaces.  The peritoneum was reapproximated using 2-0 vicryl . The fascia was then closed using 0 Vicryl in a running fashion. The subcutaneous layer was reapproximated with 2-0 plain gut suture in two layers. The skin was closed with a 4-0 vicryl subcuticular stitch. The patient tolerated the procedure well.  Sponge, lap, instrument and needle counts were correct x 2. She was taken to the recovery room in stable condition.  Disposition: PACU - hemodynamically stable.    Signed: Donnice CHRISTELLA Carolus, MD/MPH Attending Family Medicine Physician, [facility] for [facility], [facility]

## 2024-05-06 NOTE — Transfer of Care (Signed)
 Immediate Anesthesia Transfer of Care Note  Patient: Martha Potts  Procedure(s) Performed: CESAREAN SECTION, WITH BILATERAL TUBAL LIGATION (Bilateral: Abdomen)  Patient Location: PACU  Anesthesia Type:Spinal  Level of Consciousness: awake  Airway & Oxygen Therapy: Patient Spontanous Breathing  Post-op Assessment: Report given to RN and Post -op Vital signs reviewed and stable  Post vital signs: Reviewed and stable  Last Vitals:  Vitals Value Taken Time  BP    Temp    Pulse    Resp    SpO2      Last Pain:  Vitals:   05/06/24 1118  TempSrc: Oral         Complications: No notable events documented.

## 2024-05-06 NOTE — Progress Notes (Signed)
 PHARMACY - ANTICOAGULATION CONSULT NOTE  Pharmacy Consult for Eliquis  initiation post partum Indication: VTE prophylaxis due to DVT in pregnancy  Allergies[1]  Patient Measurements: Height: 5' 5 (165.1 cm) Weight: (!) 152.2 kg (335 lb 9.6 oz) IBW/kg (Calculated) : 57 HEPARIN DW (KG): 95.5  Vital Signs: Temp: 97.7 F (36.5 C) (01/07 1630) Temp Source: Oral (01/07 1630) BP: 128/83 (01/07 1630) Pulse Rate: 78 (01/07 1630)  Labs: Recent Labs    05/06/24 1125  HGB 12.5  HCT 36.5  PLT 428*  CREATININE 0.50    Estimated Creatinine Clearance: 153 mL/min (by C-G formula based on SCr of 0.5 mg/dL).   Medical History: Past Medical History:  Diagnosis Date   Acute cholecystitis due to biliary calculus 04/21/2016   Deep vein phlebitis of upper extremity 09/2023   L arm   Family discord 10/05/2018   Gestational diabetes    Polyp of sigmoid colon    Pregnancy induced hypertension    Rectal bleeding 01/21/2023   Uterus didelphus     Medications:  Scheduled:   [START ON 05/07/2024] acetaminophen   1,000 mg Oral Q6H   [START ON 05/07/2024] apixaban   2.5 mg Oral BID   [START ON 05/07/2024] ketorolac   30 mg Intravenous Q6H   Followed by   [START ON 05/08/2024] ibuprofen   600 mg Oral Q6H   [START ON 05/07/2024] prenatal multivitamin  1 tablet Oral Q1200   senna-docusate  2 tablet Oral Q24H   simethicone   80 mg Oral TID PC   Infusions:   lactated ringers      naloxone  HCl (NARCAN ) 2 mg in dextrose  5 % 250 mL infusion     oxytocin      PRN: coconut oil, witch hazel-glycerin  **AND** dibucaine, diphenhydrAMINE  **OR** diphenhydrAMINE , diphenhydrAMINE , HYDROcodone -acetaminophen , [START ON 05/07/2024] ketorolac  **OR** [START ON 05/07/2024] ketorolac , menthol , naloxone  **AND** sodium chloride  flush, naloxone  HCl (NARCAN ) 2 mg in dextrose  5 % 250 mL infusion, ondansetron  (ZOFRAN ) IV, simethicone   Assessment: Patient with DVT during pregnancy, not requiring DVT prophylaxis post-partum. Patient  will not be breastfeeding and prefers a tablet option.   Goal of Therapy:  Monitor platelets by anticoagulation protocol: Yes   Plan:  Initiate Eliquis  2.5mg  BID for DVT prophylaxis  Martha Potts 05/06/2024,4:47 PM      [1] No Known Allergies

## 2024-05-06 NOTE — Lactation Note (Deleted)
 This note was copied from a baby's chart. Lactation Consultation Note  Patient Name: Martha Potts Date: 05/06/2024 Age:32 hours Reason for consult: Initial assessment  MOB is choosing to formula feed only.  Feeding Mother's Current Feeding Choice: Formula  Consult Status Consult Status: Complete    Recardo Hoit BS, IBCLC 05/06/2024, 3:12 PM

## 2024-05-06 NOTE — Anesthesia Procedure Notes (Signed)
 Spinal  Patient location during procedure: OR Start time: 05/06/2024 1:12 PM End time: 05/06/2024 1:16 PM Reason for block: surgical anesthesia  Staffing Performed: anesthesiologist  Authorized by: Paul Lamarr BRAVO, MD   Performed by: Paul Lamarr BRAVO, MD  Preanesthetic Checklist Completed: patient identified, IV checked, risks and benefits discussed, monitors and equipment checked, pre-op evaluation and timeout performed Spinal Block Patient position: sitting Prep: DuraPrep and site prepped and draped Patient monitoring: heart rate, continuous pulse ox and blood pressure Approach: midline Location: L3-4 Injection technique: single-shot Needle Needle type: Pencan  Needle gauge: 24 G Needle length: 10 cm Assessment Sensory level: T4 Events: CSF return  Additional Notes Risks, benefits, and alternative discussed. Patient gave consent to procedure. Prepped and draped in sitting position. Clear CSF obtained after one needle pass. Positive terminal aspiration. No pain or paraesthesias with injection. Patient tolerated procedure well. Vital signs stable. LANEY Paul, MD

## 2024-05-06 NOTE — Discharge Summary (Addendum)
 "  Postpartum Discharge Summary       Patient Name: Martha Potts DOB: February 10, 1991 MRN: [id number]  Date of admission: 05/06/2024 Delivery date:05/06/2024 Delivering provider: LOLA DONNICE HERO Date of discharge: 05/08/2024  Admitting diagnosis: Encounter for maternal care for low transverse scar from previous cesarean delivery [O34.211] Encounter for sterilization [Z30.2] Pregnancy [Z34.90] Gestational hypertension [O13.9] Intrauterine pregnancy: [redacted]w[redacted]d     Secondary diagnosis:  Principal Problem:   Pregnancy Active Problems:   Maternal morbid obesity, antepartum (HCC)   Post traumatic stress disorder   History of DVT (deep vein thrombosis)   Supervision of high risk pregnancy, antepartum   BMI 50.0-59.9, adult (HCC)   Rh negative state in antepartum period   History of cesarean delivery   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 yo H6E7987 at [redacted]w[redacted]d was admitted to the hospital 05/06/2024 for scheduled cesarean section with the following indication:Elective Repeat, Prior Uterine Surgery, and uterine anomaly.Delivery details are as follows:  Membrane Rupture Time/Date: 1:48 PM,05/06/2024  Delivery Method:C-Section, Vacuum Assisted Operative Delivery: Kiwi vacuum at time of cesarean, head not well engaged at the hysterotomy despite fundal pressure Details of operation can be found in separate operative note.  Patient had a postpartum course complicated by nothing. Bps well controlled, asymptomatic. Home with 6 weeks dvt ppx. She is ambulating, tolerating a regular diet, passing flatus, and urinating well. Patient is  discharged home in stable condition on  05/08/2024        Newborn Data: Birth date:05/06/2024 Birth time:1:50 PM Gender:Female Living status:Living Apgars:8 ,9  Weight:3030 g    Magnesium Sulfate received: No BMZ received: No Rhophylac :No baby rh neg MMR:N/A T-DaP:Given prenatally Flu: Yes RSV Vaccine received: No Transfusion:No  Immunizations received: Immunization History  Administered Date(s) Administered   Influenza,inj,Quad PF,6+ Mos 03/17/2014, 02/08/2020   PPD Test 01/06/2020   Tdap 03/03/2024    Physical exam  Vitals:   05/07/24 0330 05/07/24 1436 05/07/24 2129 05/08/24 0555  BP: 111/70 (!) 140/80 125/76 111/73  Pulse: 75 84 (!) 101 88  Resp: 18 18 18 18   Temp: 98.2 F (36.8 C) 97.7 F (36.5 C) 97.7 F (36.5 C) 98 F (36.7 C)  TempSrc: Oral Oral Oral Oral  SpO2: 99% 100%  99%  Weight:      Height:       General: alert, cooperative, and no distress Lochia: appropriate Uterine Fundus: firm Incision: Dressing is clean, dry, and intact (prevena) DVT Evaluation: No evidence of DVT seen on physical exam.  Labs: Lab Results  Component Value Date   WBC 21.7 (H) 05/07/2024   HGB 10.6 (L) 05/07/2024   HCT 30.8 (L) 05/07/2024   MCV 88.3 05/07/2024   PLT 365 05/07/2024      Latest Ref Rng & Units 05/06/2024   11:25 AM  CMP  Glucose 70 - 99 mg/dL 94   BUN 6 - 20 mg/dL 9   Creatinine 9.55 - 8.99 mg/dL 9.49   Sodium 864 - 854  mmol/L 136   Potassium 3.5 - 5.1 mmol/L 4.1   Chloride 98 - 111 mmol/L 103   CO2 22 - 32 mmol/L 20   Calcium 8.9 - 10.3 mg/dL 9.3   Total Protein 6.5 - 8.1 g/dL 7.0   Total Bilirubin 0.0 - 1.2 mg/dL 0.3   Alkaline Phos 38 - 126 U/L 201   AST 15 - 41 U/L 20   ALT 0 - 44 U/L 9    Edinburgh Score:    05/07/2024    2:43 PM  Edinburgh Postnatal Depression Scale Screening Tool  I have been able to laugh and see the funny side of things. 0  I have looked forward with enjoyment to things. 0  I have blamed myself unnecessarily when  things went wrong. 2  I have been anxious or worried for no good reason. 2  I have felt scared or panicky for no good reason. 2  Things have been getting on top of me. 1  I have been so unhappy that I have had difficulty sleeping. 1  I have felt sad or miserable. 1  I have been so unhappy that I have been crying. 1  The thought of harming myself has occurred to me. 0  Edinburgh Postnatal Depression Scale Total 10   Edinburgh Postnatal Depression Scale Total: (!) 10   After visit meds:  Allergies as of 05/08/2024   No Known Allergies      Medication List     STOP taking these medications    Accu-Chek Guide Test test strip Generic drug: glucose blood   Accu-Chek Guide w/Device Kit   Accu-Chek Softclix Lancets lancets   aspirin  EC 81 MG tablet   metoCLOPramide  10 MG tablet Commonly known as: REGLAN        TAKE these medications    acetaminophen  500 MG tablet Commonly known as: TYLENOL  Take 1,000 mg by mouth at bedtime as needed for mild pain (pain score 1-3) or moderate pain (pain score 4-6).   cyclobenzaprine  10 MG tablet Commonly known as: FLEXERIL  Take 1 tablet (10 mg total) by mouth 3 (three) times daily as needed for muscle spasms. What changed:  when to take this additional instructions   docusate sodium  100 MG capsule Commonly known as: COLACE Take 1 capsule (100 mg total) by mouth 2 (two) times daily as needed.   enoxaparin  80 MG/0.8ML injection Commonly known as: LOVENOX  Inject 0.8 mLs (80 mg total) into the skin daily.   famotidine  20 MG tablet Commonly known as: PEPCID  Take 1 tablet (20 mg total) by mouth 2 (two) times daily. What changed:  when to take this reasons to take this   furosemide  20 MG tablet Commonly known as: LASIX  Take 1 tablet (20 mg total) by mouth daily.   ibuprofen  600 MG tablet Commonly known as: ADVIL  Take 1 tablet (600 mg total) by mouth every 6 (six) hours.   oxyCODONE  5 MG immediate release tablet Commonly known  as: Oxy IR/ROXICODONE  Take 1 tablet (5 mg total) by mouth every 6 (six) hours as needed for severe pain (pain score 7-10) or moderate pain (pain score 4-6).   potassium chloride  SA 20 MEQ tablet Commonly known as: KLOR-CON  M Take 1 tablet (20 mEq total) by mouth daily.   prenatal multivitamin Tabs tablet Take 1 tablet by mouth daily.         Discharge home in stable condition Infant Feeding: Bottle Infant Disposition:home with mother Discharge instruction: per After Visit Summary and Postpartum booklet. Activity:  Advance as tolerated. Pelvic rest for 6 weeks.  Diet: routine diet Future Appointments: Future Appointments  Date Time Provider Department Center  05/13/2024  8:30 AM [facility] LAB [facility]  05/13/2024  9:45 AM [facility] NURSE [facility]  06/17/2024  1:30 PM Letha Renshaw, CNM [facility]   Follow up Visit:  Visits are scheduled  Please schedule this patient for a In person postpartum visit in 6 weeks with the following provider: Any provider. Additional Postpartum F/U:2 hour GTT, Incision check 1 week, and BP check 1 week  High risk pregnancy complicated by: GDM, HTN, Bleeding disorder on Lovenox , and uterine anomaly Delivery mode:  C-Section, Vacuum Assisted Anticipated Birth Control:  BTL intraoperatively    05/08/2024 Devaughn KATHEE Ban, MD    "

## 2024-05-07 ENCOUNTER — Encounter (HOSPITAL_COMMUNITY): Payer: Self-pay | Admitting: Family Medicine

## 2024-05-07 ENCOUNTER — Other Ambulatory Visit (HOSPITAL_COMMUNITY): Payer: Self-pay

## 2024-05-07 ENCOUNTER — Telehealth (HOSPITAL_COMMUNITY): Payer: Self-pay

## 2024-05-07 LAB — CBC
HCT: 30.8 % — ABNORMAL LOW (ref 36.0–46.0)
Hemoglobin: 10.6 g/dL — ABNORMAL LOW (ref 12.0–15.0)
MCH: 30.4 pg (ref 26.0–34.0)
MCHC: 34.4 g/dL (ref 30.0–36.0)
MCV: 88.3 fL (ref 80.0–100.0)
Platelets: 365 K/uL (ref 150–400)
RBC: 3.49 MIL/uL — ABNORMAL LOW (ref 3.87–5.11)
RDW: 13 % (ref 11.5–15.5)
WBC: 21.7 K/uL — ABNORMAL HIGH (ref 4.0–10.5)
nRBC: 0 % (ref 0.0–0.2)

## 2024-05-07 MED ORDER — LACTATED RINGERS IV BOLUS
500.0000 mL | Freq: Once | INTRAVENOUS | Status: AC
  Administered 2024-05-07: 500 mL via INTRAVENOUS

## 2024-05-07 MED ORDER — FERROUS SULFATE 325 (65 FE) MG PO TABS
325.0000 mg | ORAL_TABLET | ORAL | Status: DC
  Administered 2024-05-07: 325 mg via ORAL
  Filled 2024-05-07: qty 1

## 2024-05-07 MED ORDER — OXYCODONE HCL 5 MG PO TABS
5.0000 mg | ORAL_TABLET | Freq: Four times a day (QID) | ORAL | Status: DC | PRN
  Administered 2024-05-07: 5 mg via ORAL
  Administered 2024-05-07 – 2024-05-08 (×2): 10 mg via ORAL
  Filled 2024-05-07 (×4): qty 2

## 2024-05-07 MED ORDER — ACETAMINOPHEN 325 MG PO TABS
650.0000 mg | ORAL_TABLET | Freq: Four times a day (QID) | ORAL | Status: DC | PRN
  Administered 2024-05-07 – 2024-05-08 (×3): 650 mg via ORAL
  Filled 2024-05-07 (×3): qty 2

## 2024-05-07 MED ORDER — ENOXAPARIN SODIUM 80 MG/0.8ML IJ SOSY
80.0000 mg | PREFILLED_SYRINGE | INTRAMUSCULAR | Status: DC
  Administered 2024-05-07: 80 mg via SUBCUTANEOUS
  Filled 2024-05-07: qty 0.8

## 2024-05-07 NOTE — Telephone Encounter (Signed)
 Pharmacy Patient Advocate Encounter  Insurance verification completed.    The patient is insured through [facility] MEDICAID.     Ran test claim for Eliquis  5mg  tablet and the current 30 day co-pay is $0.   This test claim was processed through [employer]- copay amounts may vary at other pharmacies due to [employer], or as the patient moves through the different stages of their insurance plan.

## 2024-05-07 NOTE — Lactation Note (Signed)
 This note was copied from a baby's chart. Lactation Consultation Note  Patient Name: Martha Potts Unijb'd Date: 05/07/2024 Age:34 years Reason for consult: Follow-up assessment;Mother's request;Difficult latch;Early term 37-38.6wks, infant with loss -4%., C/S delivery  See LC previous notes  earlier today regarding Breastfeeding and Medications with Mother's milk.   P2, MOB requested latch assistance, MOB did reverse pressure softening, latch infant on her left breast with pillow support using the cross cradle hold, infant was still breastfeeding after 8 minutes when LC left the room. MOB will continue to breastfeed infant by cues, on demand, 8-12 times within 24 hours. MOB is breast and supplementing infant with formula. LC encouraged MOB latch infant first every feeding and afterwards supplement infant with formula. MOB has handout Supplementing with Breastfeeding.   Maternal Data    Feeding Mother's Current Feeding Choice: Breast Milk and Formula  LATCH Score Latch: Grasps breast easily, tongue down, lips flanged, rhythmical sucking.  Audible Swallowing: A few with stimulation  Type of Nipple: Flat ( MOB did reverse pressure soften which evert nipple shaft out more prior to infant's latch)  Comfort (Breast/Nipple): Soft / non-tender  Hold (Positioning): Assistance needed to correctly position infant at breast and maintain latch.  LATCH Score: 7   Lactation Tools Discussed/Used    Interventions Interventions: Skin to skin;Assisted with latch;Support pillows;Position options;Expressed milk;Adjust position;Breast compression;Education  Discharge    Consult Status Consult Status: Follow-up Date: 05/08/24 Follow-up type: In-patient    Grayce LULLA Batter 05/07/2024, 10:31 PM

## 2024-05-07 NOTE — Lactation Note (Addendum)
 This note was copied from a baby's chart. Lactation Consultation Note  Patient Name: Girl Amatullah Christy Unijb'd Date: 05/07/2024 Age:32 hours  Reason for consult: Follow-up assessment;Early term 37-38.6wks  P2, [redacted]w[redacted]d, 4% weight loss, GDM  Mother was welcoming and receptive to [facility] follow up visit. She states that baby girl  [name] has been latching well. Mother says she can see colostrum and has been leaking for several weeks. Mother wants to breast and formula feed but she has been very happy with breastfeeding.   Discussed early term feeding behaviors. Encouraged mother to latch baby with feeding cues or at least every 3 hrs.Watch baby will feeding and use breast compression to keep baby actively feeding. Mother is supplementing by bottle.  Encouraged to have father bottle feed [name] and mother to pump for additional breast stimulation to promote milk supply. She has a hand pump and she was given her Spectra  pump. Parents have observed the video how to use the Spectra  pump. Mother encouraged to pump every 3 hrs for 15 min.with her pump of choice. Mother was also informed that she can request to use the hospital DEBP that is in her room.    Discussed the process of milk production, supply and demand and the importance of breast stimulation and milk removal in order to make an optimal milk supply.  Discussed mother to breastfeed 8-12 times in 24 hours, skin to skin and breast feed before formula feeding.   If missed feedings at breast or substituting feeding with formula, advised to hand express and/or pump to remove milk from the breast.   Call for assistance with latching baby or pumping as needed.  Feeding Mother's Current Feeding Choice: Breast Milk and Formula Nipple Type: Slow - flow   Lactation Tools Discussed/Used Breast pump type: Manual Reason for Pumping: encouraged pumping for stimulation of milk supply Pumping frequency: every 3 hrs for 15 min if baby is latching and  feeding well at breast  Interventions Interventions: Breast feeding basics reviewed;Education  Discharge Pump: Received Stork Pump (Spectra  pump taken to room, reciept signed)  Consult Status Consult Status: Follow-up Date: 05/08/24 Follow-up type: In-patient    Joshua Line M 05/07/2024, 1:21 PM

## 2024-05-07 NOTE — Lactation Note (Signed)
 This note was copied from a baby's chart. Lactation Consultation Note  Patient Name: Martha Potts Unijb'd Date: 05/07/2024 Age:32 hours  Reason for consult: Follow-up assessment;Early term 37-38.6wks;Breastfeeding assistance;Mother's request  Return to room at mother's request for assistance with breastfeeding. Baby is alert. Assisted mother in football hold with pillow aligned to her breast. Mother has soft, full breast and suggested mother rest her breast on the pillow and rest baby beside her breast. Baby latched well and suckled intermittently, requiring assistance to re-latch. Mother was able to express colostrum and baby was sucking. Mother agreeable to dad helping with latching baby to breast using the teacup hold.   Following breastfeeding and when baby was did interested after 10 minutes, parents were taught how to pace bottle feed. Parents have brought bottles from home that have a wide base or hold larger volumes. Baby was having a difficult time removing milk from the bottle. Small colostrum bottle with Nfant white slow flow nipple was used and baby tolerated much better, Parents were taught how to hold baby in side lying position and pace bottle feed. [name] took 12 mL.   Mother was receptive to pumping. Mother fitted with 18 mm flanges and pump set up at bedside. Mother fell asleep and snoring. Basics reviewed with dad and informed her nurse can educate mother when she wakes up.   Mother encouraged to pump while dad formula feeds baby. Latch baby with feeding cues. Request assistance as needed..  Feeding Mother's Current Feeding Choice: Breast Milk and Formula Nipple Type: Slow - flow  LATCH Score Latch: Repeated attempts needed to sustain latch, nipple held in mouth throughout feeding, stimulation needed to elicit sucking reflex.  Audible Swallowing: A few with stimulation  Type of Nipple: Flat  Comfort (Breast/Nipple): Soft / non-tender  Hold (Positioning):  Assistance needed to correctly position infant at breast and maintain latch.  LATCH Score: 6   Lactation Tools Discussed/Used Tools: Pump;Flanges;Bottle Flange Size: 18 Breast pump type: Double-Electric Breast Pump Pump Education: Setup, frequency, and cleaning;Milk Storage Reason for Pumping: to stimulate milk production and supplement Pumping frequency: every 3 hrs for 15 min in the initiation setting  Interventions Interventions: Breast feeding basics reviewed;Assisted with latch;Skin to skin;Hand express;Breast compression;Adjust position;Support pillows;Education;Pace feeding;DEBP      Consult Status Consult Status: Follow-up Date: 05/08/24 Follow-up type: In-patient    Joshua Line M 05/07/2024, 5:33 PM

## 2024-05-07 NOTE — Progress Notes (Signed)
 CNM Luke Gentry made aware of pts low urine output and tea colored urine at pts 12hr mark w/ foley in. Luke Gentry ordered a 500ml bolus of LR, and advised RNs to notify MD if it isn't at least 30cc/hour in the 1-2 hours after it finishes.  Valari Taylor 05/08/23 0430

## 2024-05-07 NOTE — Anesthesia Postprocedure Evaluation (Signed)
"   Anesthesia Post Note  Patient: Martha Potts  Procedure(s) Performed: CESAREAN SECTION, WITH BILATERAL TUBAL LIGATION (Bilateral: Abdomen)     Patient location during evaluation: PACU Anesthesia Type: Spinal Level of consciousness: awake and alert Pain management: pain level controlled Vital Signs Assessment: post-procedure vital signs reviewed and stable Respiratory status: spontaneous breathing, nonlabored ventilation and respiratory function stable Cardiovascular status: blood pressure returned to baseline Postop Assessment: no apparent nausea or vomiting, spinal receding, no headache and no backache Anesthetic complications: no   No notable events documented.           Vertell Row      "

## 2024-05-07 NOTE — Progress Notes (Signed)
 POSTPARTUM PROGRESS NOTE  POD #1  Subjective:  Martha Potts is a 34 y.o. H6E7987 s/p rLTCS at [redacted]w[redacted]d.  She reports she doing well. No acute events overnight. She reports she is doing well. She denies any problems with ambulating, or po intake. She was having tea colored urine so foley catheter still in place.  Denies nausea or vomiting. She has not passed flatus. Pain is well controlled.  Lochia is normal.  Objective: Blood pressure 111/70, pulse 75, temperature 98.2 F (36.8 C), temperature source Oral, resp. rate 18, height 5' 5 (1.651 m), weight (!) 152.2 kg, last menstrual period 08/13/2023, SpO2 99%, unknown if currently breastfeeding.  Physical Exam:  General: alert, cooperative and no distress Chest: no respiratory distress Heart:regular rate, distal pulses intact Abdomen: soft, nontender,  Uterine Fundus: firm, appropriately tender DVT Evaluation: No calf swelling or tenderness Extremities: Trace edema Skin: warm, dry; incision clean/dry/intact w/ prevena wound vac in place  Recent Labs    05/06/24 1125 05/07/24 0421  HGB 12.5 10.6*  HCT 36.5 30.8*    Assessment/Plan: Martha Potts is a 34 y.o. H6E7987 s/p rLTCS at [redacted]w[redacted]d for elective repeat.  POD#1 - Doing welll; pain well controlled. H/H appropriate  Routine postpartum care  OOB, ambulated  Lovenox  for VTE prophylaxis Anemia: asymptomatic; Start po ferrous sulfate   Contraception: BTS (completed Feeding: Formula gHTN: BP's reviewed, normotensive, continue Lasix  and Kcl Rh negative: Infant is Rh negative History of DVT: Not on lovenox  during pregnancy. Per pharmacy recommendations will be on Eliquis  2.5mg  BID for DVT ppx. Continue for 6weeks PP   Dispo: Plan for discharge in the next 24-48 horus.   LOS: 1 day   Barkley Angles, MD OB Fellow, Faculty Practice [facility], [facility] [employer]

## 2024-05-07 NOTE — Lactation Note (Signed)
 This note was copied from a baby's chart. Lactation Consultation Note  Patient Name: Martha Potts Date: 05/07/2024 Age:32 hours  Received call from [facility] Pharmacist after seeing [name] and writing my note regarding her feeding plan for her baby. Mother is breastfeeding and intending to breast and formula feed.  Mother has a history of DVT 10/2023 and per pharmacist., mother does not want to take Lovenox  injections. Plan is for mother to continue on anticoagulants for 6 weeks postpartum.   Per pharmacy, Eliquis  po is the medication that has been discussed and mother had informed her OB that she is only formula feeding her baby. Per pharmacy and [facility] and Mother's Milk 2025-2026, Eliquis  is categorized as a L4 (limited data, potentially hazardous) due to medication entering human milk. Baby should be watched for bruising on the skin, blood in vomit, stool and urine. Alternative drug with lower degree of transfer should  be considered until more data in lactation is available.   Since starting this note, Pharmacist has talked with mother and she wants to try to breastfeed. Mother aware of risk to baby with Eliquis  and she has decided to continue with breastfeeding and take Lovenox  injections.    Consult Status Consult Status: Follow-up Date: 05/08/24 Follow-up type: In-patient    Joshua Line M 05/07/2024, 2:31 PM

## 2024-05-08 ENCOUNTER — Other Ambulatory Visit (HOSPITAL_COMMUNITY): Payer: Self-pay

## 2024-05-08 LAB — SURGICAL PATHOLOGY

## 2024-05-08 MED ORDER — ENOXAPARIN SODIUM 80 MG/0.8ML IJ SOSY
80.0000 mg | PREFILLED_SYRINGE | INTRAMUSCULAR | 0 refills | Status: AC
  Filled 2024-05-08: qty 24, 30d supply, fill #0

## 2024-05-08 MED ORDER — FUROSEMIDE 20 MG PO TABS
20.0000 mg | ORAL_TABLET | Freq: Every day | ORAL | 0 refills | Status: AC
  Filled 2024-05-08: qty 5, 5d supply, fill #0

## 2024-05-08 MED ORDER — OXYCODONE HCL 5 MG PO TABS
5.0000 mg | ORAL_TABLET | Freq: Four times a day (QID) | ORAL | 0 refills | Status: AC | PRN
  Filled 2024-05-08: qty 15, 4d supply, fill #0

## 2024-05-08 MED ORDER — IBUPROFEN 600 MG PO TABS
600.0000 mg | ORAL_TABLET | Freq: Four times a day (QID) | ORAL | 0 refills | Status: AC
  Filled 2024-05-08: qty 30, 8d supply, fill #0

## 2024-05-08 MED ORDER — POTASSIUM CHLORIDE CRYS ER 20 MEQ PO TBCR
20.0000 meq | EXTENDED_RELEASE_TABLET | Freq: Every day | ORAL | 0 refills | Status: AC
  Filled 2024-05-08: qty 5, 5d supply, fill #0

## 2024-05-08 MED ORDER — OXYCODONE HCL 5 MG PO TABS
5.0000 mg | ORAL_TABLET | ORAL | Status: DC | PRN
  Administered 2024-05-08 (×3): 10 mg via ORAL
  Filled 2024-05-08 (×2): qty 2

## 2024-05-08 NOTE — Patient Instructions (Signed)

## 2024-05-08 NOTE — Social Work (Signed)
 CSW received consult for hx of PTSD and an Edinburgh Postnatal Depression Score score of 10.  CSW met with MOB to offer support and complete assessment.  CSW entered the room and observed MOB resting in bed holding the infant and FOB at bedside. CSW introduced self, CSW role and reason for visit, MOB was agreeable to visit and allowed FOB to remain in the room. CSW inquired about how MOB was feeling, MOB reported good just tired. CSW inquired about MOB MH hx, MOB reported she has been diagnosed with Anxiety, Depression and Bipolar, MOB reported she was taking Effexor and Latuda to manage her MH. MB reported she stopped taking the medication but plans to restart the medication now that she has delivered. CSW provided education regarding the baby blues period vs. perinatal mood disorders, discussed treatment and gave resources for mental health follow up if concerns arise.  CSW recommends self-evaluation during the postpartum time period using the New Mom Checklist from Postpartum Progress and encouraged MOB to contact a medical professional if symptoms are noted at any time.  MOB identified FOB and her family as her primary supports.   CSW provided review of Sudden Infant Death Syndrome (SIDS) precautions.  MOB reported she has all essential items for the infant including a bassinet, crib and car seat CSW identifies no further need for intervention and no barriers to discharge at this time.  Fredricka Kohrs, LCSWA Clinical Social Worker [phone]

## 2024-05-08 NOTE — Lactation Note (Signed)
 This note was copied from a baby's chart. Lactation Consultation Note  Patient Name: Martha Potts Unijb'd Date: 05/08/2024 Age:32 hours  Reason for consult: Follow-up assessment;Early term 37-38.6wks  P2, [redacted]w[redacted]d, ETI, 7% weight loss  Mother reports she breastfeeding baby and supplementing with formula until her full milk comes in. She feels she will be more comfortable working on pumping and breastfeeding once home. Mother has a breast pump and aware of OP LC services and support at [facility] and in the community.    Discussed the process of milk production, supply and demand and the importance of breast stimulation and milk removal in order to make an optimal milk supply.  Discussed mother to breastfeed 8-12 times in 24 hours, skin to skin and breast feed before formula feeding.   If missed feedings at breast or substituting feeding with formula, advised to hand express and/or pump to remove milk from the breast.    Feeding Mother's Current Feeding Choice: Breast Milk and Formula     Interventions Interventions: Breast feeding basics reviewed;Education  Discharge Discharge Education: Engorgement and breast care;Warning signs for feeding baby;Outpatient recommendation Pump: Received Stork Pump;Manual;DEBP  Consult Status Consult Status: Complete Date: 05/08/24    Joshua Rojelio HERO 05/08/2024, 2:12 PM

## 2024-05-11 ENCOUNTER — Encounter (HOSPITAL_COMMUNITY)
Admission: RE | Admit: 2024-05-11 | Discharge: 2024-05-11 | Disposition: A | Discharge status: Home / Self Care | Source: Ambulatory Visit

## 2024-05-11 ENCOUNTER — Telehealth (HOSPITAL_COMMUNITY): Payer: Self-pay | Admitting: *Deleted

## 2024-05-11 DIAGNOSIS — Z1331 Encounter for screening for depression: Secondary | ICD-10-CM

## 2024-05-11 HISTORY — DX: Gestational (pregnancy-induced) hypertension without significant proteinuria, unspecified trimester: O13.9

## 2024-05-11 NOTE — Telephone Encounter (Signed)
 Hospital inpatient EPDS=10. Patient answered never to question #10. CSW consult completed during stay. Ambulatory IBH referral made now, per protocol, and Dr. Izell notified via chart. Stephanee Barcomb, RN, 05/11/24, [phone]

## 2024-05-12 ENCOUNTER — Encounter: Admitting: Obstetrics & Gynecology

## 2024-05-12 ENCOUNTER — Other Ambulatory Visit

## 2024-05-13 ENCOUNTER — Ambulatory Visit

## 2024-05-13 ENCOUNTER — Encounter

## 2024-05-13 ENCOUNTER — Telehealth: Payer: Self-pay | Admitting: Clinical

## 2024-05-13 ENCOUNTER — Other Ambulatory Visit

## 2024-05-13 VITALS — BP 147/103 | HR 80

## 2024-05-13 DIAGNOSIS — Z98891 History of uterine scar from previous surgery: Secondary | ICD-10-CM

## 2024-05-13 DIAGNOSIS — O0990 Supervision of high risk pregnancy, unspecified, unspecified trimester: Secondary | ICD-10-CM

## 2024-05-13 DIAGNOSIS — Z4889 Encounter for other specified surgical aftercare: Secondary | ICD-10-CM | POA: Diagnosis not present

## 2024-05-13 DIAGNOSIS — O133 Gestational [pregnancy-induced] hypertension without significant proteinuria, third trimester: Secondary | ICD-10-CM

## 2024-05-13 DIAGNOSIS — O24410 Gestational diabetes mellitus in pregnancy, diet controlled: Secondary | ICD-10-CM

## 2024-05-13 DIAGNOSIS — Z86718 Personal history of other venous thrombosis and embolism: Secondary | ICD-10-CM

## 2024-05-13 DIAGNOSIS — O34593 Maternal care for other abnormalities of gravid uterus, third trimester: Secondary | ICD-10-CM

## 2024-05-13 DIAGNOSIS — Z6841 Body Mass Index (BMI) 40.0 and over, adult: Secondary | ICD-10-CM

## 2024-05-13 MED ORDER — NYSTATIN 100000 UNIT/GM EX POWD: 1.0000 | Freq: Three times a day (TID) | CUTANEOUS | 2 refills | Status: AC

## 2024-05-13 MED ORDER — NIFEDIPINE ER OSMOTIC RELEASE 30 MG PO TB24: 30.0000 mg | ORAL_TABLET | Freq: Every day | ORAL | 2 refills | Status: AC

## 2024-05-13 NOTE — Telephone Encounter (Signed)
Attempt call regarding referral; Left HIPPA-compliant message to call back Mikle Sternberg from [facility] at  [phone] (Ashelyn Mccravy's office).    

## 2024-05-13 NOTE — Progress Notes (Signed)
 Subjective:  Martha Potts is a 34 y.o. female here for postpartum BP check and incision check following repeat cesarean section.   Hypertension ROS: no chest pain on exertion, no dyspnea on exertion, and no swelling of ankles.   Pt reports incision covered with wound vac.  Objective:  BP (!) 147/103   Pulse 80   LMP 08/13/2023   Appearance alert, well appearing, and in no distress. Incision healing well   Assessment:   Blood Pressure today in office needs improvement.  Incision healed nicely, no signs of infection, scar looks good, does have yeast rash around her lower abdomen.  Plan:  Wound care discussed.  MD made aware of BP today, will send in BP medication and medication for yeast. Pt to follow up in one week to recheck her BP.   Wanda Buckles, RN .

## 2024-05-15 ENCOUNTER — Telehealth: Payer: Self-pay | Admitting: Clinical

## 2024-05-15 DIAGNOSIS — F3160 Bipolar disorder, current episode mixed, unspecified: Secondary | ICD-10-CM

## 2024-05-15 DIAGNOSIS — F4310 Post-traumatic stress disorder, unspecified: Secondary | ICD-10-CM

## 2024-05-15 NOTE — BH Specialist Note (Unsigned)
 Behavioral Health Treatment Plan   Name:Martha Potts  MRN: [id number]   Treatment Plan Development Date: ***   Strengths: {BH Strengths:210964339}  Supports: {BH Supports:210964340}   Client Statement of Needs: ***   Treatment Level:***  Client Treatment Preferences:***   {BH Diagnoses:210964321}   Expected duration of treatment: ***  Party responsible for implementation of interventions: ***.   This plan has been reviewed and created by the following participants: ***   A new plan will be created at least every 12 months.  The patient fully participated in the development of treatment plan with the clinician and verbally consents to such treatment.   Patient Treatment Plan Signature Obtained: {BH Signature Options:210964341}   Wynona Duhamel C Teriana Danker, LCSW p

## 2024-05-15 NOTE — BH Specialist Note (Unsigned)
"     Integrated Behavioral Health via Telemedicine Visit  05/19/2024 Martha Potts [id number]  Number of Integrated Behavioral Health Clinician visits: 1- Initial Visit  Session Start time: [phone]   Session End time: 1023  Total time in minutes: 40    Referring Provider: Bebe Furry, MD Patient/Family location: [facility] Provider location: Center for [facility]  All persons participating in visit: [name] and Martha Potts   Types of Service: Individual psychotherapy and Video visit  I connected with Martha Potts and/or Martha Potts n/a via  Telephone or Video Enabled Telemedicine Application  (Video is Caregility application) and verified that I am speaking with the correct person using two identifiers. Discussed confidentiality: Yes   I discussed the limitations of telemedicine and the availability of in person appointments.  Discussed there is a possibility of technology failure and discussed alternative modes of communication if that failure occurs.  I discussed that engaging in this telemedicine visit, they consent to the provision of behavioral healthcare and the services will be billed under their insurance.  Patient and/or legal guardian expressed understanding and consented to Telemedicine visit: Yes   Presenting Concerns: Patient and/or family reports the following symptoms/concerns: Pt wants to prevent postpartum depression, as experienced with her previous pregnancy, as well as referral to psychiatry for [facility] medication management. Currently she's experiencing increased anxiety, worry, and sadness(attributes to remembering nephew passing due to SIDS seven years ago). Pt has good support at home, sleeping and eating well,  and begins a six-session postpartum-focused brief therapy through a grant.  Duration of problem: Postpartum increase; Severity of problem: moderate  Patient and/or Family's  Strengths/Protective Factors: Social connections and Concrete supports in place (healthy food, safe environments, etc.)  Goals Addressed: Patient will:  Maintain reduced symptoms of: anxiety, depression, and mood instability   Increase knowledge and/or ability of: healthy habits   Demonstrate ability to: Increase healthy adjustment to current life circumstances and Increase adequate support systems for patient/family  Progress towards Goals: Ongoing    Interventions: Interventions utilized:  Psychoeducation and/or Health Education, Link to [employer], and Supportive Reflection Standardized Assessments completed: Not Needed    Patient and/or Family Response: Patient agrees with treatment plan.   Clinical Assessment/Diagnosis  Bipolar affective disorder, current episode mixed, current episode severity unspecified (HCC) - Plan: Ambulatory referral to Behavioral Health .   Patient may benefit from psychoeducation and brief therapeutic interventions regarding coping with symptoms of depression and anxiety.  Plan: Follow up with behavioral health clinician on : Call Martha Potts at [phone], as needed. Behavioral recommendations:  -Accept referral to psychiatry; use [facility] walk-in hours as needed and discussed --Continue prioritizing healthy self-care (regular meals, adequate rest; allowing practical help from supportive friends and family) until at least postpartum medical appointment -Consider new mom support group as needed at either www.[email or web address].net  or [email or web address]  -Continue to begin with new therapist at next week's scheduled appointment  Referral(s): Integrated [employer] (In Clinic) and [facility] (LME/Outside Clinic)  I discussed the assessment and treatment plan with the patient and/or parent/guardian. They were provided an opportunity to ask questions and all were answered. They agreed with the plan and demonstrated  an understanding of the instructions.   They were advised to call back or seek an in-person evaluation if the symptoms worsen or if the condition fails to improve as anticipated.  Martha Dimaria C Benen Weida, LCSW "

## 2024-05-19 ENCOUNTER — Ambulatory Visit: Admitting: *Deleted

## 2024-05-19 ENCOUNTER — Ambulatory Visit: Payer: Self-pay

## 2024-05-19 VITALS — BP 131/88 | HR 91 | Wt 332.0 lb

## 2024-05-19 DIAGNOSIS — Z98891 History of uterine scar from previous surgery: Secondary | ICD-10-CM

## 2024-05-19 DIAGNOSIS — O165 Unspecified maternal hypertension, complicating the puerperium: Secondary | ICD-10-CM

## 2024-05-19 DIAGNOSIS — F3160 Bipolar disorder, current episode mixed, unspecified: Secondary | ICD-10-CM

## 2024-05-19 NOTE — Patient Instructions (Signed)
 Center for [facility] for Women [address] [place], [place] [address] [phone] (main office) [phone] [facility] office)  New Parent Support Groups www.[email or web address].net [email or web address]   [facility]  [address], [place], [place] [address] [phone] or [phone] [facility] 24/7 FOR ANYONE [address], [place], [place]  [phone] Fax: [phone] [email or web address] *Interpreters available *Accepts all insurance and uninsured for Urgent Care needs *Accepts Medicaid and uninsured for outpatient treatment (below)    ONLY FOR [facility]  Below:   Outpatient New Patient Assessment/Therapy Walk-ins:        Monday -Thursday 8am until slots are full.        Every Friday 1pm-4pm  (first come, first served)                   New Patient Psychiatry/Medication Management        Monday-Friday 8am-11am (first come, first served)              For all walk-ins we ask that you arrive by 7:15am, because patients will be seen in the order of arrival.

## 2024-05-19 NOTE — Progress Notes (Signed)
 Subjective:  Martha Potts is a 34 y.o. female here for BP check.   Hypertension ROS: Patient denies any headaches, visual symptoms, RUQ/epigastric pain or other concerning symptoms.  Objective:  LMP 08/13/2023   BP 131/88   Pulse 91   Wt (!) 332 lb (150.6 kg)   LMP 08/13/2023   BMI 55.25 kg/m   Appearance alert, well appearing, and in no distress. General exam BP noted to be improved today in office.    Assessment:   Blood Pressure stable and improved.   Plan:  Current treatment plan is effective, no change in therapy. Follow up for PP appt.

## 2024-05-28 ENCOUNTER — Ambulatory Visit (HOSPITAL_COMMUNITY)
Admission: EM | Admit: 2024-05-28 | Discharge: 2024-05-28 | Disposition: A | Discharge status: Home / Self Care | Attending: Psychiatry | Admitting: Psychiatry

## 2024-05-28 DIAGNOSIS — F411 Generalized anxiety disorder: Secondary | ICD-10-CM | POA: Insufficient documentation

## 2024-05-28 DIAGNOSIS — F39 Unspecified mood [affective] disorder: Secondary | ICD-10-CM

## 2024-05-28 DIAGNOSIS — O906 Postpartum mood disturbance: Secondary | ICD-10-CM | POA: Insufficient documentation

## 2024-05-28 DIAGNOSIS — F4310 Post-traumatic stress disorder, unspecified: Secondary | ICD-10-CM | POA: Insufficient documentation

## 2024-05-28 DIAGNOSIS — O99345 Other mental disorders complicating the puerperium: Secondary | ICD-10-CM | POA: Insufficient documentation

## 2024-05-28 DIAGNOSIS — F319 Bipolar disorder, unspecified: Secondary | ICD-10-CM | POA: Insufficient documentation

## 2024-05-28 NOTE — Discharge Instructions (Addendum)
 Discharge recommendations:   Medications: Patient is to take medications as prescribed. The patient or patient's guardian is to contact a medical professional and/or outpatient provider to address any new side effects that develop. The patient or the patient's guardian should update outpatient providers of any new medications and/or medication changes.    Outpatient Follow up: Please review list of outpatient resources for psychiatry and counseling. Please follow up with your primary care provider for all medical related needs.    Therapy: We recommend that patient participate in individual therapy to address mental health concerns.   Safety:   The following safety precautions should be taken:   No sharp objects. This includes scissors, razors, scrapers, and putty knives.   Chemicals should be removed and locked up.   Medications should be removed and locked up.   Weapons should be removed and locked up. This includes firearms, knives and instruments that can be used to cause injury.   The patient should abstain from use of illicit substances/drugs and abuse of any medications.  If symptoms worsen or do not continue to improve or if the patient becomes actively suicidal or homicidal then it is recommended that the patient return to the closest hospital emergency department, the [facility], or call 911 for further evaluation and treatment. National Suicide Prevention Lifeline 1-800-SUICIDE or 6286627891.  About 988 988 offers 24/7 access to trained crisis counselors who can help people experiencing mental health-related distress. People can call or text 988 or chat 988lifeline.org for themselves or if they are worried about a loved one who may need crisis support.    You are encouraged to follow up with [facility] for outpatient treatment.  Walk in/ Open Access Hours: Monday - Friday 8AM - 10AM (To see provider) For walk-in  services, please arrive to the second floor by 7AM.   [facility] [address] [place], [place] [phone]
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
## 2024-06-16 ENCOUNTER — Ambulatory Visit (HOSPITAL_COMMUNITY): Admitting: Physician Assistant

## 2024-06-17 ENCOUNTER — Ambulatory Visit: Admitting: Obstetrics and Gynecology

## 2024-06-17 ENCOUNTER — Other Ambulatory Visit

## 2024-06-25 ENCOUNTER — Other Ambulatory Visit

## 2024-06-25 ENCOUNTER — Ambulatory Visit: Admitting: Obstetrics and Gynecology
